# Patient Record
Sex: Female | Born: 1950 | Race: Black or African American | Hispanic: No | Smoking: Former smoker
Health system: Southern US, Community
[De-identification: ages and names within clinical notes are randomized; demographics above are authoritative.]

## PROBLEM LIST (undated history)

## (undated) DIAGNOSIS — E785 Hyperlipidemia, unspecified: Secondary | ICD-10-CM

## (undated) DIAGNOSIS — I2510 Atherosclerotic heart disease of native coronary artery without angina pectoris: Secondary | ICD-10-CM

## (undated) DIAGNOSIS — E669 Obesity, unspecified: Secondary | ICD-10-CM

## (undated) DIAGNOSIS — R52 Pain, unspecified: Secondary | ICD-10-CM

## (undated) DIAGNOSIS — H3530 Unspecified macular degeneration: Secondary | ICD-10-CM

## (undated) DIAGNOSIS — N814 Uterovaginal prolapse, unspecified: Secondary | ICD-10-CM

## (undated) DIAGNOSIS — M47812 Spondylosis without myelopathy or radiculopathy, cervical region: Secondary | ICD-10-CM

## (undated) DIAGNOSIS — N816 Rectocele: Secondary | ICD-10-CM

## (undated) DIAGNOSIS — M5030 Other cervical disc degeneration, unspecified cervical region: Secondary | ICD-10-CM

## (undated) DIAGNOSIS — K5909 Other constipation: Secondary | ICD-10-CM

## (undated) DIAGNOSIS — K2270 Barrett's esophagus without dysplasia: Secondary | ICD-10-CM

## (undated) DIAGNOSIS — M1990 Unspecified osteoarthritis, unspecified site: Secondary | ICD-10-CM

## (undated) DIAGNOSIS — F329 Major depressive disorder, single episode, unspecified: Secondary | ICD-10-CM

## (undated) DIAGNOSIS — I10 Essential (primary) hypertension: Secondary | ICD-10-CM

## (undated) DIAGNOSIS — M5412 Radiculopathy, cervical region: Secondary | ICD-10-CM

## (undated) DIAGNOSIS — H35039 Hypertensive retinopathy, unspecified eye: Secondary | ICD-10-CM

## (undated) DIAGNOSIS — G4730 Sleep apnea, unspecified: Secondary | ICD-10-CM

## (undated) DIAGNOSIS — K219 Gastro-esophageal reflux disease without esophagitis: Secondary | ICD-10-CM

## (undated) HISTORY — DX: Rectocele: N81.6

## (undated) HISTORY — DX: Other cervical disc degeneration, unspecified cervical region: M50.30

## (undated) HISTORY — DX: Hyperlipidemia, unspecified: E78.5

## (undated) HISTORY — DX: Unspecified osteoarthritis, unspecified site: M19.90

## (undated) HISTORY — PX: EYE SURGERY: SHX253

## (undated) HISTORY — DX: Spondylosis without myelopathy or radiculopathy, cervical region: M47.812

## (undated) HISTORY — PX: BREAST EXCISIONAL BIOPSY: SUR124

## (undated) HISTORY — DX: Barrett's esophagus without dysplasia: K22.70

## (undated) HISTORY — DX: Unspecified macular degeneration: H35.30

## (undated) HISTORY — PX: TUBAL LIGATION: SHX77

## (undated) HISTORY — DX: Gastro-esophageal reflux disease without esophagitis: K21.9

## (undated) HISTORY — DX: Essential (primary) hypertension: I10

## (undated) HISTORY — DX: Obesity, unspecified: E66.9

## (undated) HISTORY — PX: CATARACT EXTRACTION: SUR2

## (undated) HISTORY — PX: TONSILLECTOMY: SUR1361

## (undated) HISTORY — DX: Uterovaginal prolapse, unspecified: N81.4

## (undated) HISTORY — DX: Major depressive disorder, single episode, unspecified: F32.9

## (undated) HISTORY — DX: Radiculopathy, cervical region: M54.12

## (undated) HISTORY — DX: Hypertensive retinopathy, unspecified eye: H35.039

## (undated) HISTORY — DX: Pain, unspecified: R52

## (undated) HISTORY — DX: Atherosclerotic heart disease of native coronary artery without angina pectoris: I25.10

## (undated) HISTORY — DX: Other constipation: K59.09

## (undated) HISTORY — DX: Sleep apnea, unspecified: G47.30

---

## 1978-03-15 HISTORY — PX: BREAST BIOPSY: SHX20

## 1979-03-16 HISTORY — PX: TOE SURGERY: SHX1073

## 1999-03-16 HISTORY — PX: SCAR REVISION: SHX5285

## 2000-03-15 HISTORY — PX: TOE SURGERY: SHX1073

## 2004-03-11 ENCOUNTER — Ambulatory Visit: Payer: Self-pay | Admitting: Internal Medicine

## 2004-03-17 ENCOUNTER — Ambulatory Visit: Payer: Self-pay | Admitting: Internal Medicine

## 2004-04-07 ENCOUNTER — Ambulatory Visit: Payer: Self-pay | Admitting: *Deleted

## 2004-04-23 ENCOUNTER — Ambulatory Visit: Payer: Self-pay | Admitting: Internal Medicine

## 2004-04-23 ENCOUNTER — Other Ambulatory Visit: Admission: RE | Admit: 2004-04-23 | Discharge: 2004-04-23 | Payer: Self-pay | Admitting: Internal Medicine

## 2004-04-29 ENCOUNTER — Ambulatory Visit (HOSPITAL_COMMUNITY): Admission: RE | Admit: 2004-04-29 | Discharge: 2004-04-29 | Payer: Self-pay | Admitting: Family Medicine

## 2005-01-15 ENCOUNTER — Ambulatory Visit (HOSPITAL_COMMUNITY): Admission: RE | Admit: 2005-01-15 | Discharge: 2005-01-15 | Payer: Self-pay | Admitting: Gastroenterology

## 2005-01-27 ENCOUNTER — Ambulatory Visit: Payer: Self-pay | Admitting: Internal Medicine

## 2005-02-09 ENCOUNTER — Ambulatory Visit: Payer: Self-pay | Admitting: Internal Medicine

## 2005-03-10 ENCOUNTER — Ambulatory Visit: Payer: Self-pay | Admitting: Internal Medicine

## 2005-03-10 ENCOUNTER — Ambulatory Visit (HOSPITAL_BASED_OUTPATIENT_CLINIC_OR_DEPARTMENT_OTHER): Admission: RE | Admit: 2005-03-10 | Discharge: 2005-03-10 | Payer: Self-pay | Admitting: Internal Medicine

## 2005-03-14 ENCOUNTER — Ambulatory Visit: Payer: Self-pay | Admitting: Internal Medicine

## 2005-04-08 ENCOUNTER — Ambulatory Visit: Payer: Self-pay | Admitting: Internal Medicine

## 2005-05-12 ENCOUNTER — Ambulatory Visit (HOSPITAL_COMMUNITY): Admission: RE | Admit: 2005-05-12 | Discharge: 2005-05-13 | Payer: Self-pay | Admitting: Otolaryngology

## 2005-05-12 ENCOUNTER — Encounter (INDEPENDENT_AMBULATORY_CARE_PROVIDER_SITE_OTHER): Payer: Self-pay | Admitting: Specialist

## 2005-10-26 ENCOUNTER — Emergency Department (HOSPITAL_COMMUNITY): Admission: EM | Admit: 2005-10-26 | Discharge: 2005-10-27 | Payer: Self-pay | Admitting: Emergency Medicine

## 2005-12-23 ENCOUNTER — Ambulatory Visit (HOSPITAL_COMMUNITY): Admission: RE | Admit: 2005-12-23 | Discharge: 2005-12-23 | Payer: Self-pay | Admitting: Internal Medicine

## 2006-02-13 ENCOUNTER — Encounter: Admission: RE | Admit: 2006-02-13 | Discharge: 2006-02-13 | Payer: Self-pay | Admitting: Family Medicine

## 2006-09-04 ENCOUNTER — Encounter: Admission: RE | Admit: 2006-09-04 | Discharge: 2006-09-04 | Payer: Self-pay | Admitting: Orthopaedic Surgery

## 2006-12-20 ENCOUNTER — Observation Stay (HOSPITAL_COMMUNITY): Admission: EM | Admit: 2006-12-20 | Discharge: 2006-12-21 | Payer: Self-pay | Admitting: Emergency Medicine

## 2007-02-08 ENCOUNTER — Ambulatory Visit (HOSPITAL_COMMUNITY): Admission: RE | Admit: 2007-02-08 | Discharge: 2007-02-08 | Payer: Self-pay | Admitting: Internal Medicine

## 2007-02-16 ENCOUNTER — Encounter: Admission: RE | Admit: 2007-02-16 | Discharge: 2007-02-16 | Payer: Self-pay | Admitting: Internal Medicine

## 2007-05-01 ENCOUNTER — Emergency Department (HOSPITAL_COMMUNITY): Admission: EM | Admit: 2007-05-01 | Discharge: 2007-05-01 | Payer: Self-pay | Admitting: Emergency Medicine

## 2007-05-24 ENCOUNTER — Ambulatory Visit: Payer: Self-pay | Admitting: Gastroenterology

## 2007-06-06 ENCOUNTER — Ambulatory Visit: Payer: Self-pay | Admitting: Gastroenterology

## 2007-08-16 ENCOUNTER — Encounter: Admission: RE | Admit: 2007-08-16 | Discharge: 2007-08-16 | Payer: Self-pay | Admitting: Internal Medicine

## 2007-10-31 ENCOUNTER — Telehealth: Payer: Self-pay | Admitting: Gastroenterology

## 2007-11-17 DIAGNOSIS — K5730 Diverticulosis of large intestine without perforation or abscess without bleeding: Secondary | ICD-10-CM | POA: Insufficient documentation

## 2008-02-09 ENCOUNTER — Encounter: Admission: RE | Admit: 2008-02-09 | Discharge: 2008-02-09 | Payer: Self-pay | Admitting: Internal Medicine

## 2008-06-20 ENCOUNTER — Ambulatory Visit: Payer: Self-pay | Admitting: Gastroenterology

## 2008-07-09 ENCOUNTER — Encounter: Payer: Self-pay | Admitting: Gastroenterology

## 2008-07-09 ENCOUNTER — Ambulatory Visit: Payer: Self-pay | Admitting: Gastroenterology

## 2008-07-09 ENCOUNTER — Ambulatory Visit (HOSPITAL_COMMUNITY): Admission: RE | Admit: 2008-07-09 | Discharge: 2008-07-09 | Payer: Self-pay | Admitting: Gastroenterology

## 2008-07-12 ENCOUNTER — Ambulatory Visit (HOSPITAL_COMMUNITY): Admission: RE | Admit: 2008-07-12 | Discharge: 2008-07-12 | Payer: Self-pay | Admitting: Gastroenterology

## 2008-07-16 ENCOUNTER — Encounter: Payer: Self-pay | Admitting: Gastroenterology

## 2008-07-22 ENCOUNTER — Emergency Department (HOSPITAL_COMMUNITY): Admission: EM | Admit: 2008-07-22 | Discharge: 2008-07-22 | Payer: Self-pay | Admitting: Emergency Medicine

## 2008-08-07 ENCOUNTER — Ambulatory Visit: Payer: Self-pay | Admitting: Gastroenterology

## 2008-08-07 DIAGNOSIS — K219 Gastro-esophageal reflux disease without esophagitis: Secondary | ICD-10-CM | POA: Insufficient documentation

## 2008-08-07 DIAGNOSIS — R059 Cough, unspecified: Secondary | ICD-10-CM | POA: Insufficient documentation

## 2008-08-07 DIAGNOSIS — R05 Cough: Secondary | ICD-10-CM

## 2009-02-12 ENCOUNTER — Encounter: Admission: RE | Admit: 2009-02-12 | Discharge: 2009-02-12 | Payer: Self-pay | Admitting: Internal Medicine

## 2009-04-25 ENCOUNTER — Telehealth: Payer: Self-pay | Admitting: Gastroenterology

## 2009-05-30 ENCOUNTER — Encounter (INDEPENDENT_AMBULATORY_CARE_PROVIDER_SITE_OTHER): Payer: Self-pay | Admitting: *Deleted

## 2009-06-24 ENCOUNTER — Encounter (INDEPENDENT_AMBULATORY_CARE_PROVIDER_SITE_OTHER): Payer: Self-pay | Admitting: *Deleted

## 2009-07-10 ENCOUNTER — Ambulatory Visit: Payer: Self-pay | Admitting: Gastroenterology

## 2009-08-05 ENCOUNTER — Emergency Department (HOSPITAL_COMMUNITY): Admission: EM | Admit: 2009-08-05 | Discharge: 2009-08-06 | Payer: Self-pay | Admitting: Emergency Medicine

## 2009-08-07 ENCOUNTER — Ambulatory Visit: Payer: Self-pay | Admitting: Gastroenterology

## 2009-08-07 DIAGNOSIS — K2270 Barrett's esophagus without dysplasia: Secondary | ICD-10-CM | POA: Insufficient documentation

## 2009-08-20 ENCOUNTER — Ambulatory Visit: Payer: Self-pay | Admitting: Gastroenterology

## 2009-08-26 ENCOUNTER — Encounter: Payer: Self-pay | Admitting: Gastroenterology

## 2009-08-27 ENCOUNTER — Ambulatory Visit: Payer: Self-pay | Admitting: Pulmonary Disease

## 2009-08-27 DIAGNOSIS — F329 Major depressive disorder, single episode, unspecified: Secondary | ICD-10-CM | POA: Insufficient documentation

## 2009-08-27 DIAGNOSIS — R0602 Shortness of breath: Secondary | ICD-10-CM | POA: Insufficient documentation

## 2009-08-27 DIAGNOSIS — E785 Hyperlipidemia, unspecified: Secondary | ICD-10-CM | POA: Insufficient documentation

## 2009-08-27 DIAGNOSIS — I10 Essential (primary) hypertension: Secondary | ICD-10-CM | POA: Insufficient documentation

## 2009-08-27 DIAGNOSIS — G4730 Sleep apnea, unspecified: Secondary | ICD-10-CM | POA: Insufficient documentation

## 2010-02-27 ENCOUNTER — Encounter
Admission: RE | Admit: 2010-02-27 | Discharge: 2010-02-27 | Payer: Self-pay | Source: Home / Self Care | Attending: Family Medicine | Admitting: Family Medicine

## 2010-04-05 ENCOUNTER — Encounter: Payer: Self-pay | Admitting: Orthopaedic Surgery

## 2010-04-14 NOTE — Miscellaneous (Signed)
Summary: Lec previsit  Clinical Lists Changes  Appended Document: Lec previsit During previsit, pt states she would like to have "both procedures done at the same time."  Last colonoscopy was 05-2007.  Writer told pt that she would need to schedule an office appt to speak with Dr. Arlyce Dice re: having both procedures done.  Pt states that she would rather see him before having endoscopy done.  REV made for 08-07-09 at 9:45 a.m.  Pt agreeable to this.

## 2010-04-14 NOTE — Assessment & Plan Note (Signed)
Summary: STUDY F/U.Marland KitchenMarland KitchenAS.    History of Present Illness Visit Type: Follow-up Visit Primary GI MD: Melvia Heaps MD [facility] Primary Provider: Guerry Bruin, MD Chief Complaint: Study F/U Kylie Wilson has returned on upper endoscopy and esophageal dilatation.  Dysphagia is significantly improved.  She still complains of occasional pyrosis despite taking Nexium 40 mg twice a day.  Her main complaint is persistent nocturnal cough and hoarseness.  A gastric ulcer was seen as well.  The patient had been taking Goody powder on a regular basis.  She is concerned about a vocal cord polyp.  Gastric emptying scan was within normal limits.   GI Review of Systems      Denies abdominal pain, acid reflux, belching, bloating, chest pain, dysphagia with liquids, dysphagia with solids, heartburn, loss of appetite, nausea, vomiting, vomiting blood, weight loss, and  weight gain.        Denies anal fissure, black tarry stools, change in bowel habit, constipation, diarrhea, diverticulosis, fecal incontinence, heme positive stool, hemorrhoids, irritable bowel syndrome, jaundice, light color stool, liver problems, rectal bleeding, and  rectal pain.    Current Medications (verified): 1)  Metformin Hcl 500 Mg Tabs (Metformin Hcl) .... One Tablet By Mouth Once Daily 2)  Simvastatin 40 Mg Tabs (Simvastatin) .... One Tablet By Mouth Once Daily 3)  Nexium 40 Mg Cpdr (Esomeprazole Magnesium) .... Take 1 Capsule Two Times A Day 4)  Lisinopril 10 Mg Tabs (Lisinopril) .... One Tablet By Mouth Once Daily 5)  Bc Fast Pain Relief Arthritis 742-222-38 Mg Pack (Aspirin-Salicylamide-Caffeine) .... As Needed For Arthritis 6)  Amlodipine Besylate 5 Mg Tabs (Amlodipine Besylate) .... One Tablet By Mouth Once Daily 7)  Meloxicam 15 Mg Tabs (Meloxicam) .... Once Daily  Allergies (verified): No Known Drug Allergies  Past History:  Past Medical History: Reviewed history from 05/24/2007 and no changes  required. constipation Hypertension Diabetes DJD Depression Sleep Apnea  Past Surgical History: Tubal Ligation Breast Surgery Foot Surgery Tonsillectomy  Family History: Reviewed history from 06/20/2008 and no changes required. Family History of Breast Cancer:Aunt Family History of Stomach Cancer:Grandmother Family History of Pancreatic Cancer:Father No FH of Colon Cancer:  Social History: Married Patient currently smokes. 4 cigs weekly Alcohol Use - no Daily Caffeine Use Illicit Drug Use - no Occupation: Retired  Review of Systems       The patient complains of arthritis/joint pain, back pain, change in vision, cough, depression-new, fatigue, muscle pains/cramps, shortness of breath, skin rash, urination changes/pain, vision changes, and voice change.    Vital Signs:  Patient profile:   60 year old female Height:      64 inches Weight:      258.38 pounds BMI:     44.51 Pulse rate:   72 / minute Pulse rhythm:   regular BP sitting:   112 / 80  (right arm) Cuff size:   large  Vitals Entered By: June McMurray CMA (Aug 07, 2008 10:25 AM)   Impression & Recommendations:  Problem # 1:  COUGH (ICD-786.2) I suspect that her chronic cough could be related to acid reflux.  If this is so then  she is having continued symptoms despite high-dose PPI therapy.  Recommendations #1 continue Nexium twice a day #2 antireflux measures #3 add baclofen 10 mg t.i.d.  Problem # 2:  ESOPHAGEAL REFLUX (ICD-530.81) Symptoms  are  only partially controlled with PPI therapy.  Plan as per assessment #1  Patient Instructions: 1)  cc Guerry Bruin, M.D. 2)  GI Reflux brochure  given.  Prescriptions: BACLOFEN 10 MG TABS (BACLOFEN) take one tab t.i.d.  #45 x 2   Entered and Authorized by:   Louis Meckel MD   Signed by:   Louis Meckel MD on 08/07/2008   Method used:   Historical   RxID:   [id number]

## 2010-04-14 NOTE — Progress Notes (Signed)
Summary: Triage   Phone Note From Other Clinic Call back at [phone]   Caller: Debbie,front office Call For: Dr. Arlyce Dice Reason for Call: Schedule Patient Appt Summary of Call: Dr. Dione Booze needs a referral reason for pt... PT IS AT DR. Collier Salina OFFICE RIGHT NOW and they would like the referral asap Initial call taken by: Vallarie Mare,  April 25, 2009 9:45 AM  Follow-up for Phone Call        Dr.Groat is an Opthamologist. The pt. is there to be seen, states Dr.Mckynleigh Mussell referred her and they need a referral. The last time pt. saw Dr.Brevin Mcfadden was 07/2008, no indication in that note we referred her. I advised them to contact pt. PCP.  Follow-up by: Laureen Ochs LPN,  April 25, 2009 9:51 AM

## 2010-04-14 NOTE — Procedures (Signed)
Summary: Upper Endoscopy  Patient: Kylie Wilson Note: All result statuses are Final unless otherwise noted.  Tests: (1) Upper Endoscopy (EGD)   EGD Upper Endoscopy       DONE     [facility]     [address]     [place], [place]  [address]           ENDOSCOPY PROCEDURE REPORT           PATIENT:  Kylie Wilson, Kylie Wilson  MR#:  [id number]     BIRTHDATE:  [birth year], 59 yrs. old  GENDER:  female           ENDOSCOPIST:  Barbette Hair. Arlyce Dice, MD     Referred by:           PROCEDURE DATE:  08/20/2009     PROCEDURE:  EGD with biopsy     ASA CLASS:  Class II     INDICATIONS:  h/o Barrett's Esophagus           MEDICATIONS:   Fentanyl 75 mcg IV, Versed 7 mg IV, glycopyrrolate     (Robinal) 0.2 mg IV, 0.6cc simethancone 0.6 cc PO     TOPICAL ANESTHETIC:  Exactacain Spray           DESCRIPTION OF PROCEDURE:   After the risks benefits and     alternatives of the procedure were thoroughly explained, informed     consent was obtained.  The LB GIF-H180 [id number] endoscope was     introduced through the mouth and advanced to the third portion of     the duodenum, without limitations.  The instrument was slowly     withdrawn as the mucosa was fully examined.     <<PROCEDUREIMAGES>>           Barrett's esophagus was found at the gastroesophageal junction.     Established Barrett's extending 3cm proximally. Multiple biopsies     were taken (see image1, image4, and image5).  A stricture was     found at the gastroesophageal junction. Early esophageal stricture     (see image3).  A hiatal hernia was found. 3cm sliding hiatal     hernia  other findings.    Retroflexed views revealed no     abnormalities.    The scope was then withdrawn from the patient     and the procedure completed.           COMPLICATIONS:  None           ENDOSCOPIC IMPRESSION:     1) Barrett's esophagus at the gastroesophageal junction     2) Stricture at the gastroesophageal junction     3) Hiatal hernia     4) Other  findings     RECOMMENDATIONS:     1) Await biopsy results     2) esophageal dilitation if pt develops dysphagia           REPEAT EXAM:   You will receive a letter from Dr. Arlyce Dice in 1-2     weeks, after reviewing the final pathology, with followup     recommendations.           ______________________________     Barbette Hair Arlyce Dice, MD           CC:  Guerry Bruin, MD           n.     Rosalie Doctor:   Barbette Hair. Kaplan at 08/20/2009 04:03 PM  Venia, [place], [phone]  Note: An exclamation mark (!) indicates a result that was not dispersed into the flowsheet. Document Creation Date: 08/20/2009 4:03 PM _______________________________________________________________________  (1) Order result status: Final Collection or observation date-time: 08/20/2009 15:51 Requested date-time:  Receipt date-time:  Reported date-time:  Referring Physician:   Ordering Physician: Melvia Heaps [phone]) Specimen Source:  Source: Launa Grill Order Number: [id number] Lab site:   Appended Document: Upper Endoscopy     Procedures Next Due Date:    EGD: 08/2011

## 2010-04-14 NOTE — Letter (Signed)
Summary: Patient Notice-[facility]  [address] [place], [place] [address]   Phone: [phone]  Fax: [phone]        Jul 16, 2008 MRN: [id number]    Kylie Wilson [address] [place], [place]  [address]    Dear Ms. CANCELLIERE,  I am pleased to inform you that the biopsies taken during your recent endoscopic examination did not show any evidence of cancer upon pathologic examination.  However, your biopsies indicate you have a condition known as Barrett's esophagus. While not cancer, it is pre-cancerous (can progress to cancer) and needs to be monitored with repeat endoscopic examination and biopsies.  Fortunately, it is quite rare that this develops into cancer, but careful monitoring of the condition along with taking your medication as prescribed is important in reducing the risk of developing cancer.  It is my recommendation that you have a repeat upper gastrointestinal endoscopic examination in 1_ years.  Additional information/recommendations:  __Please call [phone] to schedule a return visit to further      evaluate your condition.  _x_Continue with treatment plan as outlined the day of your exam.  Please call us if you have or develop heartburn, reflux symptoms, any swallowing problems, or if you have questions about your condition that have not been fully answered at this time.  Sincerely,  Louis Meckel MD  This letter has been electronically signed by your physician.  Appended Document: Patient Notice-Barrett's Esopghagus Letter mailed to patient. Endo. recall is in IDX for 06/2009.

## 2010-04-14 NOTE — Miscellaneous (Signed)
Summary: Waiver of Liability/[place] [employer], industrial/product of Liability/[facility]   Imported By: Lester [place] 06/21/2008 07:54:51  _____________________________________________________________________  External Attachment:    Type:   Image     Comment:   External Document

## 2010-04-14 NOTE — Letter (Signed)
Summary: Endoscopy Letter  [facility]  [address] [place], [place] [address]   Phone: [phone]  Fax: [phone]      May 30, 2009 MRN: [id number]   Kylie Wilson [address] [place], [place]  [address]   Dear [name],   According to your medical record, it is time for you to schedule an Endoscopy. Endoscopic screening is recommended for patients with certain upper digestive tract conditions because of associated increased risk for cancers of the upper digestive system.  This letter has been generated based on the recommendations made at the time of your prior procedure. If you feel that in your particular situation this may no longer apply, please contact our office.  Please call our office at [phone]) to schedule this appointment or to update your records at your earliest convenience.  Thank you for cooperating with [place] to provide you with the very best care possible.   Sincerely,  Barbette Hair. Arlyce Dice, M.D.  [facility] [phone]

## 2010-04-14 NOTE — Assessment & Plan Note (Signed)
Summary: wheezing/self ref/apc   Visit Type:  Initial Consult Copy to:  self Primary Provider/Referring Provider:  Guerry Bruin, MD  CC:  Pt c/o chest pain x 1 month, wheezing, cough, and SOB all the time.  History of Present Illness: 59/F, 1 pPD smoker for evaluation of dyspnea, cough & hoarseness since dec 2010. She quit smoking in '99 but resumed due to psychological stress ( death of her mother etc) & is now upto 1PPD. She developed chest pain on lying down & underwent  ER evaluation . CXR 5/25 /11 nml She repors a dry nocturnal cough . She is limited in her walking by arthritis but has joined the [employer] to start an exercise program. She has been on lisinopril x 5 yrs. Med list includes metformin but hs edoes not take this. She ahs seen dr bates who has observed 'a polyp' on her vocal cords but does not feel it needs to be removed. Spirometry surprisingly showed nml lung function  Preventive Screening-Counseling & Management  Alcohol-Tobacco     Alcohol drinks/day: 1     Alcohol type: wine     Smoking Status: current     Packs/Day: 1.0     Year Started: 1964  Allergies (verified): No Known Drug Allergies  Past History:  Past Medical History: constipation DJD SLEEP APNEA (ICD-780.57) HYPERTENSION (ICD-401.9) DEPRESSION (ICD-311) HYPERLIPIDEMIA (ICD-272.4) BARRETTS ESOPHAGUS (ICD-530.85) ESOPHAGEAL REFLUX (ICD-530.81) COUGH (ICD-786.2) DIVERTICULOSIS OF COLON (ICD-562.10)    Past Surgical History: Tubal Ligation Breast Surgery Foot Surgery Tonsillectomy Bilateral scar revision on legs  Social History: Married, lives alone husband is in nursing home Alcohol Use - no Daily Caffeine Use Illicit Drug Use - no Occupation: Retired Writer Patient is a current smoker.  (1ppd 1964 on and off)Packs/Day:  1.0 Alcohol drinks/day:  1  Review of Systems       The patient complains of shortness of breath with activity, shortness of breath at rest, productive  cough, non-productive cough, chest pain, irregular heartbeats, acid heartburn, indigestion, loss of appetite, weight change, abdominal pain, tooth/dental problems, anxiety, depression, hand/feet swelling, joint stiffness or pain, and rash.  The patient denies coughing up blood, difficulty swallowing, sore throat, headaches, nasal congestion/difficulty breathing through nose, sneezing, itching, ear ache, change in color of mucus, and fever.    Vital Signs:  Patient profile:   60 year old female Height:      64 inches Weight:      265 pounds BMI:     45.65 O2 Sat:      98 % on Room air Temp:     98.6 degrees F oral Pulse rate:   67 / minute BP sitting:   140 / 82  (right arm) Cuff size:   large  Vitals Entered By: Zackery Barefoot CMA (August 27, 2009 9:45 AM)  O2 Flow:  Room air CC: Pt c/o chest pain x 1 month, wheezing, cough, SOB all the time Comments Medications reviewed with patient Verified contact number and pharmacy with patient Zackery Barefoot CMA  August 27, 2009 9:45 AM    Physical Exam  Additional Exam:  Gen. Pleasant, well-nourished, in no distress, normal affect ENT - no lesions, no post nasal drip, class 2 airway Neck: No JVD, no thyromegaly, no carotid bruits Lungs: no use of accessory muscles, no dullness to percussion, clear without rales or rhonchi  Cardiovascular: Rhythm regular, heart sounds  normal, no murmurs or gallops, no peripheral edema Abdomen: soft and non-tender, no hepatosplenomegaly, BS normal. Musculoskeletal: No deformities,  no cyanosis or clubbing Neuro:  alert, non focal     Pulmonary Function Test Date: 08/27/2009 10:19 AM Gender: Female  Pre-Spirometry FVC    Value: 2.41 L/min   % Pred: 90.50 % FEV1    Value: 1.91 L     Pred: 2.10 L     % Pred: 91.40 % FEV1/FVC  Value: 79.52 %     % Pred: 99.90 %  Impression & Recommendations:  Problem # 1:  DYSPNEA (ICD-786.05) Spirometry does  not show significant airway obstruction. Clearly smoking  cessation paramount here - this was discussed  - she does not desire aids here. CXR today Orders: Consultation Level IV (66063) Spirometry w/Graph (94010)  Problem # 2:  COUGH (ICD-786.2) stop lisinopril trial of diovan 80 - rechk BP in 1 wk & on FU. Hoarseness - has been evaluated by dr bates. Orders: T-2 View CXR (71020TC) Consultation Level IV (01601) Spirometry w/Graph (94010)  Patient Instructions: 1)  Copy sent to: Dr Wylene Simmer 2)  Please schedule a follow-up appointment in 1 month with TP 3)  Breathing test 4)  Albuterol MDI sample 2 puffs q 6h as needed  5)  STOP lisinopril  6)  Use valsartan 80 mg once daily instead - samples x 2 weeks, recheck your BP in 1 week  7)  STOP smoking   Card[facility] Spirometry  ID: [id number] Patient: Kylie Wilson, Kylie Wilson DOB: [birth year] Age: 60 Years Old Sex: Female Race: Black Height: 64 Weight: 265 PPD: 1.0 Status: Confirmed Past Medical History:  constipation Hypertension Diabetes DJD Depression Sleep Apnea Recorded: 08/27/2009 10:19 AM  Parameter  Measured Predicted %Predicted FVC     2.41        2.66        90.50 FEV1     1.91        2.10        91.40 FEV1%   79.52        79.62        99.90 PEF    5.08        5.63        90.20   Interpretation: Within normal limits

## 2010-04-14 NOTE — Letter (Signed)
Summary: EGD Instructions  [facility]  [address] [place], [place] [address]   Phone: [phone]  Fax: [phone]       Kylie Wilson    01-14-1951    MRN: [id number]       Procedure Day /Date:WEDNESDAY 08/20/2009     Arrival Time: 3PM     Procedure Time:4PM     Location of Procedure:                    X  [facility] (4th Floor)    PREPARATION FOR ENDOSCOPY   On6/10/2009  THE DAY OF THE PROCEDURE:  1.   No solid foods, milk or milk products are allowed after midnight the night before your procedure.  2.   Do not drink anything colored red or purple.  Avoid juices with pulp.  No orange juice.  3.  You may drink clear liquids until2PM, which is 2 hours before your procedure.                                                                                                CLEAR LIQUIDS INCLUDE: Water Jello Ice Popsicles Tea (sugar ok, no milk/cream) Powdered fruit flavored drinks Coffee (sugar ok, no milk/cream) Gatorade Juice: apple, white grape, white cranberry  Lemonade Clear bullion, consomm, broth Carbonated beverages (any kind) Strained chicken noodle soup Hard Candy   MEDICATION INSTRUCTIONS  Unless otherwise instructed, you should take regular prescription medications with a small sip of water as early as possible the morning of your procedure.  Diabetic patients - see separate instructions.            OTHER INSTRUCTIONS  You will need a responsible adult at least 60 years of age to accompany you and drive you home.   This person must remain in the waiting room during your procedure.  Wear loose fitting clothing that is easily removed.  Leave jewelry and other valuables at home.  However, you may wish to bring a book to read or an iPod/MP3 player to listen to music as you wait for your procedure to start.  Remove all body piercing jewelry and leave at home.  Total time from sign-in until discharge is approximately  2-3 hours.  You should go home directly after your procedure and rest.  You can resume normal activities the day after your procedure.  The day of your procedure you should not:   Drive   Make legal decisions   Operate machinery   Drink alcohol   Return to work  You will receive specific instructions about eating, activities and medications before you leave.    The above instructions have been reviewed and explained to me by   _______________________    I fully understand and can verbalize these instructions _____________________________ Date _________

## 2010-04-14 NOTE — Assessment & Plan Note (Signed)
Summary: YEARLY F-UP/YF    History of Present Illness Visit Type: Follow-up Visit Primary GI MD: Melvia Heaps MD [facility] Primary Provider: Guerry Bruin, MD Kylie Wilson has returned for evaluation of dysphagia.  She has developed dysphagia to both solids and liquids..  She reports abdominal bloating, nausea and some pyrosis.  She was also informed that she has a polyp in her throat as determined by her ENT physician, Dr. Jenne Pane.   GI Review of Systems    Reports dysphagia with liquids and  dysphagia with solids.      Denies abdominal pain, acid reflux, belching, bloating, chest pain, heartburn, loss of appetite, nausea, vomiting, vomiting blood, weight loss, and  weight gain.        Denies anal fissure, black tarry stools, change in bowel habit, constipation, diarrhea, diverticulosis, fecal incontinence, heme positive stool, hemorrhoids, irritable bowel syndrome, jaundice, light color stool, liver problems, rectal bleeding, and  rectal pain. Preventive Screening-Counseling & Management     Smoking Status: current      Drug Use:  no.      Current Medications (verified): 1)  Metformin Hcl 500 Mg Tabs (Metformin Hcl) .... One Tablet By Mouth Once Daily 2)  Simvastatin 40 Mg Tabs (Simvastatin) .... One Tablet By Mouth Once Daily 3)  Pantoprazole Sodium 40 Mg Tbec (Pantoprazole Sodium) .... One Tablet By Mouth Two Times A Day 4)  Lisinopril 10 Mg Tabs (Lisinopril) .... One Tablet By Mouth Once Daily 5)  Bc Fast Pain Relief Arthritis [id number] Mg Pack (Aspirin-Salicylamide-Caffeine) .... As Needed For Arthritis 6)  Amlodipine Besylate 5 Mg Tabs (Amlodipine Besylate) .... One Tablet By Mouth Once Daily  Allergies (verified): No Known Drug Allergies  Past History:  Past Medical History:    Reviewed history from 05/24/2007 and no changes required:    constipation    Hypertension    Diabetes    DJD    Depression    Sleep Apnea  Past Surgical History:    Reviewed history from  05/24/2007 and no changes required:    Tubal Ligation    Breast Surgery  Family History:    Family History of Breast Cancer:Aunt    Family History of Stomach Cancer:Grandmother    Family History of Pancreatic Cancer:Father    No FH of Colon Cancer:  Social History:    Married    Patient currently smokes.     Alcohol Use - no    Daily Caffeine Use    Illicit Drug Use - no    Smoking Status:  current    Drug Use:  no  Review of Systems  The patient denies allergy/sinus, anemia, anxiety-new, arthritis/joint pain, back pain, blood in urine, breast changes/lumps, change in vision, confusion, cough, coughing up blood, depression-new, fainting, fatigue, fever, headaches-new, hearing problems, heart murmur, heart rhythm changes, itching, menstrual pain, muscle pains/cramps, night sweats, nosebleeds, pregnancy symptoms, shortness of breath, skin rash, sleeping problems, sore throat, swelling of feet/legs, swollen lymph glands, thirst - excessive , urination - excessive , urination changes/pain, urine leakage, vision changes, and voice change.    Vital Signs:  Patient profile:   60 year old female Height:      64 inches Weight:      259.38 pounds BMI:     44.68 Pulse rate:   76 / minute Pulse rhythm:   regular BP sitting:   142 / 80  (right arm) Cuff size:   large  Vitals Entered By: Christie Nottingham CMA (June 20, 2008 11:51  AM)  Physical Exam  Additional Exam:  She is a well-developed well-nourished female  skin: anicteric HEENT: normocephalic; PEERLA; no nasal or pharyngeal abnormalities neck: supple nodes: no cervical lymphadenopathy chest: clear to ausculatation and percussion heart: no murmurs, gallops, or rubs abd: soft, nontender; BS normoactive; no abdominal masses, tenderness, organomegaly; there is no succussion splash rectal: deferred ext: no cynanosis, clubbing, edema skeletal: no deformities neuro: oriented x 3; no focal abnormalities    Impression &  Recommendations:  Problem # 1:  DYSPHAGIA UNSPECIFIED (ICD-787.20)  I suspect that she has an  esophageal stricture.  Recommendations #1 upper endoscopy with dilatation as indicated  Orders: ZEGD (ZEGD) Gastric Emptying Scan (GES)  Problem # 2:  NAUSEA ALONE (ICD-787.02) Rule out gastroparesis  Recommendations #1 gastric emptying scan  Orders: Gastric Emptying Scan (GES)  Patient Instructions: 1)  cc Guerry Bruin, M.D. 2)  cc Dr. Jenne Pane 3)  Your EGD is scheduled for 07/09/2008 at [facility] 4)  Your GES is scheduled for 07/12/2008 at 8:00am 5)  The medication list was reviewed and reconciled.  All changed / newly prescribed medications were explained.  A complete medication list was provided to the patient / caregiver.

## 2010-04-14 NOTE — Procedures (Signed)
Summary: ENDO Prep/[facility]  ENDO Prep/[facility]   Imported By: Lester [place] 06/21/2008 07:53:23  _____________________________________________________________________  External Attachment:    Type:   Image     Comment:   External Document

## 2010-04-14 NOTE — Procedures (Signed)
Summary: colonoscopy   Colonoscopy  Procedure date:  06/06/2007  Findings:      Results: Diverticulosis.       Location:  [facility].   Patient Name: Kylie Wilson, Kylie Wilson MRN:  Procedure Procedures: Colonoscopy CPT: 83382.  Personnel: Endoscopist: Barbette Hair. Arlyce Dice, MD. Cecum to Rectum. Comments: otherwise normal examination.  Exam Location: Outpatient  Patient Consent: Procedure, Alternatives, Risks and Benefits discussed, consent obtained, from patient.  Indications Symptoms: Abdominal pain / bloating. Change in bowel habits.  History  Current Medications: Patient is not currently taking Coumadin.  Pre-Exam Physical: Performed Jun 06, 2007. Cardio-pulmonary exam, HEENT exam , Abdominal exam, Mental status exam WNL.  Comments: Patient history reviewed/updated, physical performed prior to initiation of sedation?yes Exam Exam: Extent of exam reached: Ileum, extent intended: Cecum.  The cecum was identified by appendiceal orifice and IC valve. Time to Cecum: 00:03: 21. Time for Withdrawl: 00:05:15. Colon retroflexion performed. ASA Classification: II. Tolerance: good.  Monitoring: Pulse and BP monitoring, Oximetry used. Supplemental O2 given. at 2 Liters.  Colon Prep Used Miralax for colon prep. Prep results: good.  Sedation Meds: Patient assessed and found to be appropriate for moderate (conscious) sedation. Sedation was managed by the Endoscopist. Fentanyl 75 mcg. given IV. Versed 9 mg. given IV.  Findings - DIVERTICULOSIS: Descending Colon. ICD9: Diverticulosis: 562.10. Comments: Scattered diverticula.  - NORMAL EXAM: Cecum to Splenic Flexure.  NORMAL EXAM: Cecum.  - NORMAL EXAM: Sigmoid Colon to Rectum.   Assessment Abnormal examination, see findings above.  Diagnoses: 562.10: Diverticulosis.   Events  Unplanned Interventions: No intervention was required.  Unplanned Events: There were no complications. Plans  Post Exam  Instructions: Post sedation instructions given.  Medication Plan: Fiber supplements: Bran 1 Tbsp QD, starting Jun 06, 2007   Patient Education: Patient given standard instructions for: Diverticulosis.  Disposition: After procedure patient sent to recovery. After recovery patient sent home.  Scheduling/Referral: Office Visit, to [employer]. Arlyce Dice, MD, around Jun 27, 2007.    This report was created from the original endoscopy report, which was reviewed and signed by the above listed endoscopist.

## 2010-04-14 NOTE — Letter (Signed)
Summary: Diabetic Instructions  [facility]  [address] [place], [place] [address]   Phone: [phone]  Fax: [phone]    FERRIN LIEBIG 1950-04-17 MRN: [id number]   X    ORAL DIABETIC MEDICATION INSTRUCTIONS                                           METFORMIN The day before your procedure:   Take your diabetic pill as you do normally  The day of your procedure:   Do not take your diabetic pill    We will check your blood sugar levels during the admission process and again in Recovery before discharging you home  ________________________________________________________________________  _  _   INSULIN (LONG ACTING) MEDICATION INSTRUCTIONS (Lantus, NPH, 70/30, Humulin, Novolin-N)   The day before your procedure:   Take  your regular evening dose    The day of your procedure:   Do not take your morning dose    _  _   INSULIN (SHORT ACTING) MEDICATION INSTRUCTIONS (Regular, Humulog, Novolog)   The day before your procedure:   Do not take your evening dose   The day of your procedure:   Do not take your morning dose   _  _   INSULIN PUMP MEDICATION INSTRUCTIONS  We will contact the physician managing your diabetic care for written dosage instructions for the day before your procedure and the day of your procedure.  Once we have received the instructions, we will contact you.

## 2010-04-14 NOTE — Letter (Signed)
Summary: Patient Notice-[facility]  [address] [place], [place] [address]   Phone: [phone]  Fax: [phone]        August 26, 2009 MRN: [id number]    Kylie Wilson [address] [place], [place]  [address]    Dear Ms. BLACKSTON,  I am pleased to inform you that the biopsies taken during your recent endoscopic examination did not show any evidence of cancer upon pathologic examination.  However, your biopsies indicate you have a condition known as Barrett's esophagus. While not cancer, it is pre-cancerous (can progress to cancer) and needs to be monitored with repeat endoscopic examination and biopsies.  Fortunately, it is quite rare that this develops into cancer, but careful monitoring of the condition along with taking your medication as prescribed is important in reducing the risk of developing cancer.  It is my recommendation that you have a repeat upper gastrointestinal endoscopic examination in 2_ years.  Additional information/recommendations:  __Please call [phone] to schedule a return visit to further      evaluate your condition.  __xContinue with treatment plan as outlined the day of your exam.  Please call us if you have or develop heartburn, reflux symptoms, any swallowing problems, or if you have questions about your condition that have not been fully answered at this time.  Sincerely,  Louis Meckel MD  This letter has been electronically signed by your physician.  Appended Document: Patient Notice-Barrett's Esopghagus letter mailed.

## 2010-04-14 NOTE — Letter (Signed)
Summary: Previsit letter  [facility]  [address] [place], [place] [address]   Phone: [phone]  Fax: [phone]       06/24/2009 MRN: [id number]  Kylie Wilson [address] [place], [place]  [address]  Dear Ms. KIPPER,  Welcome to the Gastroenterology Division at [facility].    You are scheduled to see a nurse for your pre-procedure visit on 07-10-09 at 10:30a.m. on the 3rd floor at [facility], [address]  We ask that you try to arrive at our office 15 minutes prior to your appointment time to allow for check-in.  Your nurse visit will consist of discussing your medical and surgical history, your immediate family medical history, and your medications.    Please bring a complete list of all your medications or, if you prefer, bring the medication bottles and we will list them.  We will need to be aware of both prescribed and over the counter drugs.  We will need to know exact dosage information as well.  If you are on blood thinners (Coumadin, Plavix, Aggrenox, Ticlid, etc.) please call our office today/prior to your appointment, as we need to consult with your physician about holding your medication.   Please be prepared to read and sign documents such as consent forms, a financial agreement, and acknowledgement forms.  If necessary, and with your consent, a friend or relative is welcome to sit-in on the nurse visit with you.  Please bring your insurance card so that we may make a copy of it.  If your insurance requires a referral to see a specialist, please bring your referral form from your primary care physician.  No co-pay is required for this nurse visit.     If you cannot keep your appointment, please call [phone] to cancel or reschedule prior to your appointment date.  This allows [place] the opportunity to schedule an appointment for another patient in need of care.    Thank you for choosing [facility] for your medical  needs.  We appreciate the opportunity to care for you.  Please visit [place] at our website  to learn more about our practice.                     Sincerely.                                                                                                                   The Gastroenterology Division

## 2010-04-14 NOTE — Assessment & Plan Note (Signed)
Summary: rev/kw    History of Present Illness Visit Type: Follow-up Visit Primary GI MD: Melvia Heaps MD [facility] Primary Provider: Guerry Bruin, MD Requesting Provider: na Chief Complaint: Discuss endo/colon. Pt c/o constipation  History of Present Illness:   Ms. Silberstein has returned for followup of abdominal discomfort.  She has diverticulosis as demonstrated by colonoscopy in 2009.  She complains of constipation and lower abdominal discomfort.  It is partially relieved when she moves her bowels.  Barrett's esophagus was diagnosed a year ago.  She remains on Nexium.  She takes [facility] powder several times a day for arthritic pain.   GI Review of Systems      Denies abdominal pain, acid reflux, belching, bloating, chest pain, dysphagia with liquids, dysphagia with solids, heartburn, loss of appetite, nausea, vomiting, vomiting blood, weight loss, and  weight gain.      Reports constipation.     Denies anal fissure, black tarry stools, change in bowel habit, diarrhea, diverticulosis, fecal incontinence, heme positive stool, hemorrhoids, irritable bowel syndrome, jaundice, light color stool, liver problems, rectal bleeding, and  rectal pain.    Current Medications (verified): 1)  Metformin Hcl 500 Mg Tabs (Metformin Hcl) .... One Tablet By Mouth Once Daily 2)  Simvastatin 40 Mg Tabs (Simvastatin) .... One Tablet By Mouth Once Daily 3)  Nexium 40 Mg Cpdr (Esomeprazole Magnesium) .... Take 1 Capsule Two Times A Day 4)  Lisinopril 10 Mg Tabs (Lisinopril) .... One Tablet By Mouth Once Daily 5)  Bc Fast Pain Relief Arthritis [phone] Mg Pack (Aspirin-Salicylamide-Caffeine) .... As Needed For Arthritis 6)  Amlodipine Besylate 5 Mg Tabs (Amlodipine Besylate) .... One Tablet By Mouth Once Daily 7)  Hydrochlorothiazide 25 Mg Tabs (Hydrochlorothiazide) .... 1/2 Tablet By Mouth Once Daily  Allergies (verified): No Known Drug Allergies  Past History:  Past Medical History: Reviewed history from  05/24/2007 and no changes required. constipation Hypertension Diabetes DJD Depression Sleep Apnea  Past Surgical History: Reviewed history from 08/07/2008 and no changes required. Tubal Ligation Breast Surgery Foot Surgery Tonsillectomy  Family History: Reviewed history from 06/20/2008 and no changes required. Family History of Breast Cancer:Aunt Family History of Stomach Cancer:Grandmother Family History of Pancreatic Cancer:Father No FH of Colon Cancer:  Social History: Reviewed history from 08/07/2008 and no changes required. Married Patient currently smokes. 4 cigs weekly Alcohol Use - no Daily Caffeine Use Illicit Drug Use - no Occupation: Retired  Review of Systems       The patient complains of swelling of feet/legs.  The patient denies allergy/sinus, anemia, anxiety-new, arthritis/joint pain, back pain, blood in urine, breast changes/lumps, change in vision, confusion, cough, coughing up blood, depression-new, fainting, fatigue, fever, headaches-new, hearing problems, heart murmur, heart rhythm changes, itching, menstrual pain, muscle pains/cramps, night sweats, nosebleeds, pregnancy symptoms, shortness of breath, skin rash, sleeping problems, sore throat, swollen lymph glands, thirst - excessive , urination - excessive , urination changes/pain, urine leakage, vision changes, and voice change.    Vital Signs:  Patient profile:   60 year old female Height:      64 inches Weight:      262 pounds BMI:     45.13 BSA:     2.20 Pulse rate:   76 / minute Pulse rhythm:   regular BP sitting:   128 / 82  (left arm) Cuff size:   large  Vitals Entered By: Ok Anis CMA (Aug 07, 2009 9:51 AM)  Physical Exam  Additional Exam:  On physical exam she is an obese  female  skin: anicteric HEENT: normocephalic; PEERLA; no nasal or pharyngeal abnormalities neck: supple nodes: no cervical lymphadenopathy chest: clear to ausculatation and percussion heart: no murmurs,  gallops, or rubs abd: soft, nontender; BS normoactive; no abdominal masses, tenderness, organomegaly rectal: deferred ext: no cynanosis, clubbing, edema skeletal: no deformities neuro: oriented x 3; no focal abnormalities    Impression & Recommendations:  Problem # 1:  DIVERTICULOSIS OF COLON (ICD-562.10) Patient's abdominal pain is probably related to combination of constipation and diverticular disease.  The latter  may be contributing to her constipation.  Recommendations #1 MiraLax every 2-3 days as needed #2 hyomax p.r.n.  Problem # 2:  BARRETTS ESOPHAGUS (ICD-530.85)  Plan followup endoscopy to rule out dysplasia  Orders: EGD (EGD)  Problem # 3:  ESOPHAGEAL REFLUX (ICD-530.81)  Orders: EGD (EGD)  Patient Instructions: 1)  Copy sent to : Richard Tisovec,MD 2)  Your EGD is scheduled for 08/20/2009 at 4pm on the 4th floor 3)  You can pick up your new prescription from your pharmacy today 4)  The medication list was reviewed and reconciled.  All changed / newly prescribed medications were explained.  A complete medication list was provided to the patient / caregiver. Prescriptions: HYOMAX-SR 0.375 MG XR12H-TAB (HYOSCYAMINE SULFATE) take one tab b.i.d. as needed for abdominal pain  #25 x 1   Entered and Authorized by:   Louis Meckel MD   Signed by:   Louis Meckel MD on 08/07/2009   Method used:   Electronically to        [facility] (retail)       [address]       [place], [place]  [address]       Ph: [phone]       Fax: [phone]   RxID:   [id number]

## 2010-05-29 ENCOUNTER — Emergency Department (HOSPITAL_COMMUNITY)
Admission: EM | Admit: 2010-05-29 | Discharge: 2010-05-30 | Disposition: A | Discharge status: Home / Self Care | Payer: Medicare Other | Attending: Emergency Medicine | Admitting: Emergency Medicine

## 2010-05-29 DIAGNOSIS — E7800 Pure hypercholesterolemia, unspecified: Secondary | ICD-10-CM | POA: Insufficient documentation

## 2010-05-29 DIAGNOSIS — K047 Periapical abscess without sinus: Secondary | ICD-10-CM | POA: Insufficient documentation

## 2010-05-29 DIAGNOSIS — Z79899 Other long term (current) drug therapy: Secondary | ICD-10-CM | POA: Insufficient documentation

## 2010-05-29 DIAGNOSIS — G8929 Other chronic pain: Secondary | ICD-10-CM | POA: Insufficient documentation

## 2010-05-29 DIAGNOSIS — F17200 Nicotine dependence, unspecified, uncomplicated: Secondary | ICD-10-CM | POA: Insufficient documentation

## 2010-05-29 DIAGNOSIS — E119 Type 2 diabetes mellitus without complications: Secondary | ICD-10-CM | POA: Insufficient documentation

## 2010-05-29 DIAGNOSIS — I10 Essential (primary) hypertension: Secondary | ICD-10-CM | POA: Insufficient documentation

## 2010-05-29 DIAGNOSIS — K029 Dental caries, unspecified: Secondary | ICD-10-CM | POA: Insufficient documentation

## 2010-06-01 LAB — URINALYSIS, ROUTINE W REFLEX MICROSCOPIC
Bilirubin Urine: NEGATIVE
Glucose, UA: NEGATIVE mg/dL
Hgb urine dipstick: NEGATIVE
Ketones, ur: NEGATIVE mg/dL
Nitrite: NEGATIVE
Protein, ur: NEGATIVE mg/dL
Specific Gravity, Urine: 1.03 (ref 1.005–1.030)
Urobilinogen, UA: 0.2 mg/dL (ref 0.0–1.0)
pH: 5 (ref 5.0–8.0)

## 2010-06-01 LAB — COMPREHENSIVE METABOLIC PANEL
ALT: 15 U/L (ref 0–35)
AST: 23 U/L (ref 0–37)
Albumin: 3.3 g/dL — ABNORMAL LOW (ref 3.5–5.2)
Alkaline Phosphatase: 116 U/L (ref 39–117)
BUN: 8 mg/dL (ref 6–23)
CO2: 25 mEq/L (ref 19–32)
Calcium: 8.5 mg/dL (ref 8.4–10.5)
Chloride: 107 mEq/L (ref 96–112)
Creatinine, Ser: 0.66 mg/dL (ref 0.4–1.2)
GFR calc Af Amer: >60 mL/min (ref >60)
GFR calc non Af Amer: >60 mL/min (ref >60)
Glucose, Bld: 119 mg/dL — ABNORMAL HIGH (ref 70–99)
Potassium: 4.3 mEq/L (ref 3.5–5.1)
Sodium: 139 mEq/L (ref 135–145)
Total Bilirubin: 0.1 mg/dL — ABNORMAL LOW (ref 0.3–1.2)
Total Protein: 7 g/dL (ref 6.0–8.3)

## 2010-06-01 LAB — CBC
HCT: 35.9 % — ABNORMAL LOW (ref 36.0–46.0)
Hemoglobin: 11.5 g/dL — ABNORMAL LOW (ref 12.0–15.0)
MCHC: 32 g/dL (ref 30.0–36.0)
MCV: 84.7 fL (ref 78.0–100.0)
Platelets: 185 10*3/uL (ref 150–400)
RBC: 4.24 MIL/uL (ref 3.87–5.11)
RDW: 16.5 % — ABNORMAL HIGH (ref 11.5–15.5)
WBC: 7.4 10*3/uL (ref 4.0–10.5)

## 2010-06-01 LAB — DIFFERENTIAL
Basophils Absolute: 0 10*3/uL (ref 0.0–0.1)
Basophils Relative: 0 % (ref 0–1)
Eosinophils Absolute: 0.2 10*3/uL (ref 0.0–0.7)
Eosinophils Relative: 2 % (ref 0–5)
Lymphocytes Relative: 31 % (ref 12–46)
Lymphs Abs: 2.3 10*3/uL (ref 0.7–4.0)
Monocytes Absolute: 0.4 10*3/uL (ref 0.1–1.0)
Monocytes Relative: 5 % (ref 3–12)
Neutro Abs: 4.6 10*3/uL (ref 1.7–7.7)
Neutrophils Relative %: 62 % (ref 43–77)

## 2010-06-01 LAB — TROPONIN I: Troponin I: 0.01 ng/mL (ref 0.00–0.06)

## 2010-06-01 LAB — CK TOTAL AND CKMB (NOT AT ARMC)
CK, MB: 2.3 ng/mL (ref 0.3–4.0)
Relative Index: 1.8 (ref 0.0–2.5)
Total CK: 128 U/L (ref 7–177)

## 2010-06-01 LAB — GLUCOSE, CAPILLARY
Glucose-Capillary: 105 mg/dL — ABNORMAL HIGH (ref 70–99)
Glucose-Capillary: 89 mg/dL (ref 70–99)

## 2010-06-01 LAB — D-DIMER, QUANTITATIVE (NOT AT ARMC): D-Dimer, Quant: <0.22 ug/mL-FEU (ref 0.00–0.48)

## 2010-06-23 LAB — POCT I-STAT, CHEM 8
BUN: 10 mg/dL (ref 6–23)
Calcium, Ion: 1.17 mmol/L (ref 1.12–1.32)
Chloride: 106 mEq/L (ref 96–112)
Creatinine, Ser: 0.9 mg/dL (ref 0.4–1.2)
Glucose, Bld: 119 mg/dL — ABNORMAL HIGH (ref 70–99)
HCT: 39 % (ref 36.0–46.0)
Hemoglobin: 13.3 g/dL (ref 12.0–15.0)
Potassium: 3.6 mEq/L (ref 3.5–5.1)
Sodium: 141 mEq/L (ref 135–145)
TCO2: 26 mmol/L (ref 0–100)

## 2010-06-23 LAB — POCT CARDIAC MARKERS
CKMB, poc: 1.6 ng/mL (ref 1.0–8.0)
Myoglobin, poc: 61.9 ng/mL (ref 12–200)
Troponin i, poc: <0.05 ng/mL (ref 0.00–0.09)

## 2010-06-24 LAB — GLUCOSE, CAPILLARY: Glucose-Capillary: 114 mg/dL — ABNORMAL HIGH (ref 70–99)

## 2010-07-28 NOTE — Assessment & Plan Note (Signed)
[facility]                         GASTROENTEROLOGY OFFICE NOTE   LERLENE, TREADWELL                      MRN:          [id number]  DATE:05/24/2007                            DOB:          [birth year]    PROBLEM:  Abdominal distension and change in bowel habits.   Ms. Bias is a pleasant, 60 year old African American female,  complaining of early postprandial fullness with distension.  She has  been suffering from chronic constipation over the last year.  She now  requires prune juice to have a bowel movement.  She rarely has seen  blood on the toilet tissue with straining.  There is no history of  melena.  She denies abdominal pain, per se, pyrosis, or nausea.   PAST MEDICAL HISTORY:  Pertinent for diabetes and hypertension.  She has  arthritis, sleep apnea, and suffers from depression.  She is status post  tubal ligation and breast surgery.   FAMILY HISTORY:  Noncontributory.   MEDICATIONS:  Include Metformin, hydrochlorothiazide, Simvastatin, iron,  and lisinopril.   ALLERGIES:  SHE HAS NO ALLERGIES.   SOCIAL HISTORY:  She smokes a pack a day.  She does not drink.  She is  married and retired.   REVIEW OF SYSTEMS:  Positive for frequent cough, joint pains, night  sweats, back pain, fatigue, and excessive urination.   PHYSICAL EXAMINATION:  On exam pulse 88.  Blood pressure 134/82, weight  250.  HEENT: EOMI.  PERRLA.  Sclerae are anicteric.  Conjunctivae are pink.  NECK:  Supple without thyromegaly, adenopathy or carotid bruits.  CHEST: Clear to auscultation and percussion without adventitious sounds.  CARDIAC:  Regular rhythm; normal S1 S2.  There are no murmurs, gallops  or rubs.  ABDOMEN:  She has a small subcutaneous nodule just superior to her  umbilicus, consistent with a small lipoma.  Bowel sounds are  normoactive.  Abdomen is soft, nontender and nondistended.  There are no  abdominal masses, tenderness, splenic enlargement or  hepatomegaly.  EXTREMITIES:  Full range of motion.  No cyanosis, clubbing or edema.  RECTAL:  Deferred.   IMPRESSION:  1. Change in bowel habits.  I suspect this is underlying her abdominal      distension.  A colonic lesion ought to be ruled out.  2. Abdominal distension and early satiety.  This could be related to      number 1.  Gastroparesis is another consideration.  3. Diabetes.  4. Hypertension.   RECOMMENDATION:  1. Colonoscopy.  2. Fiber supplementation.  3. If symptoms persist, once her bowels are moving regularly, I would      consider upper endoscopy and possible gastric emptying scan.     Barbette Hair. Arlyce Dice, MD,FACG  Electronically Signed    RDK/MedQ  DD: 05/24/2007  DT: 05/24/2007  Job #: [id number]   cc:   Gaspar Garbe, M.D.

## 2010-07-28 NOTE — H&P (Signed)
NAME:  CHARELL, FAULK NO.:  [id number]   MEDICAL RECORD NO.:  [id number]          PATIENT TYPE:  OBV   LOCATION:  1441                         FACILITY:  [facility]   PHYSICIAN:  Gaspar Garbe, M.D.DATE OF BIRTH:  01-14-1951   DATE OF ADMISSION:  12/20/2006  DATE OF DISCHARGE:                              HISTORY & PHYSICAL   CHIEF COMPLAINT:  Chest pain.   HISTORY OF PRESENT ILLNESS:  The patient is a 60 year old black female  who is new to my practice from this year.  She has a history of  hypertension, hyperlipidemia, and diabetes mellitus type 2.  All of  which have been relatively controlled except for her blood pressure  running up lately.  She indicates that she has been under a lot of  stress.  She has a husband who is paraplegic, and is also caring for  grandchildren and her great-grandchildren in some capacity.  She  indicates that she had been having chest pain since Friday, which had  been stuttering in nature, mostly worse with cough and moving; but this  morning, she was having chest pain that was not going away, so she went  to the emergency room at [facility].  She was evaluated by the  ER physician, and I was asked to come to admit the patient for possible  cardiac etiology.  The patient has not taken any of her Ultram which she  normally uses for her degenerative disease in her neck or spine, and has  not had any relief with any other means of pain relief.  She has been  taking her Protonix although she indicates that she is on a different  dose than what she normally is, or a different generic of some sort.  She did not bring her pills with her.  She indicates that, now, she is  currently having chest pain.  She has been given nitroglycerin which has  not completely relieved her, and we are waiting for a GI, cocktail to  come up from the pharmacy.  She does not have any family at her bedside.   ALLERGIES:  No known drug  allergies.   MEDICATIONS:  1. Lisinopril 10 mg b.i.d.  2. Metformin 500 mg daily.  3. Hydrochlorothiazide 25 mg daily.  4. Zocor 40 mg daily.  5. Protonix 40 mg daily.  6. Valium 1 mg t.i.d. p.r.n. anxiety.  7. Elavil 25 mg p.o. nightly  p.r.n. for sleep or anxiety.  8. Ultram 50 mg q.4 h. p.r.n. for pain.   PAST MEDICAL HISTORY:  1. Obstructive sleep apnea status post surgery.  2. DJD of C-spine and L-spine.  3. Diabetes mellitus type 2 with most recent hemoglobin A1c 6.0 on      monotherapy.  4. Anxiety.  The patient has a history of a nervous breakdown in 1999      which required hospitalization.  5. Hypertension.  6. Hyperlipidemia.  7. GERD.  8. History of DVT status post 6 months of Coumadin.  I do not have      records to corroborate this as  she was under the care of a      different physician.   PAST SURGICAL HISTORY:  The patient had sleep apnea surgery by Dr.  Haroldine Laws in May of last year.  She has also had breast biopsies and some  sort of procedure to repair scars in her legs.   SOCIAL HISTORY:  The patient lives in [place] with her husband and  extended family.  She is currently out on disability.  She is a  nonsmoker, nondrinker, and is married with three children.   FAMILY HISTORY:  Mother died at age 75 of a stroke, also had  hypertension.  She had a father who died of pancreatic cancer at age 14.  She has had a brother who died of a gunshot wound and another who died  of pneumocystis carinii pneumonia related to HIV.   REVIEW OF SYSTEMS:  The patient denies any fevers, chills, sweats,  headaches, or any sore throat, nasal discharges, skin rashes, or  lesions.  She indicates that she is currently having chest pain which  she rates as 5/10.  She denies any shortness of breath or dyspnea on  exertion.  At this point, no cough or wheezing.  No urinary symptoms.  Indicates that her reflux seems to be a little bit worse, but has had no  nausea and vomiting  along with it, and has chronic myalgias, and  arthralgias in her neck, hips, and lower back.  All other review of  systems are negative on a 10-point review.   CODE STATUS:  The patient is full code.   PHYSICAL EXAM:  VITAL SIGNS:  Temperature 97.2, pulse 76, respiratory  rate 16, blood pressure 176/90.  Saturating 100% on room air.  GENERAL:  No acute distress.  HEENT:  Normocephalic, atraumatic.  PERRLA.  EOMI.  ENT is within normal  limits.  NECK:  Supple.  No lymphadenopathy, JVD or bruit.  HEART:  Regular rate and rhythm.  No murmur, rub or gallop are  appreciated.  LUNGS:  Clear to auscultation blast bilaterally.  ABDOMEN:  Soft, nontender, normoactive bowel sounds.  No  hepatosplenomegaly appreciated.  EXTREMITIES:  No clubbing, cyanosis or edema.  NEURO:  The patient is oriented to person, place, and time; and has  normal movement in all of her extremities.  MUSCULOSKELETAL:  No joint deformities or indications of trauma.   PRIOR TEST:  Patient's lipids done on Jul 20, 2006 showed a cholesterol  of 169, triglyceride 52, HDL 56 and an LDL slightly above her goal of  100 at 103.  Her last hemoglobin A1c was 6.0.  EKG shows nonacute normal  sinus rhythm with no evidence of strain pattern.   LABS:  White count 6.5.  Hemoglobin 11.3, hematocrit 33.9, platelets  240, BUN and creatinine are 14 and 0.8 respectively.  Glucose was 86  this morning.  Electrolytes otherwise within normal limits.  First set  of cardiac enzymes shows a myoglobin of 80 with a CK/MB of 2.6 and  troponin less than 0.05.   ASSESSMENT AND PLAN:  1. Chest pain.  Given her cardiovascular risk factors including early      family vascular disease as well as hypertension, hyperlipidemia,      and diabetes all which are quite well controlled; we will rule her      out for myocardial infarction.  In fact, if she rules out, she may      need to Dr. Patty Sermons, again, for further cardiac testing.  She      indicates  that she had a negative stress test approximately 3 years      ago.  Other etiology could be reflux related.  Will see if a GI      cocktail provides her some relief.  This can also be      musculoskeletal, and might be related to helping her husband who is      essentially paralyzed on one side, and can not do everything for      himself.  She has pain medicines for these, but choose not to use      them at home so I cannot tell whether they would have helped or      not.  2. Gastroesophageal reflux disease.  Continue on Protonix per #1.  May      consider an alternative if the Protonix does not be working.  3. Hypertension.  Continue her on her lisinopril, but will add      metoprolol 25 mg b.i.d.  As we are working on cardiovascular      disease, we will further increase the lisinopril if her blood      pressure remains elevated, otherwise, suspect some of this is      secondary to pain.  She has been seen within the past month for      what she thought were higher blood pressures and found to be in the      140s, and was supposed to be keeping a diary which she does not      have with her currently.  We will see whether we need to change any      of her medications.  4. Hyperlipidemia.  She is essentially at goal on Zocor.  I will not      change this medicine, she is tolerating it well.  5. Degenerative joint disease.  C-and-L-spine the patient has pain      medications and needs to use them.  I have urged her not to use BC      powders or medicines contained large amounts of nonsteroidals      because of the history of cardiovascular disease as well as      hypertension .  6. Diabetes mellitus type 2:  Continue metformin 500mg  daily, SSI if      CBG>150.      Gaspar Garbe, M.D.  Electronically Signed     RWT/MEDQ  D:  12/20/2006  T:  12/20/2006  Job:  [id number]   cc:   Cassell Clement, M.D.  Fax: [phone]

## 2010-07-31 NOTE — Op Note (Signed)
NAME:  Kylie Wilson, Kylie Wilson               ACCOUNT NO.:  [id number]   MEDICAL RECORD NO.:  [id number]          PATIENT TYPE:  AMB   LOCATION:  ENDO                         FACILITY:  [facility]   PHYSICIAN:  James L. Malon Kindle., M.D.DATE OF BIRTH:  [birth year]   DATE OF PROCEDURE:  01/15/2005  DATE OF DISCHARGE:                                 OPERATIVE REPORT   PROCEDURE:  Esophagogastroduodenoscopy.   MEDICATIONS:  Fentanyl 50 mcg, Versed 6 mg IV.   INDICATIONS FOR PROCEDURE:  Vague upper GI symptoms with apparently a  history of some sort of abnormality on endoscopy. This was done in [place].   DESCRIPTION OF PROCEDURE:  The procedure explained to the patient and  consent obtained. With the patient in the left lateral decubitus position,  the Olympus scope inserted and advanced, stomach entered, pylorus identified  and passed. The duodenum including the bulb and second portion were seen  well and was unremarkable. The gastric antrum was somewhat reddened and  inflamed. A biopsy was taken for rapid urease test for Helicobacter and  there were no ulcerations. The body and fundus of the stomach were normal.  There was a hiatal hernia extending from 40 to 35 cm. At approximately 5 cm,  there was a widely patent GE junction. The distal and proximal esophagus was  endoscopically normal. The scope was withdrawn, patient tolerated the  procedure well.   ASSESSMENT:  Gastritis, 535.00.   PLAN:  Will check results of CLOtest, continue the patient on protonix.  Proceed to colonoscopy at this time as planned.           ______________________________  Llana Aliment. Malon Kindle., M.D.     Waldron Session  D:  01/15/2005  T:  01/15/2005  Job:  [id number]

## 2010-07-31 NOTE — Op Note (Signed)
NAME:  Kylie Wilson, Kylie Wilson               ACCOUNT NO.:  [id number]   MEDICAL RECORD NO.:  [id number]          PATIENT TYPE:  AMB   LOCATION:  ENDO                         FACILITY:  [facility]   PHYSICIAN:  James L. Malon Kindle., M.D.DATE OF BIRTH:  05/16/1950   DATE OF PROCEDURE:  01/15/2005  DATE OF DISCHARGE:                                 OPERATIVE REPORT   PROCEDURE:  Colonoscopy.   MEDICATIONS:  1.  Fentanyl 100 mcg.  2.  Versed 9 mg IV.   SCOPE:  Pediatric adjustable colonoscope.   INDICATION:  Colon cancer screening.   DESCRIPTION OF PROCEDURE:  The procedure explained to the patient and  consent obtained.  The patient in left lateral decubitus position, the  Olympus scope was passed and advanced easily to the cecum.  The ileocecal  valve and appendiceal orifice seen.  The scope withdrawn.  Cecum, ascending,  transverse, descending, and sigmoid colon seen well.  No polyps seen.  No  diverticulosis.  Rectum free of polyps.  Scope withdrawn.  The patient  tolerated the procedure well.   ASSESSMENT:  Normal screening colonoscopy.  V76.51.   PLAN:  Routine postpolypectomy instructions.  Recommend yearly Hemoccults.  See back in the office in 3-4 months.           ______________________________  Llana Aliment. Malon Kindle., M.D.     Waldron Session  D:  01/15/2005  T:  01/15/2005  Job:  [id number]

## 2010-07-31 NOTE — H&P (Signed)
NAME:  Kylie Wilson, Kylie Wilson NO.:  [id number]   MEDICAL RECORD NO.:  [id number]          PATIENT TYPE:  AMB   LOCATION:  SDS                          FACILITY:  [facility]   PHYSICIAN:  Hermelinda Medicus, M.D.   DATE OF BIRTH:  1950/03/23   DATE OF ADMISSION:  05/12/2005  DATE OF DISCHARGE:                                HISTORY & PHYSICAL   This patient is a 60 year old female who has had persistent sleep apnea  difficulties.  She weighs 256 and has a very small mouth.  She also has a  history of nasal trauma with some nasal obstruction.  She has been tried out  with CPAP which has essentially failed.  They apparently had their settings  at 11 but even 16 did not seem to push enough air through her nose because  of the septal deviation.  She also said it made her very claustrophobic and  could not tolerate it and has abandoned CPAP some time ago.  Her initial  work-up showed an O2 oxygen desaturation nadir of 85%.  She had a 65.2 apnea  index RDI and she is also chronically fatigued with considerable waking  herself up with this problem.  With this she has had a complete  cardiovascular work-up by Dr. Ronny Flurry and has been cleared of  cardiovascular problems.  Her history was one of having a questionable  myocardial infarction in 1999 which she denies.  Dr. Patty Sermons did an  adenosine Cardiolite stress test and found this to be a very satisfactory  result.  The results of that showed a normal no motion artifact, normal  heart/lung ratio, no evidence of ischemia.  Her ejection fraction was 70%,  end-systolic volume was 22 mL, end-diastolic volume was 72 mL so her final  status was no chest pain, no ST or T-wave abnormalities suggesting ischemia,  normal adenosine Cardiolite, no evidence of ischemia, normal left  ventricular function.  With this clearance we felt she was at higher risk  because of her obesity and she also has diabetes mellitus type 2 and this  use of tobacco  plus the hypertension.  She is on Metformin 500 mg two a day,  lisinopril 10 mg two a day, and Protonix 40 mg for reflux one a day.  She is  also on hydrochlorothiazide 25 mg and has been off aspirin 81 and also takes  a BC powder occasionally and has had some more recently, though she had been  asked to stop.  The remainder of her review of systems is unremarkable.  She  has had previous surgery, bone removed from her toe.  She had a tubal  ligation, a left breast biopsy benign, a scar revision, dog bite leg, and  this partial upper dentures.  Her physical examination reveals a blood  pressure of 134/84.  She is 5 feet 4 inches, weighs 254.  Her heart rate is  68.  She has no allergies to medications.  Does smoke about a half a pack a  day and does not drink alcohol.  Ears are clear.  Tympanic membranes are  clear.  The oral cavity shows a very high arched tongue with a full neck  with a small mouth with large tonsils and a very large, long uvula.  Her  neck is full.  No cervical adenopathy or mass.  Her nose also shows a severe  septal deviation with nasal obstruction and turbinate hypertrophy.  She has  a concha bullosa on the right.  Her chest is clear.  No rales, rhonchi, or  wheezes.  Cardiovascular:  No opening snaps, murmurs or gallops.  Abdomen is  moderately obese.  Extremities unremarkable.   INITIAL DIAGNOSES:  1.  Sleep apnea.  2.  Septal deviation.  3.  Nasal obstruction.  4.  Diabetes mellitus type 2.  5.  Hypertension.  6.  History of questionable myocardial infarction, cleared by recent cardiac      work-up.  7.  Tobacco use.           ______________________________  Hermelinda Medicus, M.D.     JC/MEDQ  D:  05/12/2005  T:  05/12/2005  Job:  [id number]   cc:   Cassell Clement, M.D.  Fax: [phone]   Clinton D. Maple Hudson, M.D.  [facility] Dept  [address]  [place]  [place] [address]

## 2010-07-31 NOTE — Op Note (Signed)
NAME:  BONNELL, PLACZEK NO.:  [id number]   MEDICAL RECORD NO.:  [id number]          PATIENT TYPE:  OIB   LOCATION:  2550                         FACILITY:  [facility]   PHYSICIAN:  Hermelinda Medicus, M.D.   DATE OF BIRTH:  Nov 01, 1950   DATE OF PROCEDURE:  05/12/2005  DATE OF DISCHARGE:                                 OPERATIVE REPORT   PREOP DIAGNOSIS:  Sleep apnea, failed CPAP, septal deviation, turbinate  hypertrophy, obesity, diabetes, tobacco use.   POSTOP DIAGNOSIS:  Sleep apnea, failed CPAP, septal deviation, turbinate  hypertrophy, obesity, diabetes, tobacco use.   OPERATION:  Septal reconstruction, turbinate reduction, the tonsillectomy  and a palatopharyngoplasty.   SURGEON:  Dr. Hermelinda Medicus.   ANESTHESIA:  General endotracheal with Dr. Noreene Larsson  DIFFICULT INTUBATION WITH THE FULL TONGUE AND FULL NECK.  Glide View  laryngoscope needed.   PROCEDURE:  Patient placed in supine position and under general orotracheal  anesthesia, first we approached the nose where of the septum was obviously  deviated in the ethmoid and the vomerine region. We anesthetized the nose  with 1% Xylocaine with epinephrine, 5 mL and topical cocaine 200 milligrams.  We also at this point outfractured the inferior turbinates in an effort to  gain some space within the nose. We then made a hemitransfixion incision on  the right side, carried it around the columella to the left and back along  the ethmoid septal deviation where strip of quadrilateral cartilage and  ethmoid deviation was removed.  We then worked inferiorly accomplishing  removal of a portion of vomerine septal deviation which was also blocking  the nasal airway. Once this was completed, the septum was established in the  midline and the closure was begun using 5-0 plain catgut and through-and-  through septal suture using 4-0 plain. The turbinates were further  outfractured and we used the butter knife on the inferior  turbinates and the  polyp forceps to reduce a concha bullosa on the right.  No mucous membrane  was removed. We however used the Elmed set at 15 to cauterize the  inferolateral portions of the mucous membranes on the inferior turbinates.  Once this was completed, Telfa was then placed in the nose to minimize the  swelling. We gained a huge amount of space by reducing the turbinates and  the septum and correcting septal deviation. We then carried on using the  tonsillar gag, the Davis mouth gag and the tonsils were removed using blunt  and Bovie electrocoagulation dissection. Hemostasis was established with the  Bovie electrocoagulation. The uvula which was approximately three times  larger than normal size was trimmed and then closure of the anterior-  posterior mucous membrane was accomplished with 5-0 plain catgut.  All  hemostasis was established of the tonsillar beds.  The nasopharynx and  stomach was suctioned and the gag was slowly released showing no evidence of  any bleeding. Total blood loss was estimated at 75 mL.  The patient will be  kept overnight in 3300 for observation.  Anesthesia trumpets were placed  within the nose to minimize swelling  and to maximize nasal airway breathing  in the early postoperative course.  The patient tolerated procedure very  well. Follow up will be tonight and then tomorrow, then in 1 week, 2 weeks,  3 weeks, 6 weeks, 3 months, then 6 months then a year.           ______________________________  Hermelinda Medicus, M.D.     JC/MEDQ  D:  05/12/2005  T:  05/12/2005  Job:  [id number]   cc:   Cassell Clement, M.D.  Fax: [phone]   Clinton D. Maple Hudson, M.D.  [facility] Dept  [address]  [place]  [place] [address]

## 2010-07-31 NOTE — Procedures (Signed)
NAME:  Kylie Wilson, Kylie Wilson NO.:  [id number]   MEDICAL RECORD NO.:  [id number]          PATIENT TYPE:  OUT   LOCATION:  SLEEP CENTER                 FACILITY:  [facility]   PHYSICIAN:  Clinton D. Maple Hudson, M.D. DATE OF BIRTH:  [birth year]   DATE OF STUDY:                              NOCTURNAL POLYSOMNOGRAM   REFERRING PHYSICIAN:  Dr. Jetty Duhamel.   DATE OF STUDY:  March 10, 2005.   INDICATION FOR STUDY:  Hypersomnia with sleep apnea.   EPWORTH SLEEPINESS SCORE:  9/24.   BMI:  39.   WEIGHT:  235 pounds.   SLEEP ARCHITECTURE:  Total sleep time 298 minutes with sleep efficiency 76%.  Stage I was 4%, stage II 76%, stages III and IV 10%, REM 9% of total sleep  time. Sleep latency 14 minutes, REM latency 204 minutes, awake after sleep  onset 78 minutes, arousal index 17. No sleep medication was reported but she  brought a large dinner with her to eat at 8:30 p.m. saying this would make  her sleepy.   RESPIRATORY DATA:  Split study protocol:  Apnea/hypopnea index (AHI, RDI)  65.2 obstructive events per hour indicating severe obstructive sleep  apnea/hypopnea syndrome. This included 42 obstructive apneas and 89  hypopneas before C-PAP. Events were nonpositional. REM AHI of 2.2 per hour.  C-PAP was titrated to 16 CWP. She appeared to have adequate event control  and 11 CWP, AHI 0 per hour with some residual snoring. She awoke asking to  remove the mask complaining of smothering around 6:30 a.m. At that point,  the technician had been raising the pressure up to 16 CWP trying to stop  snoring. A medium Respironics full face mask was used with heated  humidifier.   OXYGEN DATA:  Loud snoring with oxygen desaturation to a nadir of 85% on  room air. With C-PAP control, saturation held at 95-98% on room air.   CARDIAC DATA:  Sinus rhythm with occasional PVC.   MOVEMENT/PARASOMNIA:  Occasional leg jerk, insignificant.   IMPRESSION/RECOMMENDATIONS:  1.  Severe obstructive  sleep apnea/hypopnea syndrome, AHI 65.2 per hour with      loud snoring, nonpositional events and oxygen desaturation to 85%.  2.  C-PAP titration to 11 CWP, AHI 0 per hour. There was residual snoring up      to a pressure of 16 CWP attempted by the technician but not tolerated by      the patient. A medium Respironics comfort full face mask was used with      heated humidifier.  3.  The patient was uncomfortable having anything on her face and will need      some help if she is to adjust successfully to this.      Clinton D. Maple Hudson, M.D.  Diplomate, Biomedical engineer of Sleep Medicine  Electronically Signed     CDY/MEDQ  D:  03/14/2005 10:19:32  T:  03/14/2005 16:38:16  Job:  [id number]

## 2010-12-24 LAB — CARDIAC PANEL(CRET KIN+CKTOT+MB+TROPI)
CK, MB: 2.7
CK, MB: 3
Relative Index: 2
Relative Index: 2.1
Total CK: 134
Total CK: 144
Troponin I: 0.01
Troponin I: 0.02

## 2010-12-24 LAB — DIFFERENTIAL
Basophils Absolute: 0
Basophils Absolute: 0.1
Basophils Relative: 0
Basophils Relative: 1
Eosinophils Absolute: 0.1
Eosinophils Absolute: 0.2
Eosinophils Relative: 2
Eosinophils Relative: 3
Lymphocytes Relative: 18
Lymphocytes Relative: 29
Lymphs Abs: 1.1
Lymphs Abs: 1.6
Monocytes Absolute: 0.3
Monocytes Absolute: 0.4
Monocytes Relative: 5
Monocytes Relative: 7
Neutro Abs: 3.2
Neutro Abs: 4.9
Neutrophils Relative %: 60
Neutrophils Relative %: 75

## 2010-12-24 LAB — BASIC METABOLIC PANEL
BUN: 11
BUN: 14
CO2: 27
CO2: 29
Calcium: 8.7
Calcium: 8.9
Chloride: 103
Chloride: 107
Creatinine, Ser: 0.69
Creatinine, Ser: 0.89
GFR calc Af Amer: >60
GFR calc Af Amer: >60
GFR calc non Af Amer: >60
GFR calc non Af Amer: >60
Glucose, Bld: 86
Glucose, Bld: 97
Potassium: 3.6
Potassium: 3.7
Sodium: 137
Sodium: 140

## 2010-12-24 LAB — CBC
HCT: 33.7 — ABNORMAL LOW
HCT: 33.9 — ABNORMAL LOW
Hemoglobin: 11.2 — ABNORMAL LOW
Hemoglobin: 11.3 — ABNORMAL LOW
MCHC: 33.3
MCHC: 33.3
MCV: 80.4
MCV: 80.4
Platelets: 184
Platelets: 200
RBC: 4.19
RBC: 4.22
RDW: 15.8 — ABNORMAL HIGH
RDW: 16.1 — ABNORMAL HIGH
WBC: 5.4
WBC: 6.5

## 2010-12-24 LAB — CK TOTAL AND CKMB (NOT AT ARMC)
CK, MB: 2.8
Relative Index: 1.6
Total CK: 176

## 2010-12-24 LAB — POCT CARDIAC MARKERS
CKMB, poc: 2.6
Myoglobin, poc: 80.5
Operator id: 4708
Troponin i, poc: <0.05

## 2010-12-24 LAB — TROPONIN I: Troponin I: 0.03

## 2011-01-01 ENCOUNTER — Encounter (INDEPENDENT_AMBULATORY_CARE_PROVIDER_SITE_OTHER): Payer: Medicare Other | Admitting: Ophthalmology

## 2011-01-01 DIAGNOSIS — H43819 Vitreous degeneration, unspecified eye: Secondary | ICD-10-CM

## 2011-01-01 DIAGNOSIS — H353290 Exudative age-related macular degeneration, unspecified eye, stage unspecified: Secondary | ICD-10-CM

## 2011-01-01 DIAGNOSIS — E11319 Type 2 diabetes mellitus with unspecified diabetic retinopathy without macular edema: Secondary | ICD-10-CM

## 2011-01-01 DIAGNOSIS — H3530 Unspecified macular degeneration: Secondary | ICD-10-CM

## 2011-02-08 ENCOUNTER — Encounter (INDEPENDENT_AMBULATORY_CARE_PROVIDER_SITE_OTHER): Payer: Medicare Other | Admitting: Ophthalmology

## 2011-02-08 DIAGNOSIS — H3530 Unspecified macular degeneration: Secondary | ICD-10-CM

## 2011-02-08 DIAGNOSIS — H353290 Exudative age-related macular degeneration, unspecified eye, stage unspecified: Secondary | ICD-10-CM

## 2011-02-08 DIAGNOSIS — E1139 Type 2 diabetes mellitus with other diabetic ophthalmic complication: Secondary | ICD-10-CM

## 2011-02-08 DIAGNOSIS — E1165 Type 2 diabetes mellitus with hyperglycemia: Secondary | ICD-10-CM

## 2011-02-08 DIAGNOSIS — E11319 Type 2 diabetes mellitus with unspecified diabetic retinopathy without macular edema: Secondary | ICD-10-CM

## 2011-02-17 ENCOUNTER — Other Ambulatory Visit: Payer: Self-pay | Admitting: Family Medicine

## 2011-02-17 DIAGNOSIS — Z1231 Encounter for screening mammogram for malignant neoplasm of breast: Secondary | ICD-10-CM

## 2011-03-15 ENCOUNTER — Encounter (INDEPENDENT_AMBULATORY_CARE_PROVIDER_SITE_OTHER): Payer: Medicare Other | Admitting: Ophthalmology

## 2011-03-15 DIAGNOSIS — H2510 Age-related nuclear cataract, unspecified eye: Secondary | ICD-10-CM

## 2011-03-15 DIAGNOSIS — H3530 Unspecified macular degeneration: Secondary | ICD-10-CM

## 2011-03-15 DIAGNOSIS — H353290 Exudative age-related macular degeneration, unspecified eye, stage unspecified: Secondary | ICD-10-CM

## 2011-03-15 DIAGNOSIS — E1139 Type 2 diabetes mellitus with other diabetic ophthalmic complication: Secondary | ICD-10-CM

## 2011-03-15 DIAGNOSIS — E11319 Type 2 diabetes mellitus with unspecified diabetic retinopathy without macular edema: Secondary | ICD-10-CM

## 2011-03-15 DIAGNOSIS — H43819 Vitreous degeneration, unspecified eye: Secondary | ICD-10-CM

## 2011-03-15 DIAGNOSIS — E1165 Type 2 diabetes mellitus with hyperglycemia: Secondary | ICD-10-CM

## 2011-03-17 ENCOUNTER — Ambulatory Visit: Payer: Medicare Other

## 2011-03-31 ENCOUNTER — Ambulatory Visit
Admission: RE | Admit: 2011-03-31 | Discharge: 2011-03-31 | Disposition: A | Discharge status: Home / Self Care | Payer: Medicare Other | Source: Ambulatory Visit | Attending: Family Medicine | Admitting: Family Medicine

## 2011-03-31 DIAGNOSIS — Z1231 Encounter for screening mammogram for malignant neoplasm of breast: Secondary | ICD-10-CM

## 2011-04-19 ENCOUNTER — Encounter (INDEPENDENT_AMBULATORY_CARE_PROVIDER_SITE_OTHER): Payer: Medicare Other | Admitting: Ophthalmology

## 2011-04-19 DIAGNOSIS — H353290 Exudative age-related macular degeneration, unspecified eye, stage unspecified: Secondary | ICD-10-CM

## 2011-04-19 DIAGNOSIS — E11319 Type 2 diabetes mellitus with unspecified diabetic retinopathy without macular edema: Secondary | ICD-10-CM

## 2011-04-19 DIAGNOSIS — H35039 Hypertensive retinopathy, unspecified eye: Secondary | ICD-10-CM

## 2011-04-19 DIAGNOSIS — E1139 Type 2 diabetes mellitus with other diabetic ophthalmic complication: Secondary | ICD-10-CM

## 2011-04-19 DIAGNOSIS — H43819 Vitreous degeneration, unspecified eye: Secondary | ICD-10-CM

## 2011-04-19 DIAGNOSIS — H2510 Age-related nuclear cataract, unspecified eye: Secondary | ICD-10-CM

## 2011-04-19 DIAGNOSIS — I10 Essential (primary) hypertension: Secondary | ICD-10-CM

## 2011-04-19 DIAGNOSIS — H3530 Unspecified macular degeneration: Secondary | ICD-10-CM

## 2011-04-19 DIAGNOSIS — E1165 Type 2 diabetes mellitus with hyperglycemia: Secondary | ICD-10-CM

## 2011-05-24 ENCOUNTER — Encounter (INDEPENDENT_AMBULATORY_CARE_PROVIDER_SITE_OTHER): Payer: Medicare Other | Admitting: Ophthalmology

## 2011-06-03 ENCOUNTER — Encounter (INDEPENDENT_AMBULATORY_CARE_PROVIDER_SITE_OTHER): Payer: Medicare Other | Admitting: Ophthalmology

## 2011-06-03 DIAGNOSIS — E11319 Type 2 diabetes mellitus with unspecified diabetic retinopathy without macular edema: Secondary | ICD-10-CM

## 2011-06-03 DIAGNOSIS — H43819 Vitreous degeneration, unspecified eye: Secondary | ICD-10-CM

## 2011-06-03 DIAGNOSIS — H3530 Unspecified macular degeneration: Secondary | ICD-10-CM

## 2011-06-03 DIAGNOSIS — I10 Essential (primary) hypertension: Secondary | ICD-10-CM

## 2011-06-03 DIAGNOSIS — H353290 Exudative age-related macular degeneration, unspecified eye, stage unspecified: Secondary | ICD-10-CM

## 2011-06-03 DIAGNOSIS — H35039 Hypertensive retinopathy, unspecified eye: Secondary | ICD-10-CM

## 2011-06-03 DIAGNOSIS — E1139 Type 2 diabetes mellitus with other diabetic ophthalmic complication: Secondary | ICD-10-CM

## 2011-06-03 DIAGNOSIS — H2510 Age-related nuclear cataract, unspecified eye: Secondary | ICD-10-CM

## 2011-06-28 ENCOUNTER — Encounter: Payer: Self-pay | Admitting: Gastroenterology

## 2011-07-07 ENCOUNTER — Encounter (INDEPENDENT_AMBULATORY_CARE_PROVIDER_SITE_OTHER): Payer: Medicare Other | Admitting: Ophthalmology

## 2011-07-07 DIAGNOSIS — H3530 Unspecified macular degeneration: Secondary | ICD-10-CM

## 2011-07-07 DIAGNOSIS — H43819 Vitreous degeneration, unspecified eye: Secondary | ICD-10-CM

## 2011-07-07 DIAGNOSIS — E11319 Type 2 diabetes mellitus with unspecified diabetic retinopathy without macular edema: Secondary | ICD-10-CM

## 2011-07-07 DIAGNOSIS — H35039 Hypertensive retinopathy, unspecified eye: Secondary | ICD-10-CM

## 2011-07-07 DIAGNOSIS — H2510 Age-related nuclear cataract, unspecified eye: Secondary | ICD-10-CM

## 2011-07-07 DIAGNOSIS — H353290 Exudative age-related macular degeneration, unspecified eye, stage unspecified: Secondary | ICD-10-CM

## 2011-08-11 ENCOUNTER — Encounter (INDEPENDENT_AMBULATORY_CARE_PROVIDER_SITE_OTHER): Payer: Medicare Other | Admitting: Ophthalmology

## 2011-08-12 ENCOUNTER — Ambulatory Visit (AMBULATORY_SURGERY_CENTER): Payer: Medicare Other | Admitting: *Deleted

## 2011-08-12 ENCOUNTER — Encounter: Payer: Self-pay | Admitting: Gastroenterology

## 2011-08-12 VITALS — Ht 64.0 in | Wt 261.4 lb

## 2011-08-12 DIAGNOSIS — K2270 Barrett's esophagus without dysplasia: Secondary | ICD-10-CM

## 2011-08-26 ENCOUNTER — Encounter: Payer: Self-pay | Admitting: Gastroenterology

## 2011-08-26 ENCOUNTER — Other Ambulatory Visit: Payer: Self-pay

## 2011-08-26 ENCOUNTER — Other Ambulatory Visit: Payer: Self-pay | Admitting: Gastroenterology

## 2011-08-26 ENCOUNTER — Ambulatory Visit (AMBULATORY_SURGERY_CENTER): Payer: Medicare Other | Admitting: Gastroenterology

## 2011-08-26 VITALS — BP 130/49 | HR 70 | Temp 98.7°F | Resp 18 | Ht 64.0 in | Wt 261.0 lb

## 2011-08-26 DIAGNOSIS — D649 Anemia, unspecified: Secondary | ICD-10-CM

## 2011-08-26 DIAGNOSIS — K2270 Barrett's esophagus without dysplasia: Secondary | ICD-10-CM

## 2011-08-26 DIAGNOSIS — K2971 Gastritis, unspecified, with bleeding: Secondary | ICD-10-CM

## 2011-08-26 DIAGNOSIS — K2960 Other gastritis without bleeding: Secondary | ICD-10-CM

## 2011-08-26 DIAGNOSIS — K319 Disease of stomach and duodenum, unspecified: Secondary | ICD-10-CM

## 2011-08-26 DIAGNOSIS — K2100 Gastro-esophageal reflux disease with esophagitis, without bleeding: Secondary | ICD-10-CM

## 2011-08-26 LAB — GLUCOSE, CAPILLARY
Glucose-Capillary: 113 mg/dL — ABNORMAL HIGH (ref 70–99)
Glucose-Capillary: 105 mg/dL — ABNORMAL HIGH (ref 70–99)

## 2011-08-26 MED ORDER — SODIUM CHLORIDE 0.9 % IV SOLN: 500.0000 mL | INTRAVENOUS | Status: DC

## 2011-08-26 MED ORDER — PANTOPRAZOLE SODIUM 40 MG PO TBEC: 40.0000 mg | DELAYED_RELEASE_TABLET | Freq: Every day | ORAL | Status: DC

## 2011-08-26 NOTE — Progress Notes (Signed)
Patient did not experience any of the following events: a burn prior to discharge; a fall within the facility; wrong site/side/patient/procedure/implant event; or a hospital transfer or hospital admission upon discharge from the facility. (G8907) Patient did not have preoperative order for IV antibiotic SSI prophylaxis. (G8918)  

## 2011-08-26 NOTE — Op Note (Signed)
[facility] [address] [place], [place]  [address]  ENDOSCOPY PROCEDURE REPORT  PATIENT:  Kylie Wilson, Kylie Wilson  MR#:  [id number] BIRTHDATE:  04-09-1950, 61 yrs. old  GENDER:  female  ENDOSCOPIST:  Barbette Hair. Arlyce Dice, MD Referred by:  Renaye Rakers, M.D.  PROCEDURE DATE:  08/26/2011 PROCEDURE:  EGD with biopsy, 43239 ASA CLASS:  Class II INDICATIONS:  h/o Barrett's Esophagus  MEDICATIONS:   MAC sedation, administered by CRNA propofol 150mg IV, glycopyrrolate (Robinal) 0.2 mg IV, 0.6cc simethancone 0.6 cc PO TOPICAL ANESTHETIC:  DESCRIPTION OF PROCEDURE:   After the risks and benefits of the procedure were explained, informed consent was obtained.  The LB GIF-H180 [id number] endoscope was introduced through the mouth and advanced to the third portion of the duodenum.  The instrument was slowly withdrawn as the mucosa was fully examined. <<PROCEDUREIMAGES>>  hemorrhagic mucosa in the body and the antrum of the stomach. 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 Biopsies taken (see image5).  Multiple erosions were found at the gastroesophageal junction (see image2). Grade B erosive esophagitis  Barrett's esophagus was found. GE junction at 35cm. Multiple biopsies were taken of Barrett's appearing mucosa (see image9).  Otherwise the examination was normal (see image4 and image7).    Retroflexed views revealed no abnormalities. The scope was then withdrawn from the patient and the procedure completed.  COMPLICATIONS:  None  ENDOSCOPIC IMPRESSION: 1) Hemorrhagic mucosa in the body and the antrum of the stomach  2) Erosions, multiple at the gastroesophageal junction 3) Barrett's esophagus 4) Otherwise normal examination RECOMMENDATIONS: 1) Await biopsy results 2) Protonix 40 mg qd 3) Call office next 2-3 days to schedule an office appointment for 3-4 weeks 4) CBC  ______________________________ Barbette Hair. Arlyce Dice,  MD  CC:  n. eSIGNED:   Barbette Hair. Kem Parcher at 08/26/2011 04:50 PM  Charisse Klinefelter, [phone]

## 2011-08-26 NOTE — Patient Instructions (Addendum)

## 2011-08-27 ENCOUNTER — Telehealth: Payer: Self-pay | Admitting: *Deleted

## 2011-08-27 ENCOUNTER — Other Ambulatory Visit (INDEPENDENT_AMBULATORY_CARE_PROVIDER_SITE_OTHER): Payer: Medicare Other

## 2011-08-27 DIAGNOSIS — K2960 Other gastritis without bleeding: Secondary | ICD-10-CM

## 2011-08-27 LAB — CBC WITH DIFFERENTIAL/PLATELET
Basophils Absolute: 0 10*3/uL (ref 0.0–0.1)
Basophils Relative: 0.1 % (ref 0.0–3.0)
Eosinophils Absolute: 0.1 10*3/uL (ref 0.0–0.7)
Eosinophils Relative: 1.1 % (ref 0.0–5.0)
HCT: 40.3 % (ref 36.0–46.0)
Hemoglobin: 13.3 g/dL (ref 12.0–15.0)
Lymphocytes Relative: 31.3 % (ref 12.0–46.0)
Lymphs Abs: 2.1 10*3/uL (ref 0.7–4.0)
MCHC: 33 g/dL (ref 30.0–36.0)
MCV: 86.7 fl (ref 78.0–100.0)
Monocytes Absolute: 0.3 10*3/uL (ref 0.1–1.0)
Monocytes Relative: 5.2 % (ref 3.0–12.0)
Neutro Abs: 4.2 10*3/uL (ref 1.4–7.7)
Neutrophils Relative %: 62.3 % (ref 43.0–77.0)
Platelets: 196 10*3/uL (ref 150.0–400.0)
RBC: 4.65 Mil/uL (ref 3.87–5.11)
RDW: 14.6 % (ref 11.5–14.6)
WBC: 6.7 10*3/uL (ref 4.5–10.5)

## 2011-08-27 NOTE — Telephone Encounter (Signed)
  Follow up Call-  Call back number 08/26/2011  Post procedure Call Back phone  # [phone]  Permission to leave phone message Yes     Patient questions:  Do you have a fever, pain , or abdominal swelling? yes Pain Score  5   Have you tolerated food without any problems? yes  Have you been able to return to your normal activities? yes  Do you have any questions about your discharge instructions: Diet   no Medications  no Follow up visit  no  Do you have questions or concerns about your Care? yes  Actions: * If pain score is 4 or above: Pt felt her pain is from constipation. Stated she was going to take a laxative and would call us if the pain did not go away or worsened.

## 2011-08-27 NOTE — Progress Notes (Signed)
Quick Note:  Please inform the patient that lab work was normal and to continue current plan of action ______ 

## 2011-08-27 NOTE — Telephone Encounter (Signed)
If no pain instruct pt to call back if still having problems including higher fever

## 2011-09-01 ENCOUNTER — Encounter: Payer: Self-pay | Admitting: Gastroenterology

## 2011-09-09 DIAGNOSIS — E113299 Type 2 diabetes mellitus with mild nonproliferative diabetic retinopathy without macular edema, unspecified eye: Secondary | ICD-10-CM | POA: Insufficient documentation

## 2011-09-09 DIAGNOSIS — H353290 Exudative age-related macular degeneration, unspecified eye, stage unspecified: Secondary | ICD-10-CM | POA: Insufficient documentation

## 2011-09-22 ENCOUNTER — Encounter: Payer: Self-pay | Admitting: Gastroenterology

## 2011-09-22 ENCOUNTER — Ambulatory Visit (INDEPENDENT_AMBULATORY_CARE_PROVIDER_SITE_OTHER): Payer: Medicare Other | Admitting: Gastroenterology

## 2011-09-22 VITALS — BP 134/80 | HR 64 | Ht 64.0 in | Wt 256.6 lb

## 2011-09-22 DIAGNOSIS — K5900 Constipation, unspecified: Secondary | ICD-10-CM | POA: Insufficient documentation

## 2011-09-22 DIAGNOSIS — K2270 Barrett's esophagus without dysplasia: Secondary | ICD-10-CM

## 2011-09-22 MED ORDER — HYOSCYAMINE SULFATE 0.125 MG SL SUBL: 0.2500 mg | SUBLINGUAL_TABLET | SUBLINGUAL | Status: DC | PRN

## 2011-09-22 NOTE — Patient Instructions (Addendum)
We are giving you Linzess samples today

## 2011-09-22 NOTE — Assessment & Plan Note (Signed)
She has chronic idiopathic constipation which is causing mild abdominal discomfort.  Recommendations #1 trial of Linzess #2 hyomax as needed for pain

## 2011-09-22 NOTE — Progress Notes (Signed)
History of Present Illness: Pleasant 61 year old Afro-American female with Barrett's esophagus, diabetes mellitus and diverticulosis here for evaluation of abdominal pain. She suffers from moderate chronic constipation. Without a bowel movement after several days she has discomfort in the right lower abdomen. ABdominal pain is relieved with defecation. Colonoscopy in 2011 demonstrated diverticulosis. Recent endoscopy for Barrett's surveillance did not demonstrate any atypia. Gastritis was seen.    Past Medical History  Diagnosis Date  . Hypertension   . Hyperlipidemia   . Barrett's esophagus   . Arthritis   . Sleep apnea     does not tolerate cpap  . GERD (gastroesophageal reflux disease)   . Diabetes mellitus    Past Surgical History  Procedure Date  . Breast biopsy 1980    left  . Scar revision 2001    both legs  . Toe surgery 1981    4th and 5th toe right foot  . Toe surgery 2002    great toe left   family history includes Heart disease in her father.  There is no history of Colon cancer, and Stomach cancer, and Colon polyps, and Rectal cancer, . Current Outpatient Prescriptions  Medication Sig Dispense Refill  . AmLODIPine Besylate (NORVASC PO) Take by mouth daily.      . magnesium 30 MG tablet Take 30 mg by mouth daily.      . metFORMIN (GLUCOPHAGE) 500 MG tablet Take 500 mg by mouth daily with breakfast.      . naproxen (NAPROSYN) 500 MG tablet Take 500 mg by mouth 2 (two) times daily with a meal.      . pantoprazole (PROTONIX) 40 MG tablet Take 1 tablet (40 mg total) by mouth daily.  30 tablet  1  . SIMVASTATIN PO Take by mouth daily.      Marland Kitchen SPIRONOLACTONE PO Take by mouth 2 (two) times daily.       Allergies as of 09/22/2011  . (No Known Allergies)    reports that she quit smoking 7 days ago. Her smoking use included Cigarettes. She smoked .8 packs per day. She has never used smokeless tobacco. She reports that she does not drink alcohol or use illicit  drugs.     Review of Systems: Pertinent positive and negative review of systems were noted in the above HPI section. All other review of systems were otherwise negative.  Vital signs were reviewed in today's medical record Physical Exam: General: Well developed , well nourished, no acute distress Head: Normocephalic and atraumatic Eyes:  sclerae anicteric, EOMI Ears: Normal auditory acuity Mouth: No deformity or lesions Neck: Supple, no masses or thyromegaly Lungs: Clear throughout to auscultation Heart: Regular rate and rhythm; no murmurs, rubs or bruits Abdomen: Soft,  and non distended. No masses, hepatosplenomegaly or hernias noted. Normal Bowel sounds. There is minimal tenderness to palpation in the right lower quadrant Rectal:deferred Musculoskeletal: Symmetrical with no gross deformities  Skin: No lesions on visible extremities Pulses:  Normal pulses noted Extremities: No clubbing, cyanosis, edema or deformities noted Neurological: Alert oriented x 4, grossly nonfocal Cervical Nodes:  No significant cervical adenopathy Inguinal Nodes: No significant inguinal adenopathy Psychological:  Alert and cooperative. Normal mood and affect

## 2011-09-23 NOTE — Assessment & Plan Note (Signed)
F/U EGD 3 years

## 2011-09-28 ENCOUNTER — Other Ambulatory Visit: Payer: Self-pay | Admitting: Gastroenterology

## 2011-09-28 MED ORDER — LINACLOTIDE 145 MCG PO CAPS: 145.0000 ug | ORAL_CAPSULE | Freq: Every day | ORAL | Status: DC

## 2011-09-28 NOTE — Telephone Encounter (Signed)
Medication sent to pharmacy  

## 2013-10-25 ENCOUNTER — Ambulatory Visit
Admission: RE | Admit: 2013-10-25 | Discharge: 2013-10-25 | Disposition: A | Discharge status: Home / Self Care | Payer: Medicare Other | Source: Ambulatory Visit | Attending: Family Medicine | Admitting: Family Medicine

## 2013-10-25 ENCOUNTER — Other Ambulatory Visit: Payer: Self-pay | Admitting: Family Medicine

## 2013-10-25 ENCOUNTER — Encounter: Payer: Self-pay | Admitting: Gastroenterology

## 2013-10-25 DIAGNOSIS — M79641 Pain in right hand: Secondary | ICD-10-CM

## 2014-06-21 ENCOUNTER — Encounter: Payer: Self-pay | Admitting: Gastroenterology

## 2014-11-20 ENCOUNTER — Encounter (INDEPENDENT_AMBULATORY_CARE_PROVIDER_SITE_OTHER): Payer: Medicare Other | Admitting: Ophthalmology

## 2014-11-20 DIAGNOSIS — I10 Essential (primary) hypertension: Secondary | ICD-10-CM | POA: Diagnosis not present

## 2014-11-20 DIAGNOSIS — H3532 Exudative age-related macular degeneration: Secondary | ICD-10-CM | POA: Diagnosis not present

## 2014-11-20 DIAGNOSIS — E11311 Type 2 diabetes mellitus with unspecified diabetic retinopathy with macular edema: Secondary | ICD-10-CM

## 2014-11-20 DIAGNOSIS — E11339 Type 2 diabetes mellitus with moderate nonproliferative diabetic retinopathy without macular edema: Secondary | ICD-10-CM

## 2014-11-20 DIAGNOSIS — H3531 Nonexudative age-related macular degeneration: Secondary | ICD-10-CM | POA: Diagnosis not present

## 2014-11-20 DIAGNOSIS — E11331 Type 2 diabetes mellitus with moderate nonproliferative diabetic retinopathy with macular edema: Secondary | ICD-10-CM | POA: Diagnosis not present

## 2014-11-20 DIAGNOSIS — H35033 Hypertensive retinopathy, bilateral: Secondary | ICD-10-CM | POA: Diagnosis not present

## 2015-01-22 ENCOUNTER — Encounter: Payer: Self-pay | Admitting: Internal Medicine

## 2015-03-20 DIAGNOSIS — K5904 Chronic idiopathic constipation: Secondary | ICD-10-CM | POA: Insufficient documentation

## 2015-07-24 LAB — HEMOGLOBIN A1C: Hemoglobin A1C: 7.3

## 2015-07-24 LAB — HEPATIC FUNCTION PANEL
ALT: 15 U/L (ref 7–35)
AST: 17 U/L (ref 13–35)
Alkaline Phosphatase: 101 U/L (ref 25–125)

## 2015-07-24 LAB — BASIC METABOLIC PANEL
BUN: 12 mg/dL (ref 4–21)
Creatinine: 0.7 mg/dL (ref 0.5–1.1)
Glucose: 127 mg/dL
Potassium: 3.8 mmol/L (ref 3.4–5.3)
Sodium: 142 mmol/L (ref 137–147)

## 2015-07-24 LAB — TSH: TSH: 1.35 u[IU]/mL (ref 0.41–5.90)

## 2015-07-24 LAB — CBC AND DIFFERENTIAL
HCT: 36 % (ref 36–46)
Hemoglobin: 11.7 g/dL — AB (ref 12.0–16.0)
Platelets: 200 10*3/uL (ref 150–399)
WBC: 6.2 10^3/mL

## 2015-07-24 LAB — LIPID PANEL
Cholesterol: 182 mg/dL (ref 0–200)
HDL: 65 mg/dL (ref 35–70)
LDL Cholesterol: 103 mg/dL
Triglycerides: 69 mg/dL (ref 40–160)

## 2015-08-14 DIAGNOSIS — E119 Type 2 diabetes mellitus without complications: Secondary | ICD-10-CM | POA: Diagnosis not present

## 2015-08-14 DIAGNOSIS — E785 Hyperlipidemia, unspecified: Secondary | ICD-10-CM | POA: Diagnosis not present

## 2015-08-14 DIAGNOSIS — I10 Essential (primary) hypertension: Secondary | ICD-10-CM | POA: Diagnosis not present

## 2015-08-14 DIAGNOSIS — Z0000 Encounter for general adult medical examination without abnormal findings: Secondary | ICD-10-CM | POA: Diagnosis not present

## 2015-08-14 DIAGNOSIS — Z136 Encounter for screening for cardiovascular disorders: Secondary | ICD-10-CM | POA: Diagnosis not present

## 2015-08-14 DIAGNOSIS — Z87891 Personal history of nicotine dependence: Secondary | ICD-10-CM | POA: Diagnosis not present

## 2015-09-22 DIAGNOSIS — N812 Incomplete uterovaginal prolapse: Secondary | ICD-10-CM | POA: Diagnosis not present

## 2015-09-22 DIAGNOSIS — R3129 Other microscopic hematuria: Secondary | ICD-10-CM | POA: Diagnosis not present

## 2015-10-29 DIAGNOSIS — N812 Incomplete uterovaginal prolapse: Secondary | ICD-10-CM | POA: Diagnosis not present

## 2015-11-14 DIAGNOSIS — H5203 Hypermetropia, bilateral: Secondary | ICD-10-CM | POA: Diagnosis not present

## 2015-11-20 DIAGNOSIS — E119 Type 2 diabetes mellitus without complications: Secondary | ICD-10-CM | POA: Diagnosis not present

## 2015-11-20 DIAGNOSIS — Z87891 Personal history of nicotine dependence: Secondary | ICD-10-CM | POA: Diagnosis not present

## 2015-11-20 DIAGNOSIS — I10 Essential (primary) hypertension: Secondary | ICD-10-CM | POA: Diagnosis not present

## 2015-11-20 DIAGNOSIS — E785 Hyperlipidemia, unspecified: Secondary | ICD-10-CM | POA: Diagnosis not present

## 2016-01-14 ENCOUNTER — Encounter (HOSPITAL_COMMUNITY): Payer: Self-pay | Admitting: *Deleted

## 2016-01-14 ENCOUNTER — Ambulatory Visit (HOSPITAL_COMMUNITY)
Admission: EM | Admit: 2016-01-14 | Discharge: 2016-01-14 | Disposition: A | Discharge status: Home / Self Care | Payer: Medicare Other | Attending: Family Medicine | Admitting: Family Medicine

## 2016-01-14 ENCOUNTER — Ambulatory Visit (INDEPENDENT_AMBULATORY_CARE_PROVIDER_SITE_OTHER): Payer: Medicare Other

## 2016-01-14 DIAGNOSIS — J181 Lobar pneumonia, unspecified organism: Secondary | ICD-10-CM

## 2016-01-14 DIAGNOSIS — J189 Pneumonia, unspecified organism: Secondary | ICD-10-CM

## 2016-01-14 DIAGNOSIS — R05 Cough: Secondary | ICD-10-CM | POA: Diagnosis not present

## 2016-01-14 MED ORDER — ALBUTEROL SULFATE (2.5 MG/3ML) 0.083% IN NEBU
INHALATION_SOLUTION | RESPIRATORY_TRACT | Status: AC
  Filled 2016-01-14: qty 6

## 2016-01-14 MED ORDER — IPRATROPIUM BROMIDE 0.02 % IN SOLN
RESPIRATORY_TRACT | Status: AC
  Filled 2016-01-14: qty 2.5

## 2016-01-14 MED ORDER — METHYLPREDNISOLONE ACETATE 80 MG/ML IJ SUSP
80.0000 mg | Freq: Once | INTRAMUSCULAR | Status: AC
  Administered 2016-01-14: 80 mg via INTRAMUSCULAR

## 2016-01-14 MED ORDER — AZITHROMYCIN 250 MG PO TABS: ORAL_TABLET | ORAL | 0 refills | Status: DC

## 2016-01-14 MED ORDER — METHYLPREDNISOLONE ACETATE 80 MG/ML IJ SUSP
INTRAMUSCULAR | Status: AC
  Filled 2016-01-14: qty 1

## 2016-01-14 MED ORDER — ALBUTEROL SULFATE HFA 108 (90 BASE) MCG/ACT IN AERS: 1.0000 | INHALATION_SPRAY | Freq: Four times a day (QID) | RESPIRATORY_TRACT | 0 refills | Status: DC | PRN

## 2016-01-14 MED ORDER — ALBUTEROL SULFATE (2.5 MG/3ML) 0.083% IN NEBU
5.0000 mg | INHALATION_SOLUTION | Freq: Once | RESPIRATORY_TRACT | Status: AC
  Administered 2016-01-14: 5 mg via RESPIRATORY_TRACT

## 2016-01-14 MED ORDER — IPRATROPIUM BROMIDE 0.02 % IN SOLN
0.5000 mg | Freq: Once | RESPIRATORY_TRACT | Status: AC
  Administered 2016-01-14: 0.5 mg via RESPIRATORY_TRACT

## 2016-01-14 NOTE — ED Triage Notes (Signed)
Coughing   Congested     With  Symptoms  X  2  Days    Pt  denys  Any  Chest  Pain        Pt  Reports  Cough  Is  Productive

## 2016-01-14 NOTE — ED Provider Notes (Signed)
[facility]    CSN: [id number] Arrival date & time: 01/14/16  1951     History   Chief Complaint Chief Complaint  Patient presents with  . Cough    HPI Kylie Wilson is a 65 y.o. female.   The history is provided by the patient.  Cough  Cough characteristics:  Non-productive Severity:  Mild Onset quality:  Gradual Duration:  2 days Progression:  Unchanged Chronicity:  Chronic Smoker: no   Context: weather changes   Worsened by:  Nothing Ineffective treatments:  None tried Associated symptoms: rhinorrhea, shortness of breath and wheezing   Associated symptoms: no chest pain and no fever     Past Medical History:  Diagnosis Date  . Arthritis   . Barrett's esophagus   . Diabetes mellitus   . GERD (gastroesophageal reflux disease)   . Hyperlipidemia   . Hypertension   . Sleep apnea    does not tolerate cpap    Patient Active Problem List   Diagnosis Date Noted  . Unspecified constipation 09/22/2011  . HYPERLIPIDEMIA 08/27/2009  . DEPRESSION 08/27/2009  . HYPERTENSION 08/27/2009  . SLEEP APNEA 08/27/2009  . DYSPNEA 08/27/2009  . BARRETTS ESOPHAGUS 08/07/2009  . ESOPHAGEAL REFLUX 08/07/2008  . COUGH 08/07/2008  . DIVERTICULOSIS OF COLON 11/17/2007    Past Surgical History:  Procedure Laterality Date  . BREAST BIOPSY  1980   left  . SCAR REVISION  2001   both legs  . TOE SURGERY  1981   4th and 5th toe right foot  . TOE SURGERY  2002   great toe left    OB History    No data available       Home Medications    Prior to Admission medications   Medication Sig Start Date End Date Taking? Authorizing Provider  AmLODIPine Besylate (NORVASC PO) Take by mouth daily.    Historical Provider, MD  hyoscyamine (LEVSIN SL) 0.125 MG SL tablet Place 2 tablets (0.25 mg total) under the tongue every 4 (four) hours as needed for cramping. 09/22/11 10/02/11  Louis Meckelobert D Kaplan, MD  Linaclotide [facility](LINZESS) 145 MCG CAPS Take 1 capsule (145 mcg total) by  mouth daily. 09/28/11   Louis Meckelobert D Kaplan, MD  magnesium 30 MG tablet Take 30 mg by mouth daily.    Historical Provider, MD  metFORMIN (GLUCOPHAGE) 500 MG tablet Take 500 mg by mouth daily with breakfast.    Historical Provider, MD  naproxen (NAPROSYN) 500 MG tablet Take 500 mg by mouth 2 (two) times daily with a meal.    Historical Provider, MD  pantoprazole (PROTONIX) 40 MG tablet Take 1 tablet (40 mg total) by mouth daily. 08/26/11 08/25/12  Louis Meckelobert D Kaplan, MD  SIMVASTATIN PO Take by mouth daily.    Historical Provider, MD  SPIRONOLACTONE PO Take by mouth 2 (two) times daily.    Historical Provider, MD    Family History Family History  Problem Relation Age of Onset  . Heart disease Father   . Colon cancer Neg Hx   . Stomach cancer Neg Hx   . Colon polyps Neg Hx   . Rectal cancer Neg Hx     Social History Social History  Substance Use Topics  . Smoking status: Former Smoker    Packs/day: 0.80    Types: Cigarettes    Quit date: 09/15/2011  . Smokeless tobacco: Never Used  . Alcohol use No     Allergies   Review of patient's allergies indicates no known  allergies.   Review of Systems Review of Systems  Constitutional: Negative for fever.  HENT: Positive for rhinorrhea.   Respiratory: Positive for cough, shortness of breath and wheezing.   Cardiovascular: Negative for chest pain.  All other systems reviewed and are negative.    Physical Exam Triage Vital Signs ED Triage Vitals  Enc Vitals Group     BP 01/14/16 2012 157/81     Pulse Rate 01/14/16 2012 81     Resp 01/14/16 2012 16     Temp 01/14/16 2012 98.2 F (36.8 C)     Temp Source 01/14/16 2012 Oral     SpO2 01/14/16 2012 98 %     Weight --      Height --      Head Circumference --      Peak Flow --      Pain Score 01/14/16 2015 2     Pain Loc --      Pain Edu? --      Excl. in GC? --    No data found.   Updated Vital Signs BP 157/81 (BP Location: Left Arm)   Pulse 81   Temp 98.2 F (36.8 C) (Oral)    Resp 16   SpO2 98%   Visual Acuity Right Eye Distance:   Left Eye Distance:   Bilateral Distance:    Right Eye Near:   Left Eye Near:    Bilateral Near:     Physical Exam  Constitutional: She is oriented to person, place, and time. She appears well-developed and well-nourished. No distress.  HENT:  Mouth/Throat: Oropharynx is clear and moist.  Neck: Normal range of motion. Neck supple.  Cardiovascular: Normal rate, regular rhythm, normal heart sounds and intact distal pulses.   Pulmonary/Chest: Effort normal and breath sounds normal. She has no wheezes.  Lymphadenopathy:    She has no cervical adenopathy.  Neurological: She is alert and oriented to person, place, and time.  Skin: Skin is warm and dry.  Nursing note and vitals reviewed.    UC Treatments / Results  Labs (all labs ordered are listed, but only abnormal results are displayed) Labs Reviewed - No data to display  EKG  EKG Interpretation None       Radiology No results found. X-rays reviewed and report per radiologist.  Procedures Procedures (including critical care time)  Medications Ordered in UC Medications  methylPREDNISolone acetate (DEPO-MEDROL) injection 80 mg (not administered)  albuterol (PROVENTIL) (2.5 MG/3ML) 0.083% nebulizer solution 5 mg (not administered)  ipratropium (ATROVENT) nebulizer solution 0.5 mg (not administered)     Initial Impression / Assessment and Plan / UC Course  I have reviewed the triage vital signs and the nursing notes.  Pertinent labs & imaging results that were available during my care of the patient were reviewed by me and considered in my medical decision making (see chart for details).  Clinical Course      Final Clinical Impressions(s) / UC Diagnoses   Final diagnoses:  None    New Prescriptions New Prescriptions   No medications on file     Linna HoffJames D Kindl, MD 01/14/16 2100

## 2016-01-14 NOTE — Discharge Instructions (Signed)
Drink plenty of fluids as discussed, use medicine as prescribed, and mucinex or delsym for cough. see your doctor if further problems °

## 2016-01-19 ENCOUNTER — Encounter (HOSPITAL_COMMUNITY): Payer: Self-pay | Admitting: Emergency Medicine

## 2016-01-19 ENCOUNTER — Ambulatory Visit (HOSPITAL_COMMUNITY)
Admission: EM | Admit: 2016-01-19 | Discharge: 2016-01-19 | Disposition: A | Discharge status: Home / Self Care | Payer: Medicare Other | Attending: Family Medicine | Admitting: Family Medicine

## 2016-01-19 ENCOUNTER — Ambulatory Visit (INDEPENDENT_AMBULATORY_CARE_PROVIDER_SITE_OTHER): Payer: Medicare Other

## 2016-01-19 DIAGNOSIS — B9789 Other viral agents as the cause of diseases classified elsewhere: Secondary | ICD-10-CM

## 2016-01-19 DIAGNOSIS — J069 Acute upper respiratory infection, unspecified: Secondary | ICD-10-CM

## 2016-01-19 DIAGNOSIS — R05 Cough: Secondary | ICD-10-CM | POA: Diagnosis not present

## 2016-01-19 NOTE — Discharge Instructions (Signed)
Drink plenty of fluids as discussed,  and mucinex or delsym for cough. Return or see your doctor if further problems °

## 2016-01-19 NOTE — ED Triage Notes (Signed)
Pt. Stated, I was dx with walking pneumonia and I've finished my antibiotics and Im still having wheezing. My husband has cancer in his lungs and I think Im having the same symptoms.

## 2016-01-19 NOTE — ED Provider Notes (Signed)
[facility]    CSN: [id number] Arrival date & time: 01/19/16  1855     History   Chief Complaint Chief Complaint  Patient presents with  . Follow-up  . Pneumonia    HPI Kylie Wilson is a 65 y.o. female.   The history is provided by the patient.  Pneumonia  This is a recurrent problem. The current episode started more than 1 week ago ([facility].). Associated symptoms include shortness of breath.    Past Medical History:  Diagnosis Date  . Arthritis   . Barrett's esophagus   . Diabetes mellitus   . GERD (gastroesophageal reflux disease)   . Hyperlipidemia   . Hypertension   . Sleep apnea    does not tolerate cpap    Patient Active Problem List   Diagnosis Date Noted  . Unspecified constipation 09/22/2011  . HYPERLIPIDEMIA 08/27/2009  . DEPRESSION 08/27/2009  . HYPERTENSION 08/27/2009  . SLEEP APNEA 08/27/2009  . DYSPNEA 08/27/2009  . BARRETTS ESOPHAGUS 08/07/2009  . ESOPHAGEAL REFLUX 08/07/2008  . COUGH 08/07/2008  . DIVERTICULOSIS OF COLON 11/17/2007    Past Surgical History:  Procedure Laterality Date  . BREAST BIOPSY  1980   left  . SCAR REVISION  2001   both legs  . TOE SURGERY  1981   4th and 5th toe right foot  . TOE SURGERY  2002   great toe left    OB History    No data available       Home Medications    Prior to Admission medications   Medication Sig Start Date End Date Taking? Authorizing Provider  albuterol (PROVENTIL HFA;VENTOLIN HFA) 108 (90 Base) MCG/ACT inhaler Inhale 1-2 puffs into the lungs every 6 (six) hours as needed for wheezing or shortness of breath. 01/14/16   Linna HoffJames D Paulene Tayag, MD  AmLODIPine Besylate (NORVASC PO) Take by mouth daily.    Historical Provider, MD  azithromycin (ZITHROMAX Z-PAK) 250 MG tablet Take as directed on pack 01/14/16   Linna HoffJames D Quinne Pires, MD  hyoscyamine (LEVSIN SL) 0.125 MG SL tablet Place 2 tablets (0.25 mg total) under the tongue every 4 (four) hours as needed  for cramping. 09/22/11 10/02/11  Louis Meckelobert D Kaplan, MD  Linaclotide [facility](LINZESS) 145 MCG CAPS Take 1 capsule (145 mcg total) by mouth daily. 09/28/11   Louis Meckelobert D Kaplan, MD  magnesium 30 MG tablet Take 30 mg by mouth daily.    Historical Provider, MD  metFORMIN (GLUCOPHAGE) 500 MG tablet Take 500 mg by mouth daily with breakfast.    Historical Provider, MD  naproxen (NAPROSYN) 500 MG tablet Take 500 mg by mouth 2 (two) times daily with a meal.    Historical Provider, MD  pantoprazole (PROTONIX) 40 MG tablet Take 1 tablet (40 mg total) by mouth daily. 08/26/11 08/25/12  Louis Meckelobert D Kaplan, MD  SIMVASTATIN PO Take by mouth daily.    Historical Provider, MD  SPIRONOLACTONE PO Take by mouth 2 (two) times daily.    Historical Provider, MD    Family History Family History  Problem Relation Age of Onset  . Heart disease Father   . Colon cancer Neg Hx   . Stomach cancer Neg Hx   . Colon polyps Neg Hx   . Rectal cancer Neg Hx     Social History Social History  Substance Use Topics  . Smoking status: Former Smoker    Packs/day: 0.80    Types: Cigarettes    Quit date:  09/15/2011  . Smokeless tobacco: Never Used  . Alcohol use No     Allergies   Patient has no known allergies.   Review of Systems Review of Systems  Constitutional: Positive for fatigue. Negative for fever.  HENT: Negative.   Respiratory: Positive for cough, shortness of breath and wheezing.   Cardiovascular: Negative.   Gastrointestinal: Negative.   All other systems reviewed and are negative.    Physical Exam Triage Vital Signs ED Triage Vitals  Enc Vitals Group     BP 01/19/16 1937 140/60     Pulse Rate 01/19/16 1937 72     Resp 01/19/16 1937 17     Temp 01/19/16 1937 97.5 F (36.4 C)     Temp Source 01/19/16 1937 Oral     SpO2 01/19/16 1937 100 %     Weight 01/19/16 1938 269 lb (122 kg)     Height 01/19/16 1938 5\' 3"  (1.6 m)     Head Circumference --      Peak Flow --      Pain Score 01/19/16 1940 0     Pain Loc  --      Pain Edu? --      Excl. in GC? --    No data found.   Updated Vital Signs BP 140/60 (BP Location: Left Arm)   Pulse 72   Temp 97.5 F (36.4 C) (Oral)   Resp 17   Ht 5\' 3"  (1.6 m)   Wt 269 lb (122 kg)   SpO2 100%   BMI 47.65 kg/m   Visual Acuity Right Eye Distance:   Left Eye Distance:   Bilateral Distance:    Right Eye Near:   Left Eye Near:    Bilateral Near:     Physical Exam  Constitutional: She is oriented to person, place, and time. She appears well-developed and well-nourished.  HENT:  Mouth/Throat: Oropharynx is clear and moist.  Neck: Normal range of motion.  Cardiovascular: Normal rate, regular rhythm, normal heart sounds and intact distal pulses.   Pulmonary/Chest: Effort normal and breath sounds normal. She has no wheezes. She has no rales.  Abdominal: Soft. Bowel sounds are normal.  Lymphadenopathy:    She has no cervical adenopathy.  Neurological: She is alert and oriented to person, place, and time.  Skin: Skin is warm and dry.  Nursing note and vitals reviewed.    UC Treatments / Results  Labs (all labs ordered are listed, but only abnormal results are displayed) Labs Reviewed - No data to display  EKG  EKG Interpretation None       Radiology No results found. X-rays reviewed and report per radiologist.  Procedures Procedures (including critical care time)  Medications Ordered in UC Medications - No data to display   Initial Impression / Assessment and Plan / UC Course  I have reviewed the triage vital signs and the nursing notes.  Pertinent labs & imaging results that were available during my care of the patient were reviewed by me and considered in my medical decision making (see chart for details).  Clinical Course       Final Clinical Impressions(s) / UC Diagnoses   Final diagnoses:  None    New Prescriptions New Prescriptions   No medications on file     Linna HoffJames D Nilsa Macht, MD 01/20/16 2117

## 2016-02-17 ENCOUNTER — Encounter: Payer: Self-pay | Admitting: Family Medicine

## 2016-02-17 ENCOUNTER — Ambulatory Visit (INDEPENDENT_AMBULATORY_CARE_PROVIDER_SITE_OTHER): Payer: Medicare Other | Admitting: Family Medicine

## 2016-02-17 VITALS — BP 138/80 | HR 60 | Resp 12 | Ht 63.0 in | Wt 262.0 lb

## 2016-02-17 DIAGNOSIS — J449 Chronic obstructive pulmonary disease, unspecified: Secondary | ICD-10-CM | POA: Insufficient documentation

## 2016-02-17 DIAGNOSIS — E119 Type 2 diabetes mellitus without complications: Secondary | ICD-10-CM | POA: Diagnosis not present

## 2016-02-17 DIAGNOSIS — Z6841 Body Mass Index (BMI) 40.0 and over, adult: Secondary | ICD-10-CM

## 2016-02-17 DIAGNOSIS — I10 Essential (primary) hypertension: Secondary | ICD-10-CM

## 2016-02-17 DIAGNOSIS — E113293 Type 2 diabetes mellitus with mild nonproliferative diabetic retinopathy without macular edema, bilateral: Secondary | ICD-10-CM | POA: Insufficient documentation

## 2016-02-17 DIAGNOSIS — G4733 Obstructive sleep apnea (adult) (pediatric): Secondary | ICD-10-CM | POA: Diagnosis not present

## 2016-02-17 DIAGNOSIS — K219 Gastro-esophageal reflux disease without esophagitis: Secondary | ICD-10-CM | POA: Diagnosis not present

## 2016-02-17 DIAGNOSIS — R0600 Dyspnea, unspecified: Secondary | ICD-10-CM

## 2016-02-17 DIAGNOSIS — J42 Unspecified chronic bronchitis: Secondary | ICD-10-CM

## 2016-02-17 DIAGNOSIS — E6609 Other obesity due to excess calories: Secondary | ICD-10-CM | POA: Insufficient documentation

## 2016-02-17 LAB — HEMOGLOBIN A1C: Hgb A1c MFr Bld: 7.9 % — ABNORMAL HIGH (ref 4.6–6.5)

## 2016-02-17 LAB — BASIC METABOLIC PANEL
BUN: 10 mg/dL (ref 6–23)
CO2: 29 mEq/L (ref 19–32)
Calcium: 9.4 mg/dL (ref 8.4–10.5)
Chloride: 103 mEq/L (ref 96–112)
Creatinine, Ser: 0.72 mg/dL (ref 0.40–1.20)
GFR: 104.31 mL/min (ref >60.00)
Glucose, Bld: 119 mg/dL — ABNORMAL HIGH (ref 70–99)
Potassium: 4 mEq/L (ref 3.5–5.1)
Sodium: 139 mEq/L (ref 135–145)

## 2016-02-17 LAB — MICROALBUMIN / CREATININE URINE RATIO
Creatinine,U: 81.4 mg/dL
Microalb Creat Ratio: 0.9 mg/g (ref 0.0–30.0)
Microalb, Ur: <0.7 mg/dL (ref 0.0–1.9)

## 2016-02-17 MED ORDER — BUDESONIDE-FORMOTEROL FUMARATE 80-4.5 MCG/ACT IN AERO: 2.0000 | INHALATION_SPRAY | Freq: Two times a day (BID) | RESPIRATORY_TRACT | 3 refills | Status: DC

## 2016-02-17 MED ORDER — ALBUTEROL SULFATE HFA 108 (90 BASE) MCG/ACT IN AERS: 1.0000 | INHALATION_SPRAY | Freq: Four times a day (QID) | RESPIRATORY_TRACT | 1 refills | Status: DC | PRN

## 2016-02-17 MED ORDER — ESOMEPRAZOLE MAGNESIUM 40 MG PO CPDR: 40.0000 mg | DELAYED_RELEASE_CAPSULE | Freq: Every day | ORAL | 3 refills | Status: DC

## 2016-02-17 NOTE — Patient Instructions (Addendum)
A few things to remember from today's visit:   Essential hypertension - Plan: Basic metabolic panel  Obstructive sleep apnea syndrome  Gastroesophageal reflux disease, esophagitis presence not specified  Diabetes mellitus type 2, noninsulin dependent (HCC) - Plan: HgB A1c, Urine Microalbumin w/creat. ratio  Dyspnea, unspecified type - Plan: CT Chest W Contrast    Avoid foods that make your symptoms worse, for example coffee, chocolate,pepermeint,alcohol, and greasy food. Raising the head of your bed about 6 inches may help with nocturnal symptoms.   Weight loss (if you are overweight). Avoid lying down for 3 hours after eating.  Instead 3 large meals daily try small and more frequent meals during the day.   You should be evaluated immediately if bloody vomiting, bloody stools, black stools (like tar), difficulty swallowing, food gets stuck on the way down or choking when eating. Abnormal weight loss or severe abdominal pain.  Nexium 30 min before breakfast. HgA1C goal < 7.0. Avoid sugar added food:regular soft drinks, energy drinks, and sports drinks. candy. cakes. cookies. pies and cobblers. sweet rolls, pastries, and donuts. fruit drinks, such as fruitades and fruit punch. dairy desserts, such as ice cream  Mediterranean diet has showed benefits for sugar control.  How much and what type of carbohydrate foods are important for managing diabetes. The balance between how much insulin is in your body and the carbohydrate you eat makes a difference in your blood glucose levels.  Fasting blood sugar ideally 130 or less, 2 hours after meals less than 180.   Regular exercise also will help with controlling disease, daily brisk walking as tolerated for 15-30 min definitively will help.   Avoid skipping meals, blood sugar might drop and cause serious problems. Remember checking feet periodically, good dental hygiene, and annual eye exam.      Please be sure medication list  is accurate. If a new problem present, please set up appointment sooner than planned today.

## 2016-02-17 NOTE — Progress Notes (Signed)
Pre visit review using our clinic review tool, if applicable. No additional management support is needed unless otherwise documented below in the visit note. 

## 2016-02-17 NOTE — Progress Notes (Signed)
HPI:   [name] is a 65 y.o. female, who is here today to establish care with me.  Former PCP: Dr Kylie Wilson Last preventive routine visit: 07/2015.  Reporting Hx of HTN, cervical pain with radiculopathy LUE, HLD, chronic bronchitis, GERD with Barret esophagus among some.  HLD: She has tried to do better with low fat diet, stopped Zocor because muscle aching.  Cervicalgia, according to patient she is following with orthopedist as needed, recently completed oral prednisone taper, surgery was recommended but she refused to pursue it. Also Hx of generalized arthralgias and low back pain.   Concerns today: she would like a chest and abdominal CT, husband died from lung cancer and she has "same symptoms".   -She is concerned about daily wheezing for 3-4 years ago, attributed to dust coming from [facility] vent. She tells me that she "paid an environmentalist" to check vent and mold was found.  Denies Hx of COPD but reporting Hx of chronic bronchitis. Former smoker, quit about 5 years ago. She smoked for about 19 years total, quit for about 10 years in between. She smoked max 1 PPD.  She is also c/o 3-4 months of dyspnea, worse with exertion but sometimes also at rest. No associated chest pain or diaphoresis.  She is on Albuterol inhaler to use as needed, last time used a month ago.  -She is also concerned about Hx of Barrett  esophagus and acid reflux, She is reporting having colonoscopy and EGD, states that 3 months f/u with EGD was recommended but adds that she does not want to have another one. She took Prilosec and discontinued because it caused "irritation."  Reports frequent need to clear throat and clear sputum, she does not think it is post nasal drainage but rather acid reflux. She has not follow dietary recommendations, does not seem to be exacerbated by food intake, including spicy food.  Hx of diverticulosis, requesting abdominal CT to evaluate for "cancer." She  has followed with GI and reporting colonoscopy as current. Hx of IBS-C, denies changes in bowel habits, abnormal wt loss, blood in stool or melena.   Diabetes Mellitus II:   Dx about 16 years ago. She is doing better with her diet, stopped sodas, decreased bread intake.  Currently on Metformin 500 mg daily. Problem has been stable. Checking BS's : 130-160's Hypoglycemia:Denies  She is tolerating medications well. She denies abdominal pain, nausea, vomiting, polydipsia, polyuria, or polyphagia. No numbness, tingling, or burning.  She is active around her house, she does not exercise regularly but planning on doing so.   Lab Results  Component Value Date   CREATININE 0.7 07/24/2015   BUN 12 07/24/2015   NA 142 07/24/2015   K 3.8 07/24/2015   CL 107 08/06/2009   CO2 25 08/06/2009    Lab Results  Component Value Date   HGBA1C 7.3 07/24/2015   HTN: She is on Amlodipine ? Mg daily. Denies severe/frequent headache, visual changes, chest pain, palpitation, claudication, focal weakness, or edema. Hx of OSA, does not wear CPAP, felt "sophocaided."   Hx of macular degeneration, follows with ophthalmologists regularly.  She lives alone, daughter closed by.    Review of Systems  Constitutional: Negative for activity change, appetite change, fatigue, fever and unexpected weight change.  HENT: Positive for dental problem and postnasal drip. Negative for mouth sores, nosebleeds and trouble swallowing.   Eyes: Negative for pain, redness and visual disturbance.  Respiratory: Positive for shortness of breath and  wheezing. Negative for cough.   Cardiovascular: Negative for chest pain, palpitations and leg swelling.  Gastrointestinal: Positive for nausea. Negative for abdominal pain, blood in stool and vomiting.       Negative for changes in bowel habits.  Endocrine: Negative for polydipsia, polyphagia and polyuria.  Genitourinary: Negative for decreased urine volume, difficulty  urinating and hematuria.  Musculoskeletal: Positive for arthralgias, back pain and neck pain.  Skin: Negative for rash.  Allergic/Immunologic: Positive for environmental allergies.  Neurological: Negative for syncope, weakness, numbness and headaches.  Hematological: Negative for adenopathy. Does not bruise/bleed easily.  Psychiatric/Behavioral: Negative for confusion. The patient is nervous/anxious.       Current Outpatient Prescriptions on File Prior to Visit  Medication Sig Dispense Refill  . AmLODIPine Besylate (NORVASC PO) Take 10 mg by mouth daily.     . magnesium 30 MG tablet Take 30 mg by mouth daily.     No current facility-administered medications on file prior to visit.      Past Medical History:  Diagnosis Date  . Arthritis   . Barrett's esophagus   . Diabetes mellitus   . GERD (gastroesophageal reflux disease)   . Hyperlipidemia   . Hypertension   . Sleep apnea    does not tolerate cpap   No Known Allergies  Family History  Problem Relation Age of Onset  . Heart disease Father   . Colon cancer Neg Hx   . Stomach cancer Neg Hx   . Colon polyps Neg Hx   . Rectal cancer Neg Hx     Social History   Social History  . Marital status: Widowed    Spouse name: N/A  . Number of children: 3  . Years of education: N/A   Occupational History  . Retired Unemployed   Social History Main Topics  . Smoking status: Former Smoker    Packs/day: 0.80    Types: Cigarettes    Quit date: 09/15/2011  . Smokeless tobacco: Never Used  . Alcohol use No  . Drug use: No  . Sexual activity: Not Asked   Other Topics Concern  . None   Social History Narrative  . None    Vitals:   02/17/16 1255 02/17/16 1405  BP: 138/80   Pulse: (!) 56 60  Resp: 12    O2 sat at RA 98%  Body mass index is 46.41 kg/m.     Physical Exam  Nursing note and vitals reviewed. Constitutional: She is oriented to person, place, and time. She appears well-developed. No distress.    HENT:  Head: Atraumatic.  Mouth/Throat: Oropharynx is clear and moist and mucous membranes are normal.  Eyes: Conjunctivae and EOM are normal. Pupils are equal, round, and reactive to light.  Neck: No JVD present.  Cardiovascular: Normal rate and regular rhythm.   No murmur heard. Pulses:      Dorsalis pedis pulses are 2+ on the right side, and 2+ on the left side.  Respiratory: Effort normal and breath sounds normal. No respiratory distress.  GI: Soft. She exhibits no mass. There is no hepatomegaly. There is no tenderness.  Musculoskeletal: She exhibits edema (2+ LE pitting edema bilateral.). She exhibits no tenderness.  Pain upon palpation of cervical paraspinal muscles, left, limitation of cervical ROM.  Neurological: She is alert and oriented to person, place, and time. She has normal strength. Coordination and gait normal.  Skin: Skin is warm. No erythema.  Psychiatric: Her mood appears anxious.  Well groomed, good eye contact.  Diabetic foot exam:  Monofilament normal bilateral. Peripheral pulses present (DP). Flat feet. No calluses No hypertrophic/long toenails.    ASSESSMENT AND PLAN:     Kylie Wilson was seen today for establish care.  Diagnoses and all orders for this visit:  Dyspnea, unspecified type  We discussed possible etiologies:COPD,deconditioning, cardiac,GERD among some. Instructed about warning signs.  -     CT Chest W Contrast; Future  Gastroesophageal reflux disease, esophagitis presence not specified  GERD precautions discussed. Wt loss and GERD precautions recommended. She will try Nexium. F/U in 6 weeks.  -     esomeprazole (NEXIUM) 40 MG capsule; Take 1 capsule (40 mg total) by mouth daily at 12 noon.  Essential hypertension  Reporting BP's at home lower than BP here today. Adequately controlled. No changes in current management. DASH diet recommended. Eye exam recommended annually. F/U in 6 months, before if needed.  -     Basic  metabolic panel  Diabetes mellitus type 2, noninsulin dependent (HCC)  HgA1C pending. No changes in current management but treatment may need to be adjusted depending of HgA1C. Regular exercise and healthy diet with avoidance of added sugar food intake is an important part of treatment and recommended. Annual eye exam, periodic dental and foot care recommended. F/U in 4-6 months  -     HgB A1c -     Urine Microalbumin w/creat. ratio  Obstructive sleep apnea syndrome  Adverse effects of untreated OSA. She is not willing to try CPAP again. Wt loss may help.   Chronic bronchitis, unspecified chronic bronchitis type (HCC)  Not well controlled and could be contributing to her exertional dyspnea. Symbicort 80-4.5 mcg bid. Albuterol inh q 6 hrs for a week then as needed. Some side effects of medications discussed. F/U in 6 weeks, before if needed.  -     budesonide-formoterol (SYMBICORT) 80-4.5 MCG/ACT inhaler; Inhale 2 puffs into the lungs 2 (two) times daily. -     albuterol (PROVENTIL HFA;VENTOLIN HFA) 108 (90 Base) MCG/ACT inhaler; Inhale 1-2 puffs into the lungs every 6 (six) hours as needed for wheezing or shortness of breath.  BMI 45.0-49.9, adult (HCC)  We discussed benefits of wt loss as well as adverse effects of obesity. Consistency with healthy diet and physical activity recommended. Smaller meal portions as well as daily brisk walking for 15-30 min as tolerated.     Refused vaccines: Influenza and Prevnar 13.       Cameo Schmiesing G. [place], MD  [facility]. [place] office.

## 2016-02-18 ENCOUNTER — Other Ambulatory Visit: Payer: Self-pay

## 2016-02-18 MED ORDER — METFORMIN HCL 500 MG PO TABS: ORAL_TABLET | ORAL | 0 refills | Status: DC

## 2016-02-19 ENCOUNTER — Telehealth: Payer: Self-pay | Admitting: Family Medicine

## 2016-02-19 ENCOUNTER — Emergency Department (HOSPITAL_COMMUNITY)
Admission: EM | Admit: 2016-02-19 | Discharge: 2016-02-19 | Disposition: A | Discharge status: Home / Self Care | Payer: Medicare Other | Attending: Emergency Medicine | Admitting: Emergency Medicine

## 2016-02-19 ENCOUNTER — Encounter (HOSPITAL_COMMUNITY): Payer: Self-pay | Admitting: Emergency Medicine

## 2016-02-19 DIAGNOSIS — I10 Essential (primary) hypertension: Secondary | ICD-10-CM | POA: Insufficient documentation

## 2016-02-19 DIAGNOSIS — E119 Type 2 diabetes mellitus without complications: Secondary | ICD-10-CM | POA: Diagnosis not present

## 2016-02-19 DIAGNOSIS — Z79899 Other long term (current) drug therapy: Secondary | ICD-10-CM | POA: Diagnosis not present

## 2016-02-19 DIAGNOSIS — J449 Chronic obstructive pulmonary disease, unspecified: Secondary | ICD-10-CM | POA: Insufficient documentation

## 2016-02-19 DIAGNOSIS — Z87891 Personal history of nicotine dependence: Secondary | ICD-10-CM | POA: Insufficient documentation

## 2016-02-19 DIAGNOSIS — M62830 Muscle spasm of back: Secondary | ICD-10-CM | POA: Insufficient documentation

## 2016-02-19 DIAGNOSIS — Z7984 Long term (current) use of oral hypoglycemic drugs: Secondary | ICD-10-CM | POA: Diagnosis not present

## 2016-02-19 DIAGNOSIS — R109 Unspecified abdominal pain: Secondary | ICD-10-CM | POA: Diagnosis present

## 2016-02-19 LAB — BASIC METABOLIC PANEL
Anion gap: 7 (ref 5–15)
BUN: 9 mg/dL (ref 6–20)
CO2: 26 mmol/L (ref 22–32)
Calcium: 9 mg/dL (ref 8.9–10.3)
Chloride: 103 mmol/L (ref 101–111)
Creatinine, Ser: 0.75 mg/dL (ref 0.44–1.00)
GFR calc Af Amer: >60 mL/min (ref >60)
GFR calc non Af Amer: >60 mL/min (ref >60)
Glucose, Bld: 170 mg/dL — ABNORMAL HIGH (ref 65–99)
Potassium: 3.8 mmol/L (ref 3.5–5.1)
Sodium: 136 mmol/L (ref 135–145)

## 2016-02-19 LAB — CBC
HCT: 38.5 % (ref 36.0–46.0)
Hemoglobin: 12.4 g/dL (ref 12.0–15.0)
MCH: 26 pg (ref 26.0–34.0)
MCHC: 32.2 g/dL (ref 30.0–36.0)
MCV: 80.7 fL (ref 78.0–100.0)
Platelets: 191 10*3/uL (ref 150–400)
RBC: 4.77 MIL/uL (ref 3.87–5.11)
RDW: 14.8 % (ref 11.5–15.5)
WBC: 6.3 10*3/uL (ref 4.0–10.5)

## 2016-02-19 LAB — URINALYSIS, ROUTINE W REFLEX MICROSCOPIC
Bilirubin Urine: NEGATIVE
Glucose, UA: NEGATIVE mg/dL
Hgb urine dipstick: NEGATIVE
Ketones, ur: NEGATIVE mg/dL
Leukocytes, UA: NEGATIVE
Nitrite: NEGATIVE
Protein, ur: NEGATIVE mg/dL
Specific Gravity, Urine: 1.013 (ref 1.005–1.030)
pH: 6 (ref 5.0–8.0)

## 2016-02-19 MED ORDER — ACETAMINOPHEN 500 MG PO TABS
1000.0000 mg | ORAL_TABLET | Freq: Once | ORAL | Status: AC
  Administered 2016-02-19: 1000 mg via ORAL
  Filled 2016-02-19: qty 2

## 2016-02-19 NOTE — ED Provider Notes (Signed)
[place]ains of right flank pain onset yesterday pain is worse with changing positions improved with remaining still. No fever. No abdominal pain No other associated symptoms. On exam she is alert and in no distress lungs clear auscultation heart regular rate and rhythm abdomen morbidly obese, nontender. She has pain at left flank upon sitting up from a supine position. Pain felt to be muscular in etiology   Doug SouSam Claudius Mich, MD 02/19/16 1300

## 2016-02-19 NOTE — ED Notes (Signed)
Pt ambulated to and from restroom without issue

## 2016-02-19 NOTE — Telephone Encounter (Signed)
[facility] [place] [facility] - Client TELEPHONE ADVICE RECORD [facility] Patient Name: Kylie KlinefelterJANICE Desch DOB: 05/18/1950 Initial Comment Caller states she's having Kidney pain. Nurse Assessment Nurse: Kylie AnnaHensel, Kylie Wilson, Kylie Wilson Date/Time (Eastern Time): 02/19/2016 10:04:14 AM Confirm and document reason for call. If symptomatic, describe symptoms. ---Caller states, she is having pain in her lower back. If she moves a certain it bends her over the pain. It is on the back close to the left side. Caller denies fever. Does the patient have any new or worsening symptoms? ---Yes Will a triage be completed? ---Yes Related visit to physician within the last 2 weeks? ---No Does the PT have any chronic conditions? (i.e. diabetes, asthma, etc.) ---Yes List chronic conditions. ---diabetes, htn Is this a behavioral health or substance abuse call? ---No Guidelines Guideline Title Affirmed Question Affirmed Notes Back Pain [1] SEVERE back pain (e.g., excruciating) AND [2] sudden onset AND [3] age > 2260 Final Disposition User Go to ED Now Hensel, Kylie Wilson, Kylie Wilson [facility] Disagree/Comply: Comply

## 2016-02-19 NOTE — Discharge Instructions (Signed)
SEEK IMMEDIATE MEDICAL ATTENTION IF: New numbness, tingling, weakness, or problem with the use of your arms or legs.  Severe back pain not relieved with medications.  Change in bowel or bladder control.  Increasing pain in any areas of the body (such as chest or abdominal pain).  Shortness of breath, dizziness or fainting.  Nausea (feeling sick to your stomach), vomiting, fever, or sweats.  

## 2016-02-19 NOTE — Telephone Encounter (Signed)
See below

## 2016-02-19 NOTE — Telephone Encounter (Signed)
Patient is currently at the ED. 

## 2016-02-19 NOTE — ED Triage Notes (Signed)
L flank pain with no urinary symptoms.   Patient denies other symptoms.

## 2016-02-19 NOTE — Telephone Encounter (Signed)
Pt has checked in to [facility] ED for evaluation and treatment.

## 2016-02-19 NOTE — ED Provider Notes (Signed)
[facility]-EMERGENCY DEPT Provider Note   CSN: [id number] Arrival date & time: 02/19/16  1050     History   Chief Complaint Chief Complaint  Patient presents with  . Flank Pain    HPI Kylie Wilson is a 65 y.o. female with morbid obesity and diabetes who presents emergency Department with chief complaint of left flank pain. She States That It Came on All Of A Sudden Last Night. The Pain Is Moderate to Severe and Rates It at an 8 Out Of 10. She States It Is Aching When She Sits Still, but Severe When She Twists to the Left. She Denies Any Urinary Symptoms, Fevers, Chills. She States She Had the Same Pain Last Week but It Seemed to Go Away by Itself. She Has No Known History of Kidney Stones. The Patient Was in Touch with Her Primary Care Physician Today. They Were Concerned for Possible Renal Dysfunction and Had Her Present to the Emergency Department for Evaluation.    HPI  Past Medical History:  Diagnosis Date  . Arthritis   . Barrett's esophagus   . Diabetes mellitus   . GERD (gastroesophageal reflux disease)   . Hyperlipidemia   . Hypertension   . Sleep apnea    does not tolerate cpap    Patient Active Problem List   Diagnosis Date Noted  . Diabetes mellitus type 2, noninsulin dependent (HCC) 02/17/2016  . COPD (chronic obstructive pulmonary disease) (HCC) 02/17/2016  . Unspecified constipation 09/22/2011  . HYPERLIPIDEMIA 08/27/2009  . DEPRESSION 08/27/2009  . Essential hypertension 08/27/2009  . Sleep apnea 08/27/2009  . DYSPNEA 08/27/2009  . BARRETTS ESOPHAGUS 08/07/2009  . ESOPHAGEAL REFLUX 08/07/2008  . COUGH 08/07/2008  . DIVERTICULOSIS OF COLON 11/17/2007    Past Surgical History:  Procedure Laterality Date  . BREAST BIOPSY  1980   left  . SCAR REVISION  2001   both legs  . TOE SURGERY  1981   4th and 5th toe right foot  . TOE SURGERY  2002   great toe left    OB History    No data available       Home Medications    Prior to Admission  medications   Medication Sig Start Date End Date Taking? Authorizing Provider  albuterol (PROVENTIL HFA;VENTOLIN HFA) 108 (90 Base) MCG/ACT inhaler Inhale 1-2 puffs into the lungs every 6 (six) hours as needed for wheezing or shortness of breath. 02/17/16  Yes Betty G Swazil[place]Jordan, MD  AmLODIPine Besylate (NORVASC PO) Take 10 mg by mouth daily.    Yes Historical Provider, MD  budesonide-formoterol (SYMBICORT) 80-4.5 MCG/ACT inhaler Inhale 2 puffs into the lungs 2 (two) times daily. 02/17/16  Yes Betty G [place]Jordan, MD  esomeprazole (NEXIUM) 40 MG capsule Take 1 capsule (40 mg total) by mouth daily at 12 noon. 02/17/16  Yes Betty G [place]Jordan, MD  metFORMIN (GLUCOPHAGE) 500 MG tablet Take 1 tablet by mouth with breakfast and two tablets with supper. Patient taking differently: Take 500 mg by mouth.  02/18/16  Yes Betty G [place]Jordan, MD  magnesium 30 MG tablet Take 30 mg by mouth daily.    Historical Provider, MD    Family History Family History  Problem Relation Age of Onset  . Heart disease Father   . Colon cancer Neg Hx   . Stomach cancer Neg Hx   . Colon polyps Neg Hx   . Rectal cancer Neg Hx     Social History Social History  Substance Use Topics  . Smoking  status: Former Smoker    Packs/day: 0.80    Types: Cigarettes    Quit date: 09/15/2011  . Smokeless tobacco: Never Used  . Alcohol use No     Allergies   Patient has no known allergies.   Review of Systems Review of Systems Ten systems reviewed and are negative for acute change, except as noted in the HPI.    Physical Exam Updated Vital Signs BP 128/56   Pulse 60   Temp 98.4 F (36.9 C) (Oral)   Resp 16   Ht 5\' 3"  (1.6 m)   Wt 118.8 kg   SpO2 96%   BMI 46.41 kg/m   Physical Exam  Constitutional: She is oriented to person, place, and time. She appears well-developed and well-nourished. No distress.  HENT:  Head: Normocephalic and atraumatic.  Eyes: Conjunctivae are normal. No scleral icterus.  Neck: Normal range of motion.    Cardiovascular: Normal rate, regular rhythm and normal heart sounds.  Exam reveals no gallop and no friction rub.   No murmur heard. Pulmonary/Chest: Effort normal and breath sounds normal. No respiratory distress.  Abdominal: Soft. Bowel sounds are normal. She exhibits no distension and no mass. There is no tenderness. There is no guarding.  No CVA tenderness  Musculoskeletal:  Patient examined in the sitting position.  Point tender in the left Quadratus Lumborum Pain with twisting to the Left.   Neurological: She is alert and oriented to person, place, and time.  Skin: Skin is warm and dry. She is not diaphoretic.  Nursing note and vitals reviewed.    ED Treatments / Results  Labs (all labs ordered are listed, but only abnormal results are displayed) Labs Reviewed  BASIC METABOLIC PANEL - Abnormal; Notable for the following:       Result Value   Glucose, Bld 170 (*)    All other components within normal limits  URINALYSIS, ROUTINE W REFLEX MICROSCOPIC  CBC    EKG  EKG Interpretation None       Radiology No results found.  Procedures Procedures (including critical care time)  Medications Ordered in ED Medications - No data to display   Initial Impression / Assessment and Plan / ED Course  I have reviewed the triage vital signs and the nursing notes.  Pertinent labs & imaging results that were available during my care of the patient were reviewed by me and considered in my medical decision making (see chart for details).  Clinical Course     Patient with back pain.  No neurological deficits and normal neuro exam.  Negative urine. Blood sugars elevated. Patient can walk but states is painful.  No loss of bowel or bladder control.  No concern for cauda equina.  No fever, night sweats, weight loss, h/o cancer, IVDU.  RICE protocol and pain medicine indicated and discussed with patient.   Final Clinical Impressions(s) / ED Diagnoses   Final diagnoses:  None     New Prescriptions New Prescriptions   No medications on file     Arthor Captainbigail Harbor Vanover, PA-C 02/19/16 1312    Doug SouSam Jacubowitz, MD 02/19/16 1313

## 2016-02-19 NOTE — Telephone Encounter (Signed)
Pt states she is having pain in her upper left side pain.  Pt states since she was to have protein in her urine, she is concerned may be kidney issues. Pt states she also drank vinegar and water and not sure if she over did it. Pt states the pain has gotten severe, transferred her to triage.

## 2016-02-20 ENCOUNTER — Telehealth: Payer: Self-pay

## 2016-02-20 NOTE — Telephone Encounter (Signed)
Back pain could be muscular and usually resolves in a few weeks. Please schedule ER follow up. Thanks, BJ

## 2016-02-20 NOTE — Telephone Encounter (Signed)
Physician SwazilandJordan, Betty - MD Contact Type Call Who Is Calling Patient / Member / Family / Caregiver Caller Name Kylie PenJanise Decaire Caller Phone Number [phone] Patient Name Kylie Wilson Call Type Message Only Information Provided Reason for Call Request for General Office Information Initial Comment Caller states, she is till having pain and was told to follow up with dr. Kathie RhodesBetty - flank pain(kidney issues). they did not do a lot of testing at ER. Additional Comment: Needs a call from her dr. Kathie RhodesBetty.   Spoke with pt and she states that she was seen in ED yesterday. She does not feel that they did enough testing to find out what is wrong. Pt reports that her blood sugar has been elevated more recently. This morning it was 161 fasting. She reports that she has improved her diet and is not sure why her blood sugar is still elevated. In regards to pain, pt reports that she does have a pinched nerve in her neck and wonders if this is also contributing. Pt does also mention that she did move 2 pieces of furniture the other day... Pt denies any swelling, rashes or inflamed areas of skin.   Dr. SwazilandJordan - Please advise. Thanks!

## 2016-02-23 NOTE — Telephone Encounter (Signed)
LMTCB

## 2016-02-23 NOTE — Telephone Encounter (Signed)
Will you please schedule patient for an ER follow up? Thank you!

## 2016-03-02 ENCOUNTER — Ambulatory Visit
Admission: RE | Admit: 2016-03-02 | Discharge: 2016-03-02 | Disposition: A | Discharge status: Home / Self Care | Payer: Medicare Other | Source: Ambulatory Visit | Attending: Family Medicine | Admitting: Family Medicine

## 2016-03-02 DIAGNOSIS — R079 Chest pain, unspecified: Secondary | ICD-10-CM | POA: Diagnosis not present

## 2016-03-02 DIAGNOSIS — R0600 Dyspnea, unspecified: Secondary | ICD-10-CM

## 2016-03-02 MED ORDER — IOPAMIDOL (ISOVUE-300) INJECTION 61%
75.0000 mL | Freq: Once | INTRAVENOUS | Status: AC | PRN
  Administered 2016-03-02: 75 mL via INTRAVENOUS

## 2016-03-11 NOTE — Telephone Encounter (Signed)
Pt has been scheduled for 03/18/16

## 2016-03-16 DIAGNOSIS — H11131 Conjunctival pigmentations, right eye: Secondary | ICD-10-CM | POA: Diagnosis not present

## 2016-03-16 DIAGNOSIS — H2513 Age-related nuclear cataract, bilateral: Secondary | ICD-10-CM | POA: Diagnosis not present

## 2016-03-16 DIAGNOSIS — E113213 Type 2 diabetes mellitus with mild nonproliferative diabetic retinopathy with macular edema, bilateral: Secondary | ICD-10-CM | POA: Diagnosis not present

## 2016-03-16 DIAGNOSIS — H40013 Open angle with borderline findings, low risk, bilateral: Secondary | ICD-10-CM | POA: Diagnosis not present

## 2016-03-18 ENCOUNTER — Ambulatory Visit: Payer: Medicare Other | Admitting: Family Medicine

## 2016-03-29 DIAGNOSIS — E113211 Type 2 diabetes mellitus with mild nonproliferative diabetic retinopathy with macular edema, right eye: Secondary | ICD-10-CM | POA: Diagnosis not present

## 2016-03-29 DIAGNOSIS — H35033 Hypertensive retinopathy, bilateral: Secondary | ICD-10-CM | POA: Diagnosis not present

## 2016-03-29 DIAGNOSIS — H25813 Combined forms of age-related cataract, bilateral: Secondary | ICD-10-CM | POA: Diagnosis not present

## 2016-03-29 DIAGNOSIS — E113292 Type 2 diabetes mellitus with mild nonproliferative diabetic retinopathy without macular edema, left eye: Secondary | ICD-10-CM | POA: Diagnosis not present

## 2016-04-06 ENCOUNTER — Encounter: Payer: Self-pay | Admitting: Family Medicine

## 2016-04-06 ENCOUNTER — Ambulatory Visit (INDEPENDENT_AMBULATORY_CARE_PROVIDER_SITE_OTHER): Payer: Medicare Other | Admitting: Family Medicine

## 2016-04-06 VITALS — BP 158/80 | HR 86 | Resp 12 | Ht 63.0 in | Wt 268.1 lb

## 2016-04-06 DIAGNOSIS — Z0000 Encounter for general adult medical examination without abnormal findings: Secondary | ICD-10-CM

## 2016-04-06 DIAGNOSIS — J449 Chronic obstructive pulmonary disease, unspecified: Secondary | ICD-10-CM | POA: Diagnosis not present

## 2016-04-06 DIAGNOSIS — I10 Essential (primary) hypertension: Secondary | ICD-10-CM | POA: Diagnosis not present

## 2016-04-06 DIAGNOSIS — K219 Gastro-esophageal reflux disease without esophagitis: Secondary | ICD-10-CM

## 2016-04-06 DIAGNOSIS — Z6841 Body Mass Index (BMI) 40.0 and over, adult: Secondary | ICD-10-CM

## 2016-04-06 MED ORDER — BENAZEPRIL HCL 10 MG PO TABS: 10.0000 mg | ORAL_TABLET | Freq: Every day | ORAL | 3 refills | Status: DC

## 2016-04-06 NOTE — Progress Notes (Signed)
Pre visit review using our clinic review tool, if applicable. No additional management support is needed unless otherwise documented below in the visit note. 

## 2016-04-06 NOTE — Patient Instructions (Addendum)
A few things to remember from today's visit:   Essential hypertension - Plan: benazepril (LOTENSIN) 10 MG tablet  Chronic bronchitis, unspecified chronic bronchitis type (HCC)  Benazepril was added. Symbicort daily. Blood pressure goal for most people is less than 140/90.Some populations (older than 60) the goal is less than 150/90.  Elevated blood pressure increases the risk of strokes, heart and kidney disease, and eye problems. Regular physical activity and a healthy diet (DASH diet) usually help. Low salt diet. Take medications as instructed. Caution with some over the counter medications as cold medications, dietary products (for weight loss), and Ibuprofen or Aleve (frequent use);all these medications could cause elevation of blood pressure.   What are some tips for weight loss? People become overweight for many reasons. Weight issues can run in families. They can be caused by unhealthy behaviors and a person's environment. Certain health problems and medicines can also lead to weight gain. There are some simple things you can do to reach and maintain a healthy weight:  Eat small more frequent healthy meals instead 3 bid meals. Also Weight Watchers is a good option. Avoid sweet drinks. These include regular soft drinks, fruit juices, fruit drinks, energy drinks, sweetened iced tea, and flavored milk. Avoid fast foods. Fast foods such as french fries, hamburgers, chicken nuggets, and pizza are high in calories and can cause weight gain. Eat a healthy breakfast. People who skip breakfast tend to weigh more. Don't watch more than two hours of television per day. Chew sugar-free gum between meals to cut down on snacking. Avoid grocery shopping when you're hungry. Pack a healthy lunch instead of eating out to control what and how much you eat. Eat a lot of fruits and vegetables. Aim for about 2 cups of fruit and 2 to 3 cups of vegetables per day. Aim for 150 minutes per week of  moderate-intensity exercise (such as brisk walking), or 75 minutes per week of vigorous exercise (such as jogging or running). OR 15-30 min of daily brisk walking. Be more active. Small changes in physical activity can easily be added to your daily routine. For example, take the stairs instead of the elevator. Take a walk with your family. A daily walk is a great way to get exercise and to catch up on the day's events.   Please be sure medication list is accurate. If a new problem present, please set up appointment sooner than planned today.

## 2016-04-06 NOTE — Progress Notes (Signed)
HPI:   [name] is a 67 y.o. female, who is here today to follow on some problems addressed last OV, 02/17/16.  Exertional dyspnea and wheezing:  02/17/16 she was c/o intermittent wheezing and exertional dyspnea, Hx of COPD. Recommended Symbicort and Albuterol inh. She is using both inhalers as needed and reporting that "breathing is a little" better. Inhalers in general seem to help.  She also had chest CT, which she requested because was afrain of having "lung cancer."  Chest CT was done 03/02/16, she read report and has several concerns, she states that she did not received results from [place].  No acute chest pathology evident.   Few scattered benign subpleural pulmonary nodules that do not require further follow-up.  Few low-density nodes in the right hilar region, the largest measuring 18 mm at the inferior hilum. As an isolated finding, these are not likely significant.  Thoracic aortic atherosclerosis. Coronary artery atherosclerosis. Small pericardial effusion.   -"Something growing inside" , concerned about abdominal circumference getting bigger. She has made some dietary changes but still not able to lose abdominal wt. She decreased amount of bread. She is not exercising regularly but planning on starting silver sneakers classes.  GERD: She is on Nexium 40 mg, started last OV. She has not noted heartburn. She does not have to clear throat as frequent as she was doing to. Hx of Barrett esophagus. Denies abdominal pain, nausea, vomiting, changes in bowel habits, blood in stool or melena.  07/12/2008 normal gastric emptying study.  Hypertension: She is on Amlodipine 10 mg daily. BP elevated today. Denies severe/frequent headache, visual changes, chest pain, palpitation, claudication, focal weakness, or edema.  She is not checking BP at home.  Hx of OSA, she does not wear CPAP.  She had home visit by her health insurance, BP was 144/90. Some other  recommendations in regard to health care maintenance given: vaccination and mammogram.   Review of Systems  Constitutional: Negative for activity change, appetite change, fatigue, fever and unexpected weight change.  HENT: Negative for mouth sores, nosebleeds, trouble swallowing and voice change.   Eyes: Negative for pain, redness and visual disturbance.  Respiratory: Positive for shortness of breath and wheezing. Negative for cough.   Cardiovascular: Negative for chest pain, palpitations and leg swelling.  Gastrointestinal: Negative for abdominal pain, nausea and vomiting.       Negative for changes in bowel habits.  Genitourinary: Negative for decreased urine volume, difficulty urinating and hematuria.  Musculoskeletal: Positive for arthralgias, back pain and neck pain.  Skin: Negative for rash.  Allergic/Immunologic: Positive for environmental allergies.  Neurological: Negative for syncope, weakness and headaches.  Psychiatric/Behavioral: Negative for confusion. The patient is nervous/anxious.       Current Outpatient Prescriptions on File Prior to Visit  Medication Sig Dispense Refill  . albuterol (PROVENTIL HFA;VENTOLIN HFA) 108 (90 Base) MCG/ACT inhaler Inhale 1-2 puffs into the lungs every 6 (six) hours as needed for wheezing or shortness of breath. 1 Inhaler 1  . AmLODIPine Besylate (NORVASC PO) Take 10 mg by mouth daily.     . budesonide-formoterol (SYMBICORT) 80-4.5 MCG/ACT inhaler Inhale 2 puffs into the lungs 2 (two) times daily. 1 Inhaler 3  . esomeprazole (NEXIUM) 40 MG capsule Take 1 capsule (40 mg total) by mouth daily at 12 noon. 30 capsule 3  . magnesium 30 MG tablet Take 30 mg by mouth daily.    . metFORMIN (GLUCOPHAGE) 500 MG tablet Take 1 tablet by  mouth with breakfast and two tablets with supper. (Patient taking differently: Take 500 mg by mouth. ) 270 tablet 0   No current facility-administered medications on file prior to visit.      Past Medical History:    Diagnosis Date  . Arthritis   . Barrett's esophagus   . Diabetes mellitus   . GERD (gastroesophageal reflux disease)   . Hyperlipidemia   . Hypertension   . Sleep apnea    does not tolerate cpap   No Known Allergies  Social History   Social History  . Marital status: Widowed    Spouse name: N/A  . Number of children: 3  . Years of education: N/A   Occupational History  . Retired Unemployed   Social History Main Topics  . Smoking status: Former Smoker    Packs/day: 0.80    Types: Cigarettes    Quit date: 09/15/2011  . Smokeless tobacco: Never Used  . Alcohol use No  . Drug use: No  . Sexual activity: Not Asked   Other Topics Concern  . None   Social History Narrative  . None    Vitals:   04/06/16 1527  BP: (!) 158/80  Pulse: 86  Resp: 12   O2 sat at RA 94%  Body mass index is 47.5 kg/m.   Wt Readings from Last 3 Encounters:  04/06/16 268 lb 2 oz (121.6 kg)  02/19/16 262 lb (118.8 kg)  02/17/16 262 lb (118.8 kg)    Physical Exam  Nursing note and vitals reviewed. Constitutional: She is oriented to person, place, and time. She appears well-developed. No distress.  HENT:  Head: Atraumatic.  Mouth/Throat: Oropharynx is clear and moist and mucous membranes are normal.  Eyes: Conjunctivae and EOM are normal. Pupils are equal, round, and reactive to light.  Cardiovascular: Normal rate and regular rhythm.   No murmur heard. Pulses:      Dorsalis pedis pulses are 2+ on the right side, and 2+ on the left side.  Respiratory: Effort normal and breath sounds normal. No respiratory distress.  GI: Soft. She exhibits no mass. There is no hepatomegaly. There is no tenderness.  Musculoskeletal: She exhibits edema (1+ pitting edema bilateral, LE). She exhibits no tenderness.  Lymphadenopathy:    She has no cervical adenopathy.  Neurological: She is alert and oriented to person, place, and time. She has normal strength. Coordination and gait normal.  Skin: Skin  is warm. No erythema.  Psychiatric: Her mood appears anxious.  Well groomed, good eye contact.      ASSESSMENT AND PLAN:     Kylie Wilson was seen today for follow-up.  Diagnoses and all orders for this visit:  BMI 45.0-49.9, adult (HCC)  Gained about 6 pounds sine her last OV. Abdominal obesity, examination does not suggest abdominal masses or ascitics. We discussed benefits of wt loss as well as adverse effects of obesity. Consistency with healthy diet and physical activity recommended.   Essential hypertension  Not well controlled. Possible complications of elevated BP discussed. Benazepril added, she does not recall trying this medication before. No changes in Amlodipine. BP at home if possible. F/U in 2 months.  -     benazepril (LOTENSIN) 10 MG tablet; Take 1 tablet (10 mg total) by mouth daily. -     Basic metabolic panel; Future  COPD (chronic obstructive pulmonary disease) with chronic bronchitis (HCC)  Today lung auscultation normal. Symbicort 80-4.5 mcg to use daily bid. Albuterol inh as needed. Wt loss may help.  Gastroesophageal reflux disease, esophagitis presence not specified  Asymptomatic. Tolerating medications well. GERD precautions discussed. No changes in current management.  Health care maintenance  She is not interested in influenza or Prevnar vaccine. Colonoscopy 2009. Mammogram 11/2014 [place] 1. She follows with gyn, Dr [facility]      -Ms. Kylie Wilson was advised to return sooner than planned today if new concerns arise.       Betty G. SwazilandJordan, MD  [facility]. [place] [facility].

## 2016-04-11 DIAGNOSIS — J449 Chronic obstructive pulmonary disease, unspecified: Secondary | ICD-10-CM | POA: Insufficient documentation

## 2016-04-13 ENCOUNTER — Other Ambulatory Visit (INDEPENDENT_AMBULATORY_CARE_PROVIDER_SITE_OTHER): Payer: Medicare Other

## 2016-04-13 DIAGNOSIS — I10 Essential (primary) hypertension: Secondary | ICD-10-CM | POA: Diagnosis not present

## 2016-04-14 LAB — BASIC METABOLIC PANEL
BUN: 12 mg/dL (ref 6–23)
CO2: 26 mEq/L (ref 19–32)
Calcium: 9.3 mg/dL (ref 8.4–10.5)
Chloride: 104 mEq/L (ref 96–112)
Creatinine, Ser: 0.8 mg/dL (ref 0.40–1.20)
GFR: 92.32 mL/min (ref >60.00)
Glucose, Bld: 105 mg/dL — ABNORMAL HIGH (ref 70–99)
Potassium: 4 mEq/L (ref 3.5–5.1)
Sodium: 138 mEq/L (ref 135–145)

## 2016-05-07 ENCOUNTER — Other Ambulatory Visit: Payer: Self-pay | Admitting: Family Medicine

## 2016-05-20 ENCOUNTER — Other Ambulatory Visit: Payer: Self-pay | Admitting: Family Medicine

## 2016-06-03 NOTE — Progress Notes (Addendum)
HPI:   [name] is a 66 y.o. female, who is here today to follow on some chronic medical problems.  Last seen 04/06/16.   Hypertension:   Currently on Amlodipine 10 mg daily, she discontinued  Lotensin 10 mg because it was causing cough. Home BP readings: 140's-150's/80's  + OSA, not on CPAP, states that she is not interested in trying again. Denies fatigue or morning headaches.   She has not noted unusual headache, visual changes, exertional chest pain, dyspnea,  focal weakness, or edema.   Lab Results  Component Value Date   CREATININE 0.80 04/13/2016   BUN 12 04/13/2016   NA 138 04/13/2016   K 4.0 04/13/2016   CL 104 04/13/2016   CO2 26 04/13/2016   COPD:  Last OV recommended to use Symbicort 80-4.5 mcg bid daily. Exertional dyspnea and cough have resolved. Still mild wheezing in the morning when she first gets up, lasts a few minutes.    Diabetes Mellitus II:   Currently on Metformin 500 mg tid (instead 500 mg am and 1000 mg pm).  Checking BS's : 200's Hypoglycemia:Denies  She is tolerating medications well. She denies polydipsia, polyuria, or polyphagia.     Lab Results  Component Value Date   HGBA1C 7.9 (H) 02/17/2016   Lab Results  Component Value Date   MICROALBUR <0.7 02/17/2016    Lab Results  Component Value Date   CHOL 182 07/24/2015   HDL 65 07/24/2015   LDLCALC 103 07/24/2015   TRIG 69 07/24/2015   She has not been exercising regularly due to joint pain, Hx of OA and back pain.  She is trying to follow a healthy diet, she has not noted weight loss. She is concerned about "fat" accumulating on proximal aspect of upper and lower extremities as well as abdomen; and why "fat does not go down." She has felt some "knots" on abdomen,feels like it may be growing. States that she has felt them for years but "nobody" can find them. She has follow with GI in the past.   Denies abdominal pain, nausea, vomiting, changes in  bowel habits, blood in stool or melena.  Concerns today:   She needs a referral to cardiologist and pulmonologist. She already arranged an appointment with cardiologists, has appt 06/10/16. She denies any chest pain but she was concerned about some findings described on recent chest CT, done 03/02/16: Small amount of the cardiac floor with. Some coronary artery calcification and aortic atherosclerotic calcification changes. All of which we addressed last OV  She also would like to see a pulmonologist to continue following on COPD.   Review of Systems  Constitutional: Negative for activity change, appetite change, fatigue and unexpected weight change.  HENT: Negative for mouth sores, nosebleeds, trouble swallowing and voice change.   Eyes: Negative for pain and visual disturbance.  Respiratory: Positive for wheezing. Negative for cough and shortness of breath.   Cardiovascular: Negative for chest pain, palpitations and leg swelling.  Gastrointestinal: Negative for abdominal pain, nausea and vomiting.       Negative for changes in bowel habits.  Endocrine: Negative for polydipsia, polyphagia and polyuria.  Genitourinary: Negative for decreased urine volume, difficulty urinating and hematuria.  Skin: Negative for rash and wound.  Neurological: Negative for syncope, weakness and headaches.  Psychiatric/Behavioral: Negative for confusion. The patient is nervous/anxious.       Current Outpatient Prescriptions on File Prior to Visit  Medication Sig Dispense Refill  .  albuterol (PROVENTIL HFA;VENTOLIN HFA) 108 (90 Base) MCG/ACT inhaler Inhale 1-2 puffs into the lungs every 6 (six) hours as needed for wheezing or shortness of breath. 1 Inhaler 1  . amLODipine (NORVASC) 10 MG tablet TAKE 1 TABLET EVERY DAY 30 tablet 2  . budesonide-formoterol (SYMBICORT) 80-4.5 MCG/ACT inhaler Inhale 2 puffs into the lungs 2 (two) times daily. 1 Inhaler 3  . esomeprazole (NEXIUM) 40 MG capsule Take 1 capsule (40  mg total) by mouth daily at 12 noon. 30 capsule 3  . magnesium 30 MG tablet Take 30 mg by mouth daily.    . metFORMIN (GLUCOPHAGE) 500 MG tablet TAKE 1 TABLET BY MOUTH WITH BREAKFAST AND TWO TABLETS WITH SUPPER. 270 tablet 1   No current facility-administered medications on file prior to visit.      Past Medical History:  Diagnosis Date  . Arthritis   . Barrett's esophagus   . Diabetes mellitus   . GERD (gastroesophageal reflux disease)   . Hyperlipidemia   . Hypertension   . Sleep apnea    does not tolerate cpap   No Known Allergies  Social History   Social History  . Marital status: Widowed    Spouse name: N/A  . Number of children: 3  . Years of education: N/A   Occupational History  . Retired Unemployed   Social History Main Topics  . Smoking status: Former Smoker    Packs/day: 0.80    Years: 30.00    Types: Cigarettes    Quit date: 09/15/2011  . Smokeless tobacco: Never Used     Comment: smoked 20 years; then quit 10; smoked 30 years plus  . Alcohol use No  . Drug use: No  . Sexual activity: Not Asked   Other Topics Concern  . None   Social History Narrative  . None    Vitals:   06/04/16 0936  BP: 122/72  Pulse: 81  Resp: 12  O2 sat at RA 93%  Body mass index is 47.54 kg/m. Wt Readings from Last 3 Encounters:  06/04/16 268 lb 6 oz (121.7 kg)  04/06/16 268 lb 2 oz (121.6 kg)  02/19/16 262 lb (118.8 kg)     Physical Exam  Nursing note and vitals reviewed. Constitutional: She is oriented to person, place, and time. She appears well-developed. No distress.  HENT:  Head: Atraumatic.  Mouth/Throat: Oropharynx is clear and moist and mucous membranes are normal.  Eyes: Conjunctivae and EOM are normal. Pupils are equal, round, and reactive to light.  Cardiovascular: Normal rate and regular rhythm.   No murmur heard. Pulses:      Dorsalis pedis pulses are 2+ on the right side, and 2+ on the left side.  Respiratory: Effort normal and breath sounds  normal. No respiratory distress.  GI: Soft. She exhibits no mass. There is no tenderness.  Right above umbilicus the area she points to me I feel a 1-2 cm nodular lesion, mobile,no tender,does not change in size with valsalva.   Musculoskeletal: She exhibits edema (trace pitting edema bilateral, LE). She exhibits no tenderness.  Antalgic gait due to knee pain and lower back pain.  Lymphadenopathy:    She has no cervical adenopathy.  Neurological: She is alert and oriented to person, place, and time. She has normal strength. Coordination normal.  Stable gait with no assistance.  Skin: Skin is warm. No erythema.  Psychiatric: Her mood appears anxious.  Well groomed, good eye contact.      ASSESSMENT AND PLAN:  Kylie Wilson was seen today for follow-up and medicare wellness.  Diagnoses and all orders for this visit:  Diabetes mellitus type 2, noninsulin dependent (HCC)  HgA1C pending today,has not been at goal. No changes in current management, will adjust treatment if needed. Regular exercise and healthy diet with avoidance of added sugar food intake is an important part of treatment and recommended. Annual eye exam, periodic dental and foot care recommended. F/U in 4- months  -     Hemoglobin A1c -     Basic metabolic panel -     Fructosamine  Essential hypertension  Here today BP is adequately controlled, reporting elevated BPs at home. She agrees with trying losartan 25 mg daily. No changes in amlodipine. Continue low-salt diet. Follow-up in 4 months.  She will keep appt with cardiologists, referral placed.  -     losartan (COZAAR) 25 MG tablet; Take 1 tablet (25 mg total) by mouth daily. -     Ambulatory referral to Cardiology  Obstructive sleep apnea syndrome  We discussed adverse effects of OSA, she promised she will consider trying CPAP again. Referral to pulmonologist placed, Dr Vassie Loll.  -     Ambulatory referral to Pulmonology  Obesity due to excess  calories  Wt seems to be stable,no wt gain since her last OV. We discussed benefits of wt loss as well as adverse effects of obesity. Consistency with healthy diet and physical activity recommended.   -     Lipid panel  Chronic obstructive pulmonary disease, unspecified COPD type (HCC)  He seems to be better controlled. No changes in current management. Referral to pulmonology placed.  -     Ambulatory referral to Pulmonology   Abdominal obesity  Lesion she is concerned abut seems to be in abdominal wall, I think it is adipose tissue,? lipoma. Recently she had chest CT and upper abdomen was normal. Explained that  Abdominal wall lesions may be more noticeable with wt loss.  Reassured today, I will reviewed records and next OV we can re-evaluate,I do not feel intra abdominal masses and Hx does not suggest a serious illness. We may consider U/S. Monitor for new associated symptoms.     -Kylie Wilson was advised to return sooner than planned today if new concerns arise.       Betty G. [place], MD  [facility]. [place] office.

## 2016-06-04 ENCOUNTER — Ambulatory Visit (INDEPENDENT_AMBULATORY_CARE_PROVIDER_SITE_OTHER): Payer: Medicare Other | Admitting: Family Medicine

## 2016-06-04 ENCOUNTER — Encounter: Payer: Self-pay | Admitting: Family Medicine

## 2016-06-04 VITALS — BP 122/72 | HR 81 | Resp 12 | Ht 63.0 in | Wt 268.4 lb

## 2016-06-04 DIAGNOSIS — Z0000 Encounter for general adult medical examination without abnormal findings: Secondary | ICD-10-CM | POA: Diagnosis not present

## 2016-06-04 DIAGNOSIS — Z6841 Body Mass Index (BMI) 40.0 and over, adult: Secondary | ICD-10-CM | POA: Diagnosis not present

## 2016-06-04 DIAGNOSIS — E6609 Other obesity due to excess calories: Secondary | ICD-10-CM

## 2016-06-04 DIAGNOSIS — E119 Type 2 diabetes mellitus without complications: Secondary | ICD-10-CM | POA: Diagnosis not present

## 2016-06-04 DIAGNOSIS — I10 Essential (primary) hypertension: Secondary | ICD-10-CM | POA: Diagnosis not present

## 2016-06-04 DIAGNOSIS — E65 Localized adiposity: Secondary | ICD-10-CM

## 2016-06-04 DIAGNOSIS — G4733 Obstructive sleep apnea (adult) (pediatric): Secondary | ICD-10-CM | POA: Diagnosis not present

## 2016-06-04 DIAGNOSIS — IMO0001 Reserved for inherently not codable concepts without codable children: Secondary | ICD-10-CM

## 2016-06-04 DIAGNOSIS — E113293 Type 2 diabetes mellitus with mild nonproliferative diabetic retinopathy without macular edema, bilateral: Secondary | ICD-10-CM | POA: Diagnosis not present

## 2016-06-04 DIAGNOSIS — J449 Chronic obstructive pulmonary disease, unspecified: Secondary | ICD-10-CM | POA: Diagnosis not present

## 2016-06-04 LAB — BASIC METABOLIC PANEL
BUN: 12 mg/dL (ref 6–23)
CO2: 28 mEq/L (ref 19–32)
Calcium: 9 mg/dL (ref 8.4–10.5)
Chloride: 102 mEq/L (ref 96–112)
Creatinine, Ser: 0.69 mg/dL (ref 0.40–1.20)
GFR: 109.46 mL/min (ref >60.00)
Glucose, Bld: 236 mg/dL — ABNORMAL HIGH (ref 70–99)
Potassium: 4.2 mEq/L (ref 3.5–5.1)
Sodium: 138 mEq/L (ref 135–145)

## 2016-06-04 LAB — LIPID PANEL
Cholesterol: 197 mg/dL (ref 0–200)
HDL: 56 mg/dL (ref >39.00)
LDL Cholesterol: 126 mg/dL — ABNORMAL HIGH (ref 0–99)
NonHDL: 140.65
Total CHOL/HDL Ratio: 4
Triglycerides: 73 mg/dL (ref 0.0–149.0)
VLDL: 14.6 mg/dL (ref 0.0–40.0)

## 2016-06-04 LAB — HEMOGLOBIN A1C: Hgb A1c MFr Bld: 9.2 % — ABNORMAL HIGH (ref 4.6–6.5)

## 2016-06-04 MED ORDER — LOSARTAN POTASSIUM 25 MG PO TABS: 25.0000 mg | ORAL_TABLET | Freq: Every day | ORAL | 1 refills | Status: DC

## 2016-06-04 NOTE — Progress Notes (Signed)
Subjective:   Kylie Wilson is a 66 y.o. female who presents for an Initial Medicare Annual Wellness Visit.  HRA assessment completed during this visit Kylie Wilson The Patient was informed that the wellness visit is to identify future health risk and educate and initiate measures that can reduce risk for increased disease through the lifespan.    NO ROS; Medicare Wellness Visit Describes health as good, fair or great inbetween good and fiar Dtr in law; Kylie Wilson and stopped taking Vaccines Son is a therapist  She knows she would feel better if she lost weight  Psychosocial  Lost spouse Nov 2017 but he was in a nursing home for a long time prior to his death   Medical Hx; father had HD Meds Labs; trig 69; hdl 65  DM fast 105; A1c was 7.9   Primary Prevention Tobacco former smoker x 20 y; quit 10 years and then started smoking again  Difficult to determine pack years but had 30 years of smoking and not smoke a pack a day until the last 10 years of her smoking  ETOH - no States she stopped drinking any ETOH or smoking July 3; 2013   Diet  Coffee in am; [employer] delight creamer Changed to honey for sugar Uses whole wheat  Skips lunch Eats dinner;   Educated on her metabolism running slower due to not eating until the evening hours. Educated on eating mix of nutrients, shake, smoothie or other for breakfast and lunch Vegetables and fruits Given information on the Diabetes and weight management system if she should decide to go there.    BMI 47; given information on risk of obesity as well as strategies to lose weight   Exercise Will continue to walk around home x 30 - 5 days a week  Dental; goes when she saves the money  Given dental resources but needs teeth pulled   Stressors - financial stresses but in in fear of losing home; there is a shift in her SS since spouse expired   Safety Fall hx; no   Hearing Screening   125Hz  250Hz  500Hz  1000Hz  2000Hz  3000Hz  4000Hz   6000Hz  8000Hz   Right ear:       100    Left ear:       100    Comments: normal  Vision Screening Comments: Vision checks once  A year Can't think of the name of the [facility] [place]  They do the dilated eye exam     Do you have little interest or pleasure in doing things? no Have you been feeling down, depressed, hopeless? no PHQ9 waived or completed   Ad8 score reviewed for issues;  Issues making decisions; no  Less interest in hobbies / activities" no  Repeats questions, stories; family complaining: NO  Trouble using ordinary gadgets; microwave; computer: no  Forgets the month or year: no  Mismanaging finances: no   Missing apt: no but does write them down  Daily problems with thinking of memory NO Ad8 score is 0  MMSE not appropriate unless AD8 score is > 2   Advanced Directive agreed to review the [facility] form and given information  Overdue HM Hep c; educated and agreed to have drawn at next blood draw  Eye exam completes on an annual basis  Not sure which eye center on [address] [facility] [employer].   Tetanus will check with the pharmacy; educated regarding the Tdap  Mammogram 2013 - will order at  the breast center Dexa (no report) - will order at the breast center   Prevnar - declines Flu vaccine- declines  Colonoscopy 05/2007 due 05/2017   To keep her positive attitude toward making changes in her life  Goals    None     Depression Screen No flowsheet data found.  Fall Risk No flowsheet data found.  Cognitive Function:       Screening Tests Health Maintenance  Topic Date Due  . Hepatitis C Screening  [birth year]  . OPHTHALMOLOGY EXAM  05/18/1960  . TETANUS/TDAP  05/18/1969  . MAMMOGRAM  03/30/2013  . DEXA SCAN  05/19/2015  . PNA vac Low Risk Adult (1 of 2 - PCV13) 05/19/2015  . INFLUENZA VACCINE  10/14/2015  . HEMOGLOBIN A1C  08/17/2016  . FOOT EXAM  02/16/2017  . COLONOSCOPY  06/05/2017   Medicare now request all  "baby boomers" test for possible exposure to Hepatitis C. Many may have been exposed due to dental work, tatoo's, vaccinations when young. The Hepatitis C virus is dormant for many years and then sometimes will cause liver cancer. If you gave blood in the past 15 years, you were most likely checked for Hep C. If you rec'd blood; you may want to consider testing or if you are high risk for any other reason.   Mammogram  Bone density had one x 10 years ago;  Will go to the breast center and Telford Archambeau will order   A Tetanus is recommended every 10 years. Medicare covers a tetanus if you have a cut or wound; otherwise, there may be a charge. If you had not had a tetanus with pertusses, known as the Tdap, you can take this anytime.   Declines pneumonia series  Declines flu vaccine       Plan:     During the course of the visit, Kylie Wilson was educated and counseled about the following appropriate screening and preventive services:   Vaccines to include Pneumoccal, Influenza, Hepatitis B, Td, Zostavax, HCV  Electrocardiogram  Cardiovascular disease screening  Colorectal cancer screening  Bone density screening  Diabetes screening  Glaucoma screening  Mammography/PAP  Nutrition counseling  Smoking cessation counseling  Patient Instructions (the written plan) were given to the patient.    Kylie Muraski, Kylie Wilson   06/04/2016     Cardiac Risk Factors include: advanced age (>58men, >47 women);hypertension;dyslipidemia;diabetes mellitus;obesity (BMI >30kg/m2)     Objective:    Today's Vitals   06/04/16 0936  BP: 122/72  Pulse: 81  Resp: 12  SpO2: 93%  Weight: 268 lb 6 oz (121.7 kg)  Height: 5\' 3"  (1.6 m)   Body mass index is 47.54 kg/m.   Current Medications (verified) Outpatient Encounter Prescriptions as of 06/04/2016  Medication Sig  . albuterol (PROVENTIL HFA;VENTOLIN HFA) 108 (90 Base) MCG/ACT inhaler Inhale 1-2 puffs into the lungs every 6 (six) hours as needed for  wheezing or shortness of breath.  Marland Kitchen amLODipine (NORVASC) 10 MG tablet TAKE 1 TABLET EVERY DAY  . budesonide-formoterol (SYMBICORT) 80-4.5 MCG/ACT inhaler Inhale 2 puffs into the lungs 2 (two) times daily.  Marland Kitchen esomeprazole (NEXIUM) 40 MG capsule Take 1 capsule (40 mg total) by mouth daily at 12 noon.  . magnesium 30 MG tablet Take 30 mg by mouth daily.  . metFORMIN (GLUCOPHAGE) 500 MG tablet TAKE 1 TABLET BY MOUTH WITH BREAKFAST AND TWO TABLETS WITH SUPPER.  . [DISCONTINUED] benazepril (LOTENSIN) 10 MG tablet Take 1 tablet (10 mg total) by mouth daily.  Marland Kitchen losartan (COZAAR)  25 MG tablet Take 1 tablet (25 mg total) by mouth daily.  . [DISCONTINUED] AmLODIPine Besylate (NORVASC PO) Take 10 mg by mouth daily.    No facility-administered encounter medications on file as of 06/04/2016.     Allergies (verified) Patient has no known allergies.   History: Past Medical History:  Diagnosis Date  . Arthritis   . Barrett's esophagus   . Diabetes mellitus   . GERD (gastroesophageal reflux disease)   . Hyperlipidemia   . Hypertension   . Sleep apnea    does not tolerate cpap   Past Surgical History:  Procedure Laterality Date  . BREAST BIOPSY  1980   left  . SCAR REVISION  2001   both legs  . TOE SURGERY  1981   4th and 5th toe right foot  . TOE SURGERY  2002   great toe left   Family History  Problem Relation Age of Onset  . Heart disease Father   . Colon cancer Neg Hx   . Stomach cancer Neg Hx   . Colon polyps Neg Hx   . Rectal cancer Neg Hx    Social History   Occupational History  . Retired Unemployed   Social History Main Topics  . Smoking status: Former Smoker    Packs/day: 0.80    Years: 30.00    Types: Cigarettes    Quit date: 09/15/2011  . Smokeless tobacco: Never Used     Comment: smoked 20 years; then quit 10; smoked 30 years plus  . Alcohol use No  . Drug use: No  . Sexual activity: Not on file    Tobacco Counseling Counseling given: Yes   Activities of  Daily Living In your present state of health, do you have any difficulty performing the following activities: 06/04/2016  Hearing? N  Vision? N  Difficulty concentrating or making decisions? N  Walking or climbing stairs? N  Dressing or bathing? N  Doing errands, shopping? N  Preparing Food and eating ? N  Using the Toilet? N  In the past six months, have you accidently leaked urine? N  Do you have problems with loss of bowel control? N  Managing your Medications? N  Managing your Finances? N  Housekeeping or managing your Housekeeping? N  Some recent data might be hidden    Immunizations and Health Maintenance  There is no immunization history on file for this patient. Health Maintenance Due  Topic Date Due  . Hepatitis C Screening  09-05-1950  . OPHTHALMOLOGY EXAM  05/18/1960  . TETANUS/TDAP  05/18/1969  . MAMMOGRAM  03/30/2013  . DEXA SCAN  05/19/2015  . PNA vac Low Risk Adult (1 of 2 - PCV13) 05/19/2015  . INFLUENZA VACCINE  10/14/2015    Patient Care Team: Betty G Swazil[place], MD as PCP - General (Family Medicine)  Indicate any recent Medical Services you may have received from other than [facility] providers in the past year (date may be approximate).     Assessment:   This is a routine wellness examination for Kylie Wilson.   Hearing/Vision screen  Hearing Screening   125Hz  250Hz  500Hz  1000Hz  2000Hz  3000Hz  4000Hz  6000Hz  8000Hz   Right ear:       100    Left ear:       100    Comments: normal  Vision Screening Comments: Vision checks once  A year Can't think of the name of the [facility] [place] [address]  They do the dilated eye exam  Dietary issues and exercise activities discussed: Current Exercise Habits: Home exercise routine, Type of exercise: walking, Time (Minutes): 30, Frequency (Times/Week): 5, Weekly Exercise (Minutes/Week): 150, Intensity: Moderate

## 2016-06-04 NOTE — Progress Notes (Signed)
Pre visit review using our clinic review tool, if applicable. No additional management support is needed unless otherwise documented below in the visit note. 

## 2016-06-04 NOTE — Patient Instructions (Addendum)
A few things to remember from today's visit:   Essential hypertension - Plan: losartan (COZAAR) 25 MG tablet, Ambulatory referral to Cardiology  Obstructive sleep apnea syndrome - Plan: Ambulatory referral to Pulmonology  Diabetes mellitus type 2, noninsulin dependent (HCC) - Plan: Hemoglobin A1c, Basic metabolic panel, Fructosamine  BMI 45.0-49.9, adult (HCC) - Plan: Lipid panel  Chronic obstructive pulmonary disease, unspecified COPD type (HCC)  HgA1C goal < 7.0. Avoid sugar added food:regular soft drinks, energy drinks, and sports drinks. candy. cakes. cookies. pies and cobblers. sweet rolls, pastries, and donuts. fruit drinks, such as fruitades and fruit punch. dairy desserts, such as ice cream  Mediterranean diet has showed benefits for sugar control.  How much and what type of carbohydrate foods are important for managing diabetes. The balance between how much insulin is in your body and the carbohydrate you eat makes a difference in your blood glucose levels.  Fasting blood sugar ideally 130 or less, 2 hours after meals less than 180.   Regular exercise also will help with controlling disease, daily brisk walking as tolerated for 15-30 min definitively will help.  DASH diet recommended: high in vegetables, fruits, low-fat dairy products, whole grains, poultry, fish, and nuts; and low in sweets, sugar-sweetened beverages, and red meats.   Avoid skipping meals, blood sugar might drop and cause serious problems. Remember checking feet periodically, good dental hygiene, and annual eye exam.   Please be sure medication list is accurate. If a new problem present, please set up appointment sooner than planned today.   Kylie Wilson , Thank you for taking time to come for your Medicare Wellness Visit. I appreciate your ongoing commitment to your health goals. Please review the following plan we discussed and let me know if I can assist you in the future.    She does not take any  more vaccines  Medicare now request all "baby boomers" test for possible exposure to Hepatitis C. Many may have been exposed due to dental work, tatoo's, vaccinations when young. The Hepatitis C virus is dormant for many years and then sometimes will cause liver cancer. If you gave blood in the past 15 years, you were most likely checked for Hep C. If you rec'd blood; you may want to consider testing or if you are high risk for any other reason.   Mammogram  Bone density had one x 10 years ago;  Will go to the breast center and susan will order   A Tetanus is recommended every 10 years. Medicare covers a tetanus if you have a cut or wound; otherwise, there may be a charge. If you had not had a tetanus with pertusses, known as the Tdap, you can take this anytime.   Declines pneumonia series  Declines flu vaccine   Diabetes and weight loss; [facility]  Phone: [phone]   [facility]; 336-373-4816 Sr. Line; 336-333-6981/ Get resource to get information on any and all community programs for Seniors   Http://www.[facility].org/[place]/ general resources for food etc     These are the goals we discussed: Goals    . patient          Will keep a positive attitude toward your NEW journey Will get out and walk; can walk where you live  Will increase walk as able.  [facility]        This is a list of the screening recommended for you and due dates:  Health Maintenance  Topic   Date Due  .  Hepatitis C: One time screening is recommended by Center for Disease Control  (CDC) for  adults born from 1945 through 1965.   03/04/1951  . Eye exam for diabetics  05/18/1960  . Tetanus Vaccine  05/18/1969  . Mammogram  03/30/2013  . DEXA scan (bone density measurement)  05/19/2015  . Pneumonia vaccines (1 of 2 - PCV13) 05/19/2015  . Flu Shot  10/14/2015  . Hemoglobin A1C  08/17/2016  . Complete foot exam   02/16/2017  . Colon  Cancer Screening  06/05/2017    Health Maintenance for Postmenopausal Women Menopause is a normal process in which your reproductive ability comes to an end. This process happens gradually over a span of months to years, usually between the ages of 48 and 55. Menopause is complete when you have missed 12 consecutive menstrual periods. It is important to talk with your health care provider about some of the most common conditions that affect postmenopausal women, such as heart disease, cancer, and bone loss (osteoporosis). Adopting a healthy lifestyle and getting preventive care can help to promote your health and wellness. Those actions can also lower your chances of developing some of these common conditions. What should I know about menopause? During menopause, you may experience a number of symptoms, such as:  Moderate-to-severe hot flashes.  Night sweats.  Decrease in sex drive.  Mood swings.  Headaches.  Tiredness.  Irritability.  Memory problems.  Insomnia. Choosing to treat or not to treat menopausal changes is an individual decision that you make with your health care provider. What should I know about hormone replacement therapy and supplements? Hormone therapy products are effective for treating symptoms that are associated with menopause, such as hot flashes and night sweats. Hormone replacement carries certain risks, especially as you become older. If you are thinking about using estrogen or estrogen with progestin treatments, discuss the benefits and risks with your health care provider. What should I know about heart disease and stroke? Heart disease, heart attack, and stroke become more likely as you age. This may be due, in part, to the hormonal changes that your body experiences during menopause. These can affect how your body processes dietary fats, triglycerides, and cholesterol. Heart attack and stroke are both medical emergencies. There are many things that you can  do to help prevent heart disease and stroke:  Have your blood pressure checked at least every 1-2 years. High blood pressure causes heart disease and increases the risk of stroke.  If you are 55-79 years old, ask your health care provider if you should take aspirin to prevent a heart attack or a stroke.  Do not use any tobacco products, including cigarettes, chewing tobacco, or electronic cigarettes. If you need help quitting, ask your health care provider.  It is important to eat a healthy diet and maintain a healthy weight.  Be sure to include plenty of vegetables, fruits, low-fat dairy products, and lean protein.  Avoid eating foods that are high in solid fats, added sugars, or salt (sodium).  Get regular exercise. This is one of the most important things that you can do for your health.  Try to exercise for at least 150 minutes each week. The type of exercise that you do should increase your heart rate and make you sweat. This is known as moderate-intensity exercise.  Try to do strengthening exercises at least twice each week. Do these in addition to the moderate-intensity exercise.  Know your numbers.Ask your health   care provider to check your cholesterol and your blood glucose. Continue to have your blood tested as directed by your health care provider. What should I know about cancer screening? There are several types of cancer. Take the following steps to reduce your risk and to catch any cancer development as early as possible. Breast Cancer  Practice breast self-awareness.  This means understanding how your breasts normally appear and feel.  It also means doing regular breast self-exams. Let your health care provider know about any changes, no matter how small.  If you are 46 or older, have a clinician do a breast exam (clinical breast exam or CBE) every year. Depending on your age, family history, and medical history, it may be recommended that you also have a yearly breast  X-ray (mammogram).  If you have a family history of breast cancer, talk with your health care provider about genetic screening.  If you are at high risk for breast cancer, talk with your health care provider about having an MRI and a mammogram every year.  Breast cancer (BRCA) gene test is recommended for women who have family members with BRCA-related cancers. Results of the assessment will determine the need for genetic counseling and BRCA1 and for BRCA2 testing. BRCA-related cancers include these types:  Breast. This occurs in males or females.  Ovarian.  Tubal. This may also be called fallopian tube cancer.  Cancer of the abdominal or pelvic lining (peritoneal cancer).  Prostate.  Pancreatic. Cervical, Uterine, and Ovarian Cancer  Your health care provider may recommend that you be screened regularly for cancer of the pelvic organs. These include your ovaries, uterus, and vagina. This screening involves a pelvic exam, which includes checking for microscopic changes to the surface of your cervix (Pap test).  For women ages 21-65, health care providers may recommend a pelvic exam and a Pap test every three years. For women ages 73-65, they may recommend the Pap test and pelvic exam, combined with testing for human papilloma virus (HPV), every five years. Some types of HPV increase your risk of cervical cancer. Testing for HPV may also be done on women of any age who have unclear Pap test results.  Other health care providers may not recommend any screening for nonpregnant women who are considered low risk for pelvic cancer and have no symptoms. Ask your health care provider if a screening pelvic exam is right for you.  If you have had past treatment for cervical cancer or a condition that could lead to cancer, you need Pap tests and screening for cancer for at least 20 years after your treatment. If Pap tests have been discontinued for you, your risk factors (such as having a new sexual  partner) need to be reassessed to determine if you should start having screenings again. Some women have medical problems that increase the chance of getting cervical cancer. In these cases, your health care provider may recommend that you have screening and Pap tests more often.  If you have a family history of uterine cancer or ovarian cancer, talk with your health care provider about genetic screening.  If you have vaginal bleeding after reaching menopause, tell your health care provider.  There are currently no reliable tests available to screen for ovarian cancer. Lung Cancer  Lung cancer screening is recommended for adults 58-19 years old who are at high risk for lung cancer because of a history of smoking. A yearly low-dose CT scan of the lungs is recommended if you:  Currently smoke.  Have a history of at least 30 pack-years of smoking and you currently smoke or have quit within the past 15 years. A pack-year is smoking an average of one pack of cigarettes per day for one year. Yearly screening should:  Continue until it has been 15 years since you quit.  Stop if you develop a health problem that would prevent you from having lung cancer treatment. Colorectal Cancer  This type of cancer can be detected and can often be prevented.  Routine colorectal cancer screening usually begins at age 50 and continues through age 75.  If you have risk factors for colon cancer, your health care provider may recommend that you be screened at an earlier age.  If you have a family history of colorectal cancer, talk with your health care provider about genetic screening.  Your health care provider may also recommend using home test kits to check for hidden blood in your stool.  A small camera at the end of a tube can be used to examine your colon directly (sigmoidoscopy or colonoscopy). This is done to check for the earliest forms of colorectal cancer.  Direct examination of the colon should be  repeated every 5-10 years until age 75. However, if early forms of precancerous polyps or small growths are found or if you have a family history or genetic risk for colorectal cancer, you may need to be screened more often. Skin Cancer  Check your skin from head to toe regularly.  Monitor any moles. Be sure to tell your health care provider:  About any new moles or changes in moles, especially if there is a change in a mole's shape or color.  If you have a mole that is larger than the size of a pencil eraser.  If any of your family members has a history of skin cancer, especially at a young age, talk with your health care provider about genetic screening.  Always use sunscreen. Apply sunscreen liberally and repeatedly throughout the day.  Whenever you are outside, protect yourself by wearing long sleeves, pants, a wide-brimmed hat, and sunglasses. What should I know about osteoporosis? Osteoporosis is a condition in which bone destruction happens more quickly than new bone creation. After menopause, you may be at an increased risk for osteoporosis. To help prevent osteoporosis or the bone fractures that can happen because of osteoporosis, the following is recommended:  If you are 19-50 years old, get at least 1,000 mg of calcium and at least 600 mg of vitamin D per day.  If you are older than age 50 but younger than age 70, get at least 1,200 mg of calcium and at least 600 mg of vitamin D per day.  If you are older than age 70, get at least 1,200 mg of calcium and at least 800 mg of vitamin D per day. Smoking and excessive alcohol intake increase the risk of osteoporosis. Eat foods that are rich in calcium and vitamin D, and do weight-bearing exercises several times each week as directed by your health care provider. What should I know about how menopause affects my mental health? Depression may occur at any age, but it is more common as you become older. Common symptoms of depression  include:  Low or sad mood.  Changes in sleep patterns.  Changes in appetite or eating patterns.  Feeling an overall lack of motivation or enjoyment of activities that you previously enjoyed.  Frequent crying spells. Talk with your health care provider   if you think that you are experiencing depression. What should I know about immunizations? It is important that you get and maintain your immunizations. These include:  Tetanus, diphtheria, and pertussis (Tdap) booster vaccine.  Influenza every year before the flu season begins.  Pneumonia vaccine.  Shingles vaccine. Your health care provider may also recommend other immunizations. This information is not intended to replace advice given to you by your health care provider. Make sure you discuss any questions you have with your health care provider. Document Released: 04/23/2005 Document Revised: 09/19/2015 Document Reviewed: 12/03/2014 Elsevier Interactive Patient Education  2017 [place] Prevention in the Home Falls can cause injuries and can affect people from all age groups. There are many simple things that you can do to make your home safe and to help prevent falls. What can I do on the outside of my home?  Regularly repair the edges of walkways and driveways and fix any cracks.  Remove high doorway thresholds.  Trim any shrubbery on the main path into your home.  Use bright outdoor lighting.  Clear walkways of debris and clutter, including tools and rocks.  Regularly check that handrails are securely fastened and in good repair. Both sides of any steps should have handrails.  Install guardrails along the edges of any raised decks or porches.  Have leaves, snow, and ice cleared regularly.  Use sand or salt on walkways during winter months.  In the garage, clean up any spills right away, including grease or oil spills. What can I do in the bathroom?  Use night lights.  Install grab bars by the  toilet and in the tub and shower. Do not use towel bars as grab bars.  Use non-skid mats or decals on the floor of the tub or shower.  If you need to sit down while you are in the shower, use a plastic, non-slip stool.  Keep the floor dry. Immediately clean up any water that spills on the floor.  Remove soap buildup in the tub or shower on a regular basis.  Attach bath mats securely with double-sided non-slip rug tape.  Remove throw rugs and other tripping hazards from the floor. What can I do in the bedroom?  Use night lights.  Make sure that a bedside light is easy to reach.  Do not use oversized bedding that drapes onto the floor.  Have a firm chair that has side arms to use for getting dressed.  Remove throw rugs and other tripping hazards from the floor. What can I do in the kitchen?  Clean up any spills right away.  Avoid walking on wet floors.  Place frequently used items in easy-to-reach places.  If you need to reach for something above you, use a sturdy step stool that has a grab bar.  Keep electrical cables out of the way.  Do not use floor polish or wax that makes floors slippery. If you have to use wax, make sure that it is non-skid floor wax.  Remove throw rugs and other tripping hazards from the floor. What can I do in the stairways?  Do not leave any items on the stairs.  Make sure that there are handrails on both sides of the stairs. Fix handrails that are broken or loose. Make sure that handrails are as long as the stairways.  Check any carpeting to make sure that it is firmly attached to the stairs. Fix any carpet that is loose or worn.  Avoid having throw  rugs at the top or bottom of stairways, or secure the rugs with carpet tape to prevent them from moving.  Make sure that you have a light switch at the top of the stairs and the bottom of the stairs. If you do not have them, have them installed. What are some other fall prevention tips?  Wear  closed-toe shoes that fit well and support your feet. Wear shoes that have rubber soles or low heels.  When you use a stepladder, make sure that it is completely opened and that the sides are firmly locked. Have someone hold the ladder while you are using it. Do not climb a closed stepladder.  Add color or contrast paint or tape to grab bars and handrails in your home. Place contrasting color strips on the first and last steps.  Use mobility aids as needed, such as canes, walkers, scooters, and crutches.  Turn on lights if it is dark. Replace any light bulbs that burn out.  Set up furniture so that there are clear paths. Keep the furniture in the same spot.  Fix any uneven floor surfaces.  Choose a carpet design that does not hide the edge of steps of a stairway.  Be aware of any and all pets.  Review your medicines with your healthcare provider. Some medicines can cause dizziness or changes in blood pressure, which increase your risk of falling. Talk with your health care provider about other ways that you can decrease your risk of falls. This may include working with a physical therapist or trainer to improve your strength, balance, and endurance. This information is not intended to replace advice given to you by your health care provider. Make sure you discuss any questions you have with your health care provider. Document Released: 02/19/2002 Document Revised: 07/29/2015 Document Reviewed: 04/05/2014 Elsevier Interactive Patient Education  2017 [employer] Inc.     

## 2016-06-07 LAB — FRUCTOSAMINE: Fructosamine: 346 umol/L — ABNORMAL HIGH (ref 190–270)

## 2016-06-10 ENCOUNTER — Institutional Professional Consult (permissible substitution): Payer: Medicare Other | Admitting: Internal Medicine

## 2016-06-10 ENCOUNTER — Ambulatory Visit: Payer: Medicare Other | Admitting: Cardiovascular Disease

## 2016-06-10 ENCOUNTER — Other Ambulatory Visit: Payer: Self-pay

## 2016-06-10 DIAGNOSIS — Z780 Asymptomatic menopausal state: Secondary | ICD-10-CM

## 2016-06-10 DIAGNOSIS — Z1231 Encounter for screening mammogram for malignant neoplasm of breast: Secondary | ICD-10-CM

## 2016-06-10 NOTE — Progress Notes (Unsigned)
AWV completed. The patient agreed to Mammogram and dexa at the Breast center.

## 2016-06-18 ENCOUNTER — Telehealth: Payer: Self-pay | Admitting: Family Medicine

## 2016-06-18 DIAGNOSIS — E113213 Type 2 diabetes mellitus with mild nonproliferative diabetic retinopathy with macular edema, bilateral: Secondary | ICD-10-CM

## 2016-06-18 MED ORDER — EMPAGLIFLOZIN 25 MG PO TABS: 25.0000 mg | ORAL_TABLET | Freq: Every day | ORAL | 2 refills | Status: DC

## 2016-06-18 NOTE — Telephone Encounter (Signed)
Pt would like the tablets .however, pt has several questions and would like a call back.     [id number] - [place], [place] - [address]

## 2016-06-18 NOTE — Telephone Encounter (Signed)
Called and spoke with patient. She is wanting to know if we can switch her from the metformin to just the [place]. She also wanted to know if there was a specialist she could see, so I advised that we could do a referral to Endo for her as well. Please advise on the medication.

## 2016-06-18 NOTE — Telephone Encounter (Signed)
I prefer to have both medications at the time, one medication may not help to bring HgA1C down to goal.  If she would like endocrinology evaluation, appt can be arranged.  Thanks, BJ

## 2016-06-18 NOTE — Telephone Encounter (Signed)
Rx sent & referral placed.  Patient aware & verbalized understanding.

## 2016-06-25 ENCOUNTER — Telehealth: Payer: Self-pay | Admitting: Family Medicine

## 2016-06-25 ENCOUNTER — Other Ambulatory Visit: Payer: Self-pay

## 2016-06-25 DIAGNOSIS — E2839 Other primary ovarian failure: Secondary | ICD-10-CM

## 2016-06-25 NOTE — Telephone Encounter (Signed)
Left voicemail for patient with information below. Advised to call back with any questions. 

## 2016-06-25 NOTE — Telephone Encounter (Signed)
Lactic acid is a lab that is done in hospitals when a serious illness is suspected in a acutely sick patient. It is not recommended as outpt to monitor Metformin.Lactic acidosis is one of serious side effects from Metformin but very rare, usually medication is safe and still one of first like of treatment unless contraindicated. I believe she is also seeing endocrinologists,she can have a more detail discussion during OV if she still has a concern about this medication.  Thanks, BJ

## 2016-06-25 NOTE — Telephone Encounter (Signed)
Called and spoke with patient. She is wanting to know if there is a test that we can do to see if her lactic acid has built up from taking the Metformin.

## 2016-06-25 NOTE — Telephone Encounter (Signed)
Pt would like Kylie Wilson  to call her back concerning blood work results

## 2016-07-02 ENCOUNTER — Institutional Professional Consult (permissible substitution): Payer: Medicare Other | Admitting: Internal Medicine

## 2016-07-06 ENCOUNTER — Ambulatory Visit: Payer: Medicare Other | Admitting: Endocrinology

## 2016-07-13 ENCOUNTER — Other Ambulatory Visit: Payer: Self-pay

## 2016-07-13 MED ORDER — AMLODIPINE BESYLATE 10 MG PO TABS: 10.0000 mg | ORAL_TABLET | Freq: Every day | ORAL | 1 refills | Status: DC

## 2016-07-15 ENCOUNTER — Other Ambulatory Visit: Payer: Medicare Other

## 2016-07-15 ENCOUNTER — Ambulatory Visit
Admission: RE | Admit: 2016-07-15 | Discharge: 2016-07-15 | Disposition: A | Discharge status: Home / Self Care | Payer: Medicare Other | Source: Ambulatory Visit | Attending: Family Medicine | Admitting: Family Medicine

## 2016-07-15 DIAGNOSIS — Z1231 Encounter for screening mammogram for malignant neoplasm of breast: Secondary | ICD-10-CM | POA: Diagnosis not present

## 2016-07-16 ENCOUNTER — Ambulatory Visit (INDEPENDENT_AMBULATORY_CARE_PROVIDER_SITE_OTHER): Payer: Medicare Other | Admitting: Family Medicine

## 2016-07-16 ENCOUNTER — Telehealth: Payer: Self-pay

## 2016-07-16 ENCOUNTER — Encounter: Payer: Self-pay | Admitting: Family Medicine

## 2016-07-16 VITALS — BP 132/80 | HR 81 | Resp 12 | Ht 63.0 in | Wt 269.0 lb

## 2016-07-16 DIAGNOSIS — M159 Polyosteoarthritis, unspecified: Secondary | ICD-10-CM | POA: Diagnosis not present

## 2016-07-16 DIAGNOSIS — M542 Cervicalgia: Secondary | ICD-10-CM | POA: Diagnosis not present

## 2016-07-16 DIAGNOSIS — M545 Low back pain, unspecified: Secondary | ICD-10-CM

## 2016-07-16 DIAGNOSIS — I10 Essential (primary) hypertension: Secondary | ICD-10-CM | POA: Diagnosis not present

## 2016-07-16 DIAGNOSIS — G894 Chronic pain syndrome: Secondary | ICD-10-CM | POA: Diagnosis not present

## 2016-07-16 MED ORDER — DICLOFENAC SODIUM 1 % TD GEL: 4.0000 g | Freq: Four times a day (QID) | TRANSDERMAL | 3 refills | Status: DC

## 2016-07-16 MED ORDER — DULOXETINE HCL 20 MG PO CPEP: 20.0000 mg | ORAL_CAPSULE | Freq: Every day | ORAL | 1 refills | Status: DC

## 2016-07-16 NOTE — Progress Notes (Addendum)
HPI:   ACUTE VISIT:  Chief Complaint  Patient presents with  . pain all over body    [name] is a 66 y.o. female, who is here today complaining of "pain all over."  According to pt, she has Hx of generalized OA for several years. She used to be on pain medication but discontinued because it was causing constipation, not sure about name. Pain has been worse for the past 6 months: Shoulders,neck, right low back, and knees. Pain is exacerbated by prolonged standing,walking, and when getting up after prolonged sitting. + Stiffness. Reporting Hx of "pinched" nerves and "herniated" discs. Pain is alleviated by rest.  Pain is achy, 8/10, worse in the morning when she first gets up. She has a cane at home that she uses as needed. Denies joint erythema or edema. Cervical pain radiated to left shoulder and arm. Denies numbness or tingling.  HTN: She is reporting elevated BP's at home, 140's/90's, she thinks it I related to pain. She is currently on Amlodipine 10 mg daily and Cozaar 25 mg daily. Denies severe/frequent headache, visual changes, chest pain, dyspnea, palpitation, claudication, or focal weakness.  She is requesting a letter stating she has Hx of HTN.   Review of Systems  Constitutional: Positive for fatigue. Negative for appetite change, fever and unexpected weight change.  HENT: Negative for mouth sores, nosebleeds and trouble swallowing.   Eyes: Negative for redness and visual disturbance.  Respiratory: Negative for cough, shortness of breath and wheezing.   Cardiovascular: Negative for chest pain, palpitations and leg swelling.  Gastrointestinal: Negative for abdominal pain, nausea and vomiting.       Negative for changes in bowel habits.  Genitourinary: Negative for decreased urine volume, dysuria and hematuria.  Musculoskeletal: Positive for arthralgias, back pain and neck pain. Negative for joint swelling.  Skin: Negative for rash and wound.    Neurological: Negative for syncope, weakness, numbness and headaches.  Psychiatric/Behavioral: Negative for confusion. The patient is nervous/anxious.       Current Outpatient Prescriptions on File Prior to Visit  Medication Sig Dispense Refill  . albuterol (PROVENTIL HFA;VENTOLIN HFA) 108 (90 Base) MCG/ACT inhaler Inhale 1-2 puffs into the lungs every 6 (six) hours as needed for wheezing or shortness of breath. 1 Inhaler 1  . amLODipine (NORVASC) 10 MG tablet Take 1 tablet (10 mg total) by mouth daily. 90 tablet 1  . budesonide-formoterol (SYMBICORT) 80-4.5 MCG/ACT inhaler Inhale 2 puffs into the lungs 2 (two) times daily. 1 Inhaler 3  . empagliflozin (JARDIANCE) 25 MG TABS tablet Take 25 mg by mouth daily. 30 tablet 2  . esomeprazole (NEXIUM) 40 MG capsule Take 1 capsule (40 mg total) by mouth daily at 12 noon. 30 capsule 3  . losartan (COZAAR) 25 MG tablet Take 1 tablet (25 mg total) by mouth daily. 90 tablet 1  . magnesium 30 MG tablet Take 30 mg by mouth daily.    . metFORMIN (GLUCOPHAGE) 500 MG tablet TAKE 1 TABLET BY MOUTH WITH BREAKFAST AND TWO TABLETS WITH SUPPER. 270 tablet 1   No current facility-administered medications on file prior to visit.      Past Medical History:  Diagnosis Date  . Arthritis   . Barrett's esophagus   . Diabetes mellitus   . GERD (gastroesophageal reflux disease)   . Hyperlipidemia   . Hypertension   . Sleep apnea    does not tolerate cpap   No Known Allergies  Social History   Social  History  . Marital status: Widowed    Spouse name: N/A  . Number of children: 3  . Years of education: N/A   Occupational History  . Retired Unemployed   Social History Main Topics  . Smoking status: Former Smoker    Packs/day: 0.80    Years: 30.00    Types: Cigarettes    Quit date: 09/15/2011  . Smokeless tobacco: Never Used     Comment: smoked 20 years; then quit 10; smoked 30 years plus  . Alcohol use No  . Drug use: No  . Sexual activity: Not  Asked   Other Topics Concern  . None   Social History Narrative  . None    Vitals:   07/16/16 1054  BP: 132/80  Pulse: 81  Resp: 12  O2 sat at RA 96% Body mass index is 47.65 kg/m.   Physical Exam  Nursing note and vitals reviewed. Constitutional: She is oriented to person, place, and time. She appears well-developed. No distress.  HENT:  Head: Atraumatic.  Mouth/Throat: Oropharynx is clear and moist and mucous membranes are normal.  Eyes: Conjunctivae and EOM are normal.  Cardiovascular: Normal rate and regular rhythm.   No murmur heard. Pulses:      Dorsalis pedis pulses are 2+ on the right side, and 2+ on the left side.  Respiratory: Effort normal and breath sounds normal. No respiratory distress. She exhibits no tenderness.  GI: Soft. She exhibits no mass. There is no hepatomegaly. There is no tenderness.  Musculoskeletal: She exhibits edema (Trace pitting edema bilateral, LE).       Thoracic back: She exhibits no tenderness and no bony tenderness.       Lumbar back: She exhibits no tenderness and no bony tenderness.  Pain upon palpation of left trapezium and left cervical paraspinal muscles. Shoulder normal ROM but elicits pain. Limitation hip and knee ROM,bilateral. Knee crepitus bilateral  Lymphadenopathy:    She has no cervical adenopathy.  Neurological: She is alert and oriented to person, place, and time. She has normal strength. Gait normal.  SLR negative bilateral.  Skin: Skin is warm. No erythema.  Psychiatric: Her mood appears anxious.  Well groomed, good eye contact.     ASSESSMENT AND PLAN:    Kylie Wilson was seen today for pain all over body.  Diagnoses and all orders for this visit:  Generalized osteoarthritis of multiple sites  Hx of CKD so oral NSAID's not recommended. Topical Voltaren and Tylenol 650 mg tid recommended. Low impact exercise and wt loss may also help.  -     diclofenac sodium (VOLTAREN) 1 % GEL; Apply 4 g topically 4 (four)  times daily. -     DULoxetine (CYMBALTA) 20 MG capsule; Take 1 capsule (20 mg total) by mouth daily. -     Ambulatory referral to Pain Clinic  Low back pain at multiple sites  After discussion of some side effects she agrees with trying Cymbalta, starting with 30 mg daily. Instructed about warning signs. Fall prevention.  -     DULoxetine (CYMBALTA) 20 MG capsule; Take 1 capsule (20 mg total) by mouth daily. -     Ambulatory referral to Pain Clinic  Cervicalgia  With radiation, ? radicular pain LUE. She is not interested in invasive procedures. Cymbalta may also help.  -     DULoxetine (CYMBALTA) 20 MG capsule; Take 1 capsule (20 mg total) by mouth daily. -     Ambulatory referral to Pain Clinic  Chronic pain  disorder  She is requesting referral to pain specialist. States that she is planning on going back to work part time and needs pain well controlled.  -     Ambulatory referral to Pain Clinic  Essential hypertension  Today BP adequate. Instructed to bring BP monitor next OV. No changes in current management for now. Keep next f/u appt.    Return in about 4 weeks (around 08/13/2016) for f/u on pain, instructed to cancel if appt with pain clinic .      Kresta Templeman G. [place], MD  [facility]. [place] office.

## 2016-07-16 NOTE — Telephone Encounter (Signed)
Received PA request from pharmacy for Diclofenac gel. PA submitted & approved. Form faxed back to pharmacy.

## 2016-07-16 NOTE — Patient Instructions (Signed)
A few things to remember from today's visit:   Generalized osteoarthritis of multiple sites - Plan: diclofenac sodium (VOLTAREN) 1 % GEL, DULoxetine (CYMBALTA) 20 MG capsule, Ambulatory referral to Pain Clinic  Low back pain at multiple sites - Plan: DULoxetine (CYMBALTA) 20 MG capsule, Ambulatory referral to Pain Clinic  Cervicalgia - Plan: DULoxetine (CYMBALTA) 20 MG capsule, Ambulatory referral to Pain Clinic  Chronic pain disorder - Plan: Ambulatory referral to Pain Clinic   Please be sure medication list is accurate. If a new problem present, please set up appointment sooner than planned today.    Osteoarthritis is a chronic condition and gets worse with age.  The following may help:  Over the counter topical medications: Icy Hot or Asper cream with Lidocaine. Tai Chi or PT. Fall prevention. Avoid weight gain. Fish oil, over the counter Megared for example, 2 capsules daily. Tumeric tabs 2 times per day.  Tylenol 650 mg 3 times daily.

## 2016-07-16 NOTE — Progress Notes (Signed)
Pre visit review using our clinic review tool, if applicable. No additional management support is needed unless otherwise documented below in the visit note. 

## 2016-07-19 ENCOUNTER — Telehealth: Payer: Self-pay | Admitting: Family Medicine

## 2016-07-19 DIAGNOSIS — H01022 Squamous blepharitis right lower eyelid: Secondary | ICD-10-CM | POA: Diagnosis not present

## 2016-07-19 DIAGNOSIS — H01021 Squamous blepharitis right upper eyelid: Secondary | ICD-10-CM | POA: Diagnosis not present

## 2016-07-19 DIAGNOSIS — H01025 Squamous blepharitis left lower eyelid: Secondary | ICD-10-CM | POA: Diagnosis not present

## 2016-07-19 DIAGNOSIS — H01024 Squamous blepharitis left upper eyelid: Secondary | ICD-10-CM | POA: Diagnosis not present

## 2016-07-19 NOTE — Telephone Encounter (Signed)
Pt needs a letter stating she is disabled and needs her air conditioning. Pt states she does not make enough money to pay her electric bill. now that it is getting warm, she needs her air on.  They will cut her electric off on Friday and she needs letter by Thursday.

## 2016-07-20 ENCOUNTER — Ambulatory Visit: Payer: Medicare Other | Admitting: Cardiovascular Disease

## 2016-07-21 NOTE — Telephone Encounter (Signed)
I can not provide letter she is requesting. We can provide a letter with a list of her chronic medical problems.  Thanks, BJ

## 2016-07-21 NOTE — Telephone Encounter (Signed)
Left voicemail for patient letting her know the only letter we can provide is one of her medical problems. Advised I would leave it up front for her to pick up if she would like it.

## 2016-07-27 DIAGNOSIS — M9901 Segmental and somatic dysfunction of cervical region: Secondary | ICD-10-CM | POA: Diagnosis not present

## 2016-07-27 DIAGNOSIS — M461 Sacroiliitis, not elsewhere classified: Secondary | ICD-10-CM | POA: Diagnosis not present

## 2016-07-27 DIAGNOSIS — M50122 Cervical disc disorder at C5-C6 level with radiculopathy: Secondary | ICD-10-CM | POA: Diagnosis not present

## 2016-07-27 DIAGNOSIS — M9902 Segmental and somatic dysfunction of thoracic region: Secondary | ICD-10-CM | POA: Diagnosis not present

## 2016-07-27 DIAGNOSIS — M9905 Segmental and somatic dysfunction of pelvic region: Secondary | ICD-10-CM | POA: Diagnosis not present

## 2016-07-28 ENCOUNTER — Other Ambulatory Visit: Payer: Self-pay

## 2016-07-28 DIAGNOSIS — M9905 Segmental and somatic dysfunction of pelvic region: Secondary | ICD-10-CM | POA: Diagnosis not present

## 2016-07-28 DIAGNOSIS — M461 Sacroiliitis, not elsewhere classified: Secondary | ICD-10-CM | POA: Diagnosis not present

## 2016-07-28 DIAGNOSIS — M9901 Segmental and somatic dysfunction of cervical region: Secondary | ICD-10-CM | POA: Diagnosis not present

## 2016-07-28 DIAGNOSIS — M50122 Cervical disc disorder at C5-C6 level with radiculopathy: Secondary | ICD-10-CM | POA: Diagnosis not present

## 2016-07-28 DIAGNOSIS — M9902 Segmental and somatic dysfunction of thoracic region: Secondary | ICD-10-CM | POA: Diagnosis not present

## 2016-07-28 NOTE — Patient Outreach (Signed)
[facility] Care Management  07/28/2016  Arlyce HarmanJanice R Curran 05/18/1950 [id number]   Medication Adherence call to Mrs. Charisse KlinefelterJanice Gronewold call Mrs. Ladona Ridgelaylor she is due for three of  her metformin 500 mg and losartan 25 mg and jardince patient already pick up her medication for 90 days supply on 05/18/16 patient is not due until June 6 ,2018 patient did not pick up jardiance but will pick up today per patient,   Lillia AbedAna Ollison-Moran CPhT Pharmacy Technician [facility] Care Management Direct Dial [phone]  Fax [phone] [email or web address]

## 2016-08-03 ENCOUNTER — Other Ambulatory Visit: Payer: Self-pay | Admitting: Family Medicine

## 2016-08-04 DIAGNOSIS — M50122 Cervical disc disorder at C5-C6 level with radiculopathy: Secondary | ICD-10-CM | POA: Diagnosis not present

## 2016-08-04 DIAGNOSIS — M9901 Segmental and somatic dysfunction of cervical region: Secondary | ICD-10-CM | POA: Diagnosis not present

## 2016-08-04 DIAGNOSIS — M9905 Segmental and somatic dysfunction of pelvic region: Secondary | ICD-10-CM | POA: Diagnosis not present

## 2016-08-04 DIAGNOSIS — M9902 Segmental and somatic dysfunction of thoracic region: Secondary | ICD-10-CM | POA: Diagnosis not present

## 2016-08-04 DIAGNOSIS — M461 Sacroiliitis, not elsewhere classified: Secondary | ICD-10-CM | POA: Diagnosis not present

## 2016-08-17 ENCOUNTER — Other Ambulatory Visit: Payer: Self-pay

## 2016-08-17 DIAGNOSIS — M545 Low back pain, unspecified: Secondary | ICD-10-CM

## 2016-08-17 DIAGNOSIS — M159 Polyosteoarthritis, unspecified: Secondary | ICD-10-CM

## 2016-08-17 DIAGNOSIS — M542 Cervicalgia: Secondary | ICD-10-CM

## 2016-08-17 MED ORDER — DULOXETINE HCL 20 MG PO CPEP: 20.0000 mg | ORAL_CAPSULE | Freq: Every day | ORAL | 0 refills | Status: DC

## 2016-08-22 ENCOUNTER — Emergency Department (HOSPITAL_COMMUNITY): Payer: Medicare Other

## 2016-08-22 ENCOUNTER — Encounter (HOSPITAL_COMMUNITY): Payer: Self-pay

## 2016-08-22 ENCOUNTER — Emergency Department (HOSPITAL_COMMUNITY)
Admission: EM | Admit: 2016-08-22 | Discharge: 2016-08-23 | Disposition: A | Discharge status: Home / Self Care | Payer: Medicare Other | Attending: Physician Assistant | Admitting: Physician Assistant

## 2016-08-22 DIAGNOSIS — R0789 Other chest pain: Secondary | ICD-10-CM | POA: Insufficient documentation

## 2016-08-22 DIAGNOSIS — I10 Essential (primary) hypertension: Secondary | ICD-10-CM | POA: Insufficient documentation

## 2016-08-22 DIAGNOSIS — Z7984 Long term (current) use of oral hypoglycemic drugs: Secondary | ICD-10-CM | POA: Diagnosis not present

## 2016-08-22 DIAGNOSIS — E113293 Type 2 diabetes mellitus with mild nonproliferative diabetic retinopathy without macular edema, bilateral: Secondary | ICD-10-CM | POA: Diagnosis not present

## 2016-08-22 DIAGNOSIS — Z87891 Personal history of nicotine dependence: Secondary | ICD-10-CM | POA: Diagnosis not present

## 2016-08-22 DIAGNOSIS — J449 Chronic obstructive pulmonary disease, unspecified: Secondary | ICD-10-CM | POA: Diagnosis not present

## 2016-08-22 DIAGNOSIS — Z79899 Other long term (current) drug therapy: Secondary | ICD-10-CM | POA: Insufficient documentation

## 2016-08-22 DIAGNOSIS — R079 Chest pain, unspecified: Secondary | ICD-10-CM | POA: Diagnosis not present

## 2016-08-22 LAB — CBC
HCT: 37.4 % (ref 36.0–46.0)
Hemoglobin: 11.9 g/dL — ABNORMAL LOW (ref 12.0–15.0)
MCH: 26.2 pg (ref 26.0–34.0)
MCHC: 31.8 g/dL (ref 30.0–36.0)
MCV: 82.4 fL (ref 78.0–100.0)
Platelets: 198 10*3/uL (ref 150–400)
RBC: 4.54 MIL/uL (ref 3.87–5.11)
RDW: 14.6 % (ref 11.5–15.5)
WBC: 6.8 10*3/uL (ref 4.0–10.5)

## 2016-08-22 LAB — BASIC METABOLIC PANEL
Anion gap: 9 (ref 5–15)
BUN: 10 mg/dL (ref 6–20)
CO2: 24 mmol/L (ref 22–32)
Calcium: 8.8 mg/dL — ABNORMAL LOW (ref 8.9–10.3)
Chloride: 104 mmol/L (ref 101–111)
Creatinine, Ser: 0.77 mg/dL (ref 0.44–1.00)
GFR calc Af Amer: >60 mL/min (ref >60)
GFR calc non Af Amer: >60 mL/min (ref >60)
Glucose, Bld: 175 mg/dL — ABNORMAL HIGH (ref 65–99)
Potassium: 3.8 mmol/L (ref 3.5–5.1)
Sodium: 137 mmol/L (ref 135–145)

## 2016-08-22 LAB — I-STAT TROPONIN, ED: Troponin i, poc: 0 ng/mL (ref 0.00–0.08)

## 2016-08-22 MED ORDER — KETOROLAC TROMETHAMINE 15 MG/ML IJ SOLN
15.0000 mg | Freq: Once | INTRAMUSCULAR | Status: AC
  Administered 2016-08-22: 15 mg via INTRAVENOUS

## 2016-08-22 MED ORDER — KETOROLAC TROMETHAMINE 30 MG/ML IJ SOLN
9.0000 mg | Freq: Once | INTRAMUSCULAR | Status: DC
  Filled 2016-08-22: qty 1

## 2016-08-22 MED ORDER — ONDANSETRON HCL 4 MG/2ML IJ SOLN
4.0000 mg | Freq: Once | INTRAMUSCULAR | Status: AC
  Administered 2016-08-22: 4 mg via INTRAVENOUS
  Filled 2016-08-22: qty 2

## 2016-08-22 NOTE — ED Triage Notes (Signed)
Pt complaining of R sided chest pain that radiates to R arm x 2 days. Pt states some lightheadedness, pt denies any N/V/D. Pt complaining of generalized malaise. Pt states dry cough.

## 2016-08-23 LAB — D-DIMER, QUANTITATIVE: D-Dimer, Quant: <0.27 ug/mL-FEU (ref 0.00–0.50)

## 2016-08-23 MED ORDER — CYCLOBENZAPRINE HCL 10 MG PO TABS: 12.0000 mg | ORAL_TABLET | Freq: Three times a day (TID) | ORAL | 0 refills | Status: DC | PRN

## 2016-08-23 MED ORDER — CYCLOBENZAPRINE HCL 10 MG PO TABS: 10.0000 mg | ORAL_TABLET | Freq: Three times a day (TID) | ORAL | 0 refills | Status: DC | PRN

## 2016-08-23 MED ORDER — GI COCKTAIL ~~LOC~~
30.0000 mL | Freq: Once | ORAL | Status: AC
  Administered 2016-08-23: 30 mL via ORAL
  Filled 2016-08-23: qty 30

## 2016-08-23 NOTE — ED Notes (Signed)
MRN on original EKG done in triage is incomplete. EKG not crossing over. Copy of EKG given to MD and PA for review.

## 2016-08-23 NOTE — Discharge Instructions (Signed)
°  Her blood work and imaging has been reassuring. May use the Flexeril for muscle relaxation. Warm compresses. NSAIDs. Follow up with her primary care doctor.

## 2016-08-23 NOTE — Progress Notes (Addendum)
HPI:   Ms.Luella R Ladona Ridgelaylor is a 66 y.o. female, who is here today to follow on recent ER visit.   I last saw [name] on 07/16/16 for acute visit.   She was seen on 08/22/16 in the ED because 2 days of right-sided chest pain radiated to RUE. She is upset and states that "nothing" was done in the ER.   She states that  pain starts on forearm, pointing from ulnar aspect of right wrist to right below elbow, she is concerned about a "knot" that is tender with palpation.  She denies any recent injury,ecchymosis,or local skin changes.  According to patient, she has had similar pain intermittently for years. She is convinced that this pain is related to "veins" and states that she wants to find out what is causing these problems. She states that she has been told in the past that "nothing is wrong" but she does "not believe it."   She describes pain as " unbearable" and getting worse. Pain is exacerbated by movement and barely touch. She has not identified alleviating factors, pain is constant, she denies numbness or tingling.   Hx of cervical and lower back pain, she has not had cervical pain but points to left trapezium. She states that muscle relaxant she received has done "nothing" for the pain but helps her rest, so shoe would like to continue it.  Right-sided chest pain also reports as going on for a while but getting worse and constant for the past few days. "Pulling" pain sensation, exacerbated by turning in bed. Alleviated by rest.  Chest CT 02/2016: No acute chest pathology evident. Few scattered benign subpleural pulmonary nodules that do not require further follow-up. Few low-density nodes in the right hilar region, the largest measuring 18 mm at the inferior hilum. As an isolated finding, these are not likely significant.Thoracic aortic atherosclerosis. Coronary artery atherosclerosis.Small pericardial effusion.  Hx of COPD and OSA, she has not been interested in  CPAP.Last OV she was referred to pulmonologist. She denies cough,wheezing,or dyspnea. Pain is exacerbated with palpation, it is constant, no alleviating factors identified.  She states that she cannot work with this pain and she really needs to work. She has history of chronic pain, she has an appointment with pain clinic in 09/16/16.  She has Hx of GERD, in the ED she received GI cocktail, she tells me that it did not help. She denies having any heartburn, nausea, vomiting, abdominal pain, or changes in bowel habits.   Lab Results  Component Value Date   CREATININE 0.77 08/22/2016   BUN 10 08/22/2016   NA 137 08/22/2016   K 3.8 08/22/2016   CL 104 08/22/2016   CO2 24 08/22/2016   Lab Results  Component Value Date   WBC 6.8 08/22/2016   HGB 11.9 (L) 08/22/2016   HCT 37.4 08/22/2016   MCV 82.4 08/22/2016   PLT 198 08/22/2016   Lab Results  Component Value Date   DDIMER <0.27 08/22/2016   Troponin 0.00. She refused to stay for a second troponin.  CXR 08/22/16: No acute cardiopulmonary disease. Aortic atherosclerosis.  She has Hx of DM II and requested referral to endocrinologists, she has not receive appt information.She states that her "numbers are good." Also she was referred to cardiologists because she was concerned about some of chest CT findings and for HTN management.She cancelled appt, states that she could not afford co-pay.   Review of Systems  Constitutional: Positive for fatigue.  Negative for activity change, appetite change, fever and unexpected weight change.  HENT: Negative for mouth sores, nosebleeds and trouble swallowing.   Respiratory: Negative for cough, shortness of breath and wheezing.   Cardiovascular: Negative for palpitations and leg swelling.  Gastrointestinal: Negative for abdominal pain, nausea and vomiting.       Negative for changes in bowel habits.  Endocrine: Negative for polydipsia, polyphagia and polyuria.  Genitourinary: Negative for  decreased urine volume, dysuria and hematuria.  Musculoskeletal: Positive for arthralgias, back pain and myalgias. Negative for gait problem.  Skin: Negative for rash and wound.  Allergic/Immunologic: Positive for environmental allergies.  Neurological: Negative for syncope, weakness, numbness and headaches.  Hematological: Negative for adenopathy. Does not bruise/bleed easily.  Psychiatric/Behavioral: Negative for confusion. The patient is nervous/anxious.       Current Outpatient Prescriptions on File Prior to Visit  Medication Sig Dispense Refill  . albuterol (PROVENTIL HFA;VENTOLIN HFA) 108 (90 Base) MCG/ACT inhaler Inhale 1-2 puffs into the lungs every 6 (six) hours as needed for wheezing or shortness of breath. 1 Inhaler 1  . amLODipine (NORVASC) 10 MG tablet Take 1 tablet (10 mg total) by mouth daily. 90 tablet 1  . budesonide-formoterol (SYMBICORT) 80-4.5 MCG/ACT inhaler Inhale 2 puffs into the lungs 2 (two) times daily. 1 Inhaler 3  . cyclobenzaprine (FLEXERIL) 10 MG tablet Take 1 tablet (10 mg total) by mouth 3 (three) times daily as needed for muscle spasms. 12 tablet 0  . diclofenac sodium (VOLTAREN) 1 % GEL Apply 4 g topically 4 (four) times daily. 4 Tube 3  . empagliflozin (JARDIANCE) 25 MG TABS tablet Take 25 mg by mouth daily. 30 tablet 2  . glucose blood (ACCU-CHEK AVIVA PLUS) test strip Use to test blood sugars daily. 100 each 3  . losartan (COZAAR) 25 MG tablet Take 1 tablet (25 mg total) by mouth daily. 90 tablet 1  . metFORMIN (GLUCOPHAGE) 500 MG tablet TAKE 1 TABLET BY MOUTH WITH BREAKFAST AND TWO TABLETS WITH SUPPER. (Patient taking differently: TAKE 2 TABLET BY MOUTH WITH BREAKFAST AND TWO TABLETS WITH SUPPER.) 270 tablet 1   No current facility-administered medications on file prior to visit.      Past Medical History:  Diagnosis Date  . Arthritis   . Barrett's esophagus   . Diabetes mellitus   . GERD (gastroesophageal reflux disease)   . Hyperlipidemia   .  Hypertension   . Sleep apnea    does not tolerate cpap   Allergies  Allergen Reactions  . Contrast Media [Iodinated Diagnostic Agents]     Nauseated    Social History   Social History  . Marital status: Widowed    Spouse name: N/A  . Number of children: 3  . Years of education: N/A   Occupational History  . Retired Unemployed   Social History Main Topics  . Smoking status: Former Smoker    Packs/day: 0.80    Years: 30.00    Types: Cigarettes    Quit date: 09/15/2011  . Smokeless tobacco: Never Used     Comment: smoked 20 years; then quit 10; smoked 30 years plus  . Alcohol use No  . Drug use: No  . Sexual activity: Not Asked   Other Topics Concern  . None   Social History Narrative  . None    Vitals:   08/24/16 0809  BP: 130/80  Pulse: 89  Resp: 12   O2 sat at RA 95% Body mass index is 47.74 kg/m.  Physical Exam  Nursing note and vitals reviewed. Constitutional: She is oriented to person, place, and time. She appears well-developed. She does not appear ill. No distress.  HENT:  Head: Atraumatic.  Mouth/Throat: Oropharynx is clear and moist and mucous membranes are normal.  Eyes: Conjunctivae and EOM are normal.  Cardiovascular: Normal rate and regular rhythm.   No murmur heard. Pulses:      Radial pulses are 2+ on the right side.  Respiratory: Effort normal and breath sounds normal. No respiratory distress. She exhibits tenderness.  GI: Soft. She exhibits no mass. There is no hepatomegaly. There is no tenderness.  Musculoskeletal: She exhibits edema (Trace pitting LE edema,bilateral).       Cervical back: She exhibits decreased range of motion. She exhibits no tenderness and no bony tenderness.  Pain on medial aspect right forearm, eliceted with supination and pronation. Elbow and shoulder ROM do not cause pain. I do not appreciate defined mass on area she points: ulnar, ? A mobile 2-3 mm lesion I feel when I rub my finger on area but not  consistently. Right infraclavicular and condrocostal joints pain with palpation. Cervical ROM exacerbates pain.  Lymphadenopathy:    She has no cervical adenopathy.  Neurological: She is alert and oriented to person, place, and time. She has normal strength. Gait normal.  Skin: Skin is warm. No rash noted. No erythema.  Psychiatric: Her mood appears anxious. Her affect is blunt.  Fairly groomed, good eye contact.     ASSESSMENT AND PLAN:   Kitara was seen today for hospitalization follow-up.  Diagnoses and all orders for this visit:  Atypical chest pain  Reassured, I explained that pain seems to be musculoskeletal. I called to her attention that in fact she had work-up in the ER to rule out serious condition that may require an immediate intervention. She also had a chest CT in 02/2016. She is requesting a test to check "all her veins",pointing to her chest and right forearm. Explained that D-dimer was negative for DVT's and I do not appreciate vein dilations on extremity or chest. Strongly recommend re-scheduling appt with cardiologists, given her Hx of DM and risk of CVD.   Right forearm pain  ? Radicular,musculoskeletal. Recommend topical OTC Icy Hot with Lidocaine. On examination today I do not appreciate bone deformity. Offered ortho referral or plain imaging but she states that she knows it "is not bone" pain but rather "vein" problem.  -     [place] RT LOWER EXTREM LTD SOFT TISSUE NON VASCULAR; Future  Lesion of subcutaneous tissue  I do not palpate a defined mass, after rubbing a few times I felt a very small and mobile papular like lesion ? Ligament/tendon thickening, ? Lipoma. Offered soft tissue U/S and she agrees.   -     [place] RT LOWER EXTREM LTD SOFT TISSUE NON VASCULAR; Future  Chronic pain disorder  She already has appt with pain management, which she requested last OV because c/o "pain all over." ? Fibromyalgia. Hx of generalized OA and back pain.     8:20  am to 9:02 am > 50% was dedicated to counseling and discussion of differential Dx, probability of these problems may be related to a serious process based on work-up she has had so far and chronicity of symptoms. Prognosis, treatment options, and some side effects of medications as well as coordination of care.  She is not interested in adding medications. She thinks she is still taking Cymbalta 20 mg,which was added  last OV but it is not on her med list. Instructed her to let me know if she still has med for sure,we could start citrating up to 60 mg as tolerated. She would like to continue Flexeril, explained that if she is still on Cymbalta we may need to consider changing to another muscle relaxant, she also tells me Flexeril is not helping with pain. Extra time was dedicated to reviewing work-up she has had recently and clinical findings, I explained I do not find a specific sign that suggest a serious illness at this time, she is still upset. Pending endocrinology and pulmonology appt. Keep pain clinic appt. Re-schedule cardiology appt.      Betty G. [place], MD  [facility]. [place] office.

## 2016-08-23 NOTE — ED Provider Notes (Signed)
[facility]EMER[facility]ENCY DEPT Provider Note   CSN: [id number] Arrival date & time: 08/22/16  2032     History   Chief Complaint Chief Complaint  Patient presents with  . Chest Pain    HPI Kylie Wilson is a 66 y.o. female.  HPI 66 year old African-American female past medical history significant for GERD, hypertension, hyperlipidemia, diabetes, "blood clot on ovaries" presents to the emergency department today with complaints of right-sided chest pain that radiates to her right arm that has been ongoing for the past 2 days. The patient states that the pain as sharp in nature and is constant at first but however has become intermittent at this time. Worse with moving her arm. She reports a dry cough. Patient also states that it feels like there are knots in her right wrist. Chest pain is not pleuritic. Is not exertional. She denies any orthopnea or dyspnea on exertion. Denies any lower extremity edema or calf tenderness. Patient states she has had history of "blood clot" however I do not find any documentation of this. States she is not on blood thinner. Denies any recent hospitalizations/surgeries, prolonged immobilizations, hormone use, tobacco use. Denies any significant cardiac history. Has not tried her symptoms at home. Denies any fever, chills, headache, vision changes, lightheadedness, dizziness, abdominal pain, nausea, emesis, diaphoresis, urinary symptoms, change in bowel habits, paresthesias. Past Medical History:  Diagnosis Date  . Arthritis   . Barrett's esophagus   . Diabetes mellitus   . GERD (gastroesophageal reflux disease)   . Hyperlipidemia   . Hypertension   . Sleep apnea    does not tolerate cpap    Patient Active Problem List   Diagnosis Date Noted  . Generalized osteoarthritis of multiple sites 07/16/2016  . Low back pain at multiple sites 07/16/2016  . Cervicalgia 07/16/2016  . Chronic pain disorder 07/16/2016  . COPD (chronic obstructive pulmonary disease) with  chronic bronchitis (HCC) 04/11/2016  . 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 COPD (chronic obstructive pulmonary disease) (HCC) 02/17/2016  . Obesity due to excess calories 02/17/2016  . Unspecified constipation 09/22/2011  . HYPERLIPIDEMIA 08/27/2009  . DEPRESSION 08/27/2009  . Essential hypertension 08/27/2009  . Sleep apnea 08/27/2009  . DYSPNEA 08/27/2009  . BARRETTS ESOPHAGUS 08/07/2009  . ESOPHAGEAL REFLUX 08/07/2008  . COUGH 08/07/2008  . DIVERTICULOSIS OF COLON 11/17/2007    Past Surgical History:  Procedure Laterality Date  . BREAST BIOPSY  1980   left  . BREAST EXCISIONAL BIOPSY Left    Bening 1980's  . SCAR REVISION  2001   both legs  . TOE SURGERY  1981   4th and 5th toe right foot  . TOE SURGERY  2002   great toe left    OB History    No data available       Home Medications    Prior to Admission medications   Medication Sig Start Date End Date Taking? Authorizing Provider  albuterol (PROVENTIL HFA;VENTOLIN HFA) 108 (90 Base) MCG/ACT inhaler Inhale 1-2 puffs into the lungs every 6 (six) hours as needed for wheezing or shortness of breath. 02/17/16  Yes [place]Jordan, Betty G, MD  amLODipine (NORVASC) 10 MG tablet Take 1 tablet (10 mg total) by mouth daily. 07/13/16  Yes [place], Betty G, MD  budesonide-formoterol [facility](SYMBICORT) 80-4.5 MCG/ACT inhaler Inhale 2 puffs into the lungs 2 (two) times daily. 02/17/16  Yes [place], Betty G, MD  diclofenac sodium (VOLTAREN) 1 %  GEL Apply 4 g topically 4 (four) times daily. 07/16/16  Yes [place], Betty G, MD  empagliflozin (JARDIANCE) 25 MG TABS tablet Take 25 mg by mouth daily. 06/18/16  Yes [place], Betty G, MD  glucose blood (ACCU-CHEK AVIVA PLUS) test strip Use to test blood sugars daily. 08/03/16  Yes [place], Betty G, MD  losartan (COZAAR) 25 MG tablet Take 1 tablet (25 mg total) by mouth daily. 06/04/16  Yes [place], Betty G, MD    metFORMIN (GLUCOPHAGE) 500 MG tablet TAKE 1 TABLET BY MOUTH WITH BREAKFAST AND TWO TABLETS WITH SUPPER. Patient taking differently: TAKE 2 TABLET BY MOUTH WITH BREAKFAST AND TWO TABLETS WITH SUPPER. 05/20/16  Yes [place], Betty G, MD  cyclobenzaprine (FLEXERIL) 10 MG tablet Take 1 tablet (10 mg total) by mouth 3 (three) times daily as needed for muscle spasms. 08/23/16   Rise Mu, PA-C    Family History Family History  Problem Relation Age of Onset  . Heart disease Father   . Breast cancer Paternal Aunt   . Colon cancer Neg Hx   . Stomach cancer Neg Hx   . Colon polyps Neg Hx   . Rectal cancer Neg Hx     Social History Social History  Substance Use Topics  . Smoking status: Former Smoker    Packs/day: 0.80    Years: 30.00    Types: Cigarettes    Quit date: 09/15/2011  . Smokeless tobacco: Never Used     Comment: smoked 20 years; then quit 10; smoked 30 years plus  . Alcohol use No     Allergies   Contrast media [iodinated diagnostic agents]   Review of Systems Review of Systems  Constitutional: Negative for chills and fever.  HENT: Negative for ear pain and sore throat.   Eyes: Negative for pain and visual disturbance.  Respiratory: Negative for cough and shortness of breath.   Cardiovascular: Positive for chest pain. Negative for palpitations and leg swelling.  Gastrointestinal: Negative for abdominal pain, diarrhea, nausea and vomiting.  Genitourinary: Negative for dysuria and hematuria.  Musculoskeletal: Negative for arthralgias and back pain.  Skin: Negative for color change and rash.  Neurological: Negative for dizziness, syncope, weakness, light-headedness and headaches.  All other systems reviewed and are negative.    Physical Exam Updated Vital Signs BP 128/68   Pulse 78   Temp 98.7 F (37.1 C) (Oral)   Resp 18   SpO2 98%   Physical Exam  Constitutional: She is oriented to person, place, and time. She appears well-developed and  well-nourished. No distress.  Patient is well appearing. Nontoxic.  HENT:  Head: Normocephalic and atraumatic.  Mouth/Throat: Oropharynx is clear and moist.  Eyes: Conjunctivae and EOM are normal. Pupils are equal, round, and reactive to light. Right eye exhibits no discharge. Left eye exhibits no discharge. No scleral icterus.  Neck: Normal range of motion. Neck supple. No JVD present. No tracheal deviation present. No thyromegaly present.  Cardiovascular: Normal rate, regular rhythm, normal heart sounds and intact distal pulses.  Exam reveals no gallop and no friction rub.   No murmur heard. Pulmonary/Chest: Effort normal and breath sounds normal. No respiratory distress. She has no wheezes. She has no rales. She exhibits tenderness (right sided).  Abdominal: Soft. Bowel sounds are normal. She exhibits no distension. There is no tenderness. There is no rebound and no guarding.  Musculoskeletal: Normal range of motion.  No lower extremity edema or calf tenderness.  Lymphadenopathy:    She has no cervical  adenopathy.  Neurological: She is alert and oriented to person, place, and time.  Skin: Skin is warm and dry. Capillary refill takes less than 2 seconds.  I do not appreciate any knots in the right upper extremity. No edema or erythema noted.  Nursing note and vitals reviewed.    ED Treatments / Results  Labs (all labs ordered are listed, but only abnormal results are displayed) Labs Reviewed  BASIC METABOLIC PANEL - Abnormal; Notable for the following:       Result Value   Glucose, Bld 175 (*)    Calcium 8.8 (*)    All other components within normal limits  CBC - Abnormal; Notable for the following:    Hemoglobin 11.9 (*)    All other components within normal limits  D-DIMER, QUANTITATIVE (NOT AT [facility])  I-STAT TROPOININ, ED  I-STAT TROPOININ, ED    EKG  EKG Interpretation None       Radiology Dg Chest 2 View  Result Date: 08/22/2016 CLINICAL DATA:  Mid chest pain  and right arm pain x 2 days, SOB, HTN, DM, nonsmoker EXAM: CHEST  2 VIEW COMPARISON:  CT of the of 03/02/2016.  Chest radiograph 01/19/2016. FINDINGS: Mild right hemidiaphragm elevation. The Chin overlies the apices. Midline trachea. Normal heart size. Atherosclerosis in the transverse aorta. No pleural effusion or pneumothorax. Clear lungs. IMPRESSION: No acute cardiopulmonary disease. Aortic atherosclerosis. Electronically Signed   By: Jeronimo Greaves M.D.   On: 08/22/2016 21:37    Procedures Procedures (including critical care time)  Medications Ordered in ED Medications  ondansetron (ZOFRAN) injection 4 mg (4 mg Intravenous Given 08/22/16 2336)  ketorolac (TORADOL) 15 MG/ML injection 15 mg (15 mg Intravenous Given 08/22/16 2338)  gi cocktail (Maalox,Lidocaine,Donnatal) (30 mLs Oral Given 08/23/16 0212)     Initial Impression / Assessment and Plan / ED Course  I have reviewed the triage vital signs and the nursing notes.  Pertinent labs & imaging results that were available during my care of the patient were reviewed by me and considered in my medical decision making (see chart for details).     Patient is to be discharged with recommendation to follow up with PCP in regards to today's hospital visit. Chest pain is not likely of cardiac or pulmonary etiology d/t presentation, d dimer negative, VSS, no tracheal deviation, no JVD or new murmur, RRR, breath sounds equal bilaterally, EKG without acute abnormalities beside some nonspecific t wave abnormalites, negative troponin, patient refused second troponin. She states "this is not a heart attack and had a normal my blood drawn". This seems atypical for ACS. Patient's pain is worse with movement of the right arm that this is more musculoskeletal and patient agrees. We'll try warm compresses and muscle relaxers. Negative CXR. Pt has been advised to start a PPI and return to the ED is CP becomes exertional, associated with diaphoresis or nausea,  radiates to left jaw/arm, worsens or becomes concerning in any way. Pt appears reliable for follow up and is agreeable to discharge.   Case has been discussed with and seen by Dr. Corlis Leak who agrees with the above plan to discharge.    Final Clinical Impressions(s) / ED Diagnoses   Final diagnoses:  Atypical chest pain    New Prescriptions Discharge Medication List as of 08/23/2016  2:07 AM       Rise Mu, PA-C 08/23/16 0251    Corlis Leak, Cindee Salt, MD 08/23/16 0342    Abelino Derrick, MD 08/23/16 [phone]

## 2016-08-23 NOTE — ED Notes (Signed)
Attempted to draw lab from patient. Patient states "I do no want anymore blood drawn, I know I'm not having a heart attack and it is real hard to get blood anyway. I just want to go home." PA notified of patient's wishes.

## 2016-08-24 ENCOUNTER — Encounter: Payer: Self-pay | Admitting: Family Medicine

## 2016-08-24 ENCOUNTER — Ambulatory Visit (INDEPENDENT_AMBULATORY_CARE_PROVIDER_SITE_OTHER): Payer: Medicare Other | Admitting: Family Medicine

## 2016-08-24 ENCOUNTER — Telehealth: Payer: Self-pay | Admitting: *Deleted

## 2016-08-24 VITALS — BP 130/80 | HR 89 | Resp 12 | Ht 63.0 in | Wt 269.5 lb

## 2016-08-24 DIAGNOSIS — R0789 Other chest pain: Secondary | ICD-10-CM

## 2016-08-24 DIAGNOSIS — M79631 Pain in right forearm: Secondary | ICD-10-CM | POA: Diagnosis not present

## 2016-08-24 DIAGNOSIS — L989 Disorder of the skin and subcutaneous tissue, unspecified: Secondary | ICD-10-CM

## 2016-08-24 DIAGNOSIS — G894 Chronic pain syndrome: Secondary | ICD-10-CM

## 2016-08-24 NOTE — Telephone Encounter (Signed)
Arena called [facility] stating the ultrasound order is OK; however when she called to schedule this the pt stated she needs a exam for her chest?  Kylie Wilson stated she is not sure what to order for this? Please call [phone].

## 2016-08-24 NOTE — Patient Instructions (Signed)
A few things to remember from today's visit:   Atypical chest pain  Right forearm pain - Plan: [place] RT LOWER EXTREM LTD SOFT TISSUE NON VASCULAR  Lesion of subcutaneous tissue - Plan: [place] RT LOWER EXTREM LTD SOFT TISSUE NON VASCULAR  Osteoarthritis is a chronic condition and gets worse with age.  The following may help:  Over the counter topical medications: Icy Hot or Asper cream with Lidocaine. Tai Chi or PT. Fall prevention. Avoid weight gain. Fish oil, over the counter Megared for example, 2 capsules daily. Tumeric.     Please be sure medication list is accurate. If a new problem present, please set up appointment sooner than planned today.

## 2016-08-24 NOTE — Telephone Encounter (Signed)
Please advise 

## 2016-08-24 NOTE — Telephone Encounter (Signed)
Today I ordered U/S of soft tissue right forearm because tender "knot" she has noted. I did not order "chest exam." She was referred to cardiologists a few weeks ago and cancelled appt, so I recommended to re-schedule appt.  Thanks, BJ

## 2016-08-25 NOTE — Telephone Encounter (Addendum)
Patient called and wanted chest ultrasound. Advised of message below and she stated she would have to find a new doctor.

## 2016-08-26 ENCOUNTER — Telehealth: Payer: Self-pay | Admitting: Family Medicine

## 2016-08-26 NOTE — Telephone Encounter (Signed)
Pt has order for and US RLE Limited.  It is supposed to be for US RUE Limited.  The proper imaging code is "[id number]".  It was caught because the patient called in wanting to come in sooner.  If we can get this corrected ASAP then that may be an option for the patient.  Please call [facility] once this is corrected at [phone] and hit Option 4.  Debbe OdeaLatisha is who called, however she is leaving at 12:45pm, but she is making the other techs aware.

## 2016-08-27 ENCOUNTER — Telehealth: Payer: Self-pay | Admitting: Family Medicine

## 2016-08-27 ENCOUNTER — Ambulatory Visit
Admission: RE | Admit: 2016-08-27 | Discharge: 2016-08-27 | Disposition: A | Discharge status: Home / Self Care | Payer: Medicare Other | Source: Ambulatory Visit | Attending: Family Medicine | Admitting: Family Medicine

## 2016-08-27 ENCOUNTER — Inpatient Hospital Stay
Admission: RE | Admit: 2016-08-27 | Discharge: 2016-08-27 | Disposition: A | Discharge status: Home / Self Care | Payer: Medicare Other | Source: Ambulatory Visit | Attending: Family Medicine | Admitting: Family Medicine

## 2016-08-27 DIAGNOSIS — M79601 Pain in right arm: Secondary | ICD-10-CM

## 2016-08-27 DIAGNOSIS — M79631 Pain in right forearm: Secondary | ICD-10-CM | POA: Diagnosis not present

## 2016-08-27 NOTE — Telephone Encounter (Signed)
Order corrected; attempted to notify [facility]; no answer at extension provided.

## 2016-08-27 NOTE — Telephone Encounter (Signed)
Contacted patient to advise of Dr. Elvis CoilJordan's recommendations; patient states that what the MD has recommended will not work and patient ended conversation.

## 2016-08-27 NOTE — Telephone Encounter (Signed)
She can continue Tylenol 500 mg qid around the clock, topical OTC Icy hot with Lidocaine. Voltaren gel can be sent to her pharmacy or if she takes OTC Aleve 220 mg bid she needs to monitor BP closely. If she is taking Cymbalta 20 mg as she reported during visit [it is not on med list], this med could be increased to 30 mg.  She does not need a follow up appt, we will let her know about U/S results.  Keep appt with pain clinic.  Thanks, BJ

## 2016-08-27 NOTE — Telephone Encounter (Signed)
Pt called back and decided to kept her appt

## 2016-08-27 NOTE — Telephone Encounter (Signed)
Pt would like a pain med for the pain in her hand. Pt states it is so bad, she cannot take a bath or use at all.  Pt had her ultrasound just now, and made follow up appt for Monday with Dr SwazilandJordan.  [id number] - [place], [place] - [address]

## 2016-08-27 NOTE — Telephone Encounter (Signed)
Pt cancelled her appt and will come by to sign medical release form to obtain her records

## 2016-08-27 NOTE — Telephone Encounter (Signed)
Attempted to contact patient; no answer; message left for return call to office 

## 2016-08-30 ENCOUNTER — Ambulatory Visit (INDEPENDENT_AMBULATORY_CARE_PROVIDER_SITE_OTHER): Payer: Medicare Other | Admitting: Family Medicine

## 2016-08-30 ENCOUNTER — Encounter (INDEPENDENT_AMBULATORY_CARE_PROVIDER_SITE_OTHER): Payer: Self-pay

## 2016-08-30 ENCOUNTER — Ambulatory Visit: Payer: Medicare Other | Admitting: Family Medicine

## 2016-08-30 ENCOUNTER — Encounter: Payer: Self-pay | Admitting: Family Medicine

## 2016-08-30 ENCOUNTER — Ambulatory Visit (INDEPENDENT_AMBULATORY_CARE_PROVIDER_SITE_OTHER): Payer: Medicare Other | Admitting: Orthopaedic Surgery

## 2016-08-30 VITALS — BP 110/70 | HR 77 | Resp 12 | Ht 63.0 in | Wt 268.0 lb

## 2016-08-30 DIAGNOSIS — G894 Chronic pain syndrome: Secondary | ICD-10-CM | POA: Diagnosis not present

## 2016-08-30 DIAGNOSIS — G8929 Other chronic pain: Secondary | ICD-10-CM | POA: Diagnosis not present

## 2016-08-30 DIAGNOSIS — M159 Polyosteoarthritis, unspecified: Secondary | ICD-10-CM | POA: Diagnosis not present

## 2016-08-30 DIAGNOSIS — M25531 Pain in right wrist: Secondary | ICD-10-CM

## 2016-08-30 MED ORDER — MELOXICAM 15 MG PO TABS: 15.0000 mg | ORAL_TABLET | Freq: Every day | ORAL | 0 refills | Status: DC

## 2016-08-30 NOTE — Patient Instructions (Signed)
A few things to remember from today's visit:   Chronic pain of right wrist - Plan: MR WRIST RIGHT WO CONTRAST, meloxicam (MOBIC) 15 MG tablet, Ambulatory referral to Orthopedic Surgery  Generalized osteoarthritis of multiple sites  Chronic pain disorder  I am sorry you decided to transfer care but I understand, I wish you well.  You have prescriptions for Mobic for 30 days.Monitor blood pressure periodically.   Follow with new PCP in 2 months. Keep appointment with cardiologists and pain management.  Consider re-scheduling appointment with pulmonologist for sleep apnea.   Please be sure medication list is accurate. If a new problem present, please set up appointment sooner than planned today.

## 2016-08-30 NOTE — Progress Notes (Signed)
HPI:   [name] is a 66 y.o. female, who is here today to follow on recent U/S.   She was seen on 08/24/16 when she was following on recent ER visit. She was also c/o right chest wall pain and right forearm pain, both she thinks are related. She has noted "lump" on ulnar aspect of right forearm, very tender.Pain is radiated from ulnar aspect of right wrist to medial right under elbow. Pain is exacerbated by movement, certain activities and palpation. No erythema. Today she mentions numb sensation on area, intermittent, alleviated by local ice. In general pain seems to be better but states that it is because she is not doing much, during the day, trying to avoid pain. She denies cervical pain.  Last OV I could not identify specific mass or significant abnormality on area of concern. She states that "everybody" can find it but me.  Soft tissue U/S was ordered, I explained results and recommend ortho evaluation. She states that she does not want more medications or injections ,she wants to find out what is causing pain.   She would like for me to read report to her and would like a copy.  1. No obvious mass or ganglion cyst. 2. Questionable tendinopathy of the extensor carpi ulnaris tendon.This could be more specifically characterized with MRI if clinically feasible. She wants MRI done.   Today she is very upset.  She has already decided to transfer care.   She tells me that she has had pain all over for years, "I am used to pain" but this pain she is having seems worse that her usual level of pain. She asked me if I do not prescribed "pain medications" to patients who are in pain, then she adds "it is because I am back", "I do not use drugs." She tells me that she has taken Oxycodone and Percocet before and she never abused medications. She took it when she needed it and through away medications when she did not longer needed it. She adds "so you would not give me any pain  medication if I have surgery."   She tells me she she knows once pain starts, if it is not well controlled from the beginning, nothing I recommended will help.  She tells me that she took Cymbalta 20 mg for about a month and discontinued because did not help.She denies side effects.   She is also upset about the fact I did not add chest U/S when she was having forearm soft tissue U/S. She states that she could have this to check her "veins" on chest, she is positive right-sided chest pain is related to RUE pain. She has had this pain for a while but worse for the past few months, she had chest CT done in 02/2016.  Chest CT 03/02/16: No acute chest pathology evident.   Few scattered benign subpleural pulmonary nodules that do not require further follow-up.   Few low-density nodes in the right hilar region, the largest measuring 18 mm at the inferior hilum. As an isolated finding, these are not likely significant.   Thoracic aortic atherosclerosis. Coronary artery atherosclerosis. Small pericardial effusion.  She denies any injury. She has an appt with pain clinic in 09/2015.  She rescheduled her appt with cardiologists. She tells me that she cancelled appt with pulmonologist. In regard to endocrinologists, she has not heard from appt, states that she had a phone call, not sure of it was from endo's  office but she could not understand what caller was saying.She does not feel like endocrinologists appt is needed, she feels like her DM II is well controlled.    Review of Systems  Constitutional: Positive for fatigue (no more than usual). Negative for appetite change, fever and unexpected weight change.  HENT: Negative for mouth sores, nosebleeds and trouble swallowing.   Respiratory: Negative for shortness of breath, wheezing and stridor.   Cardiovascular: Negative for palpitations and leg swelling.  Gastrointestinal: Negative for abdominal pain, nausea and vomiting.       Negative  for changes in bowel habits.  Musculoskeletal: Positive for arthralgias, back pain and myalgias. Negative for gait problem and neck pain.  Skin: Negative for rash and wound.  Neurological: Positive for numbness. Negative for weakness and headaches.  Hematological: Negative for adenopathy. Does not bruise/bleed easily.  Psychiatric/Behavioral: Negative for confusion. The patient is nervous/anxious.       Current Outpatient Prescriptions on File Prior to Visit  Medication Sig Dispense Refill  . amLODipine (NORVASC) 10 MG tablet Take 1 tablet (10 mg total) by mouth daily. 90 tablet 1  . empagliflozin (JARDIANCE) 25 MG TABS tablet Take 25 mg by mouth daily. 30 tablet 2  . glucose blood (ACCU-CHEK AVIVA PLUS) test strip Use to test blood sugars daily. 100 each 3  . losartan (COZAAR) 25 MG tablet Take 1 tablet (25 mg total) by mouth daily. 90 tablet 1  . metFORMIN (GLUCOPHAGE) 500 MG tablet TAKE 1 TABLET BY MOUTH WITH BREAKFAST AND TWO TABLETS WITH SUPPER. (Patient taking differently: TAKE 2 TABLET BY MOUTH WITH BREAKFAST AND TWO TABLETS WITH SUPPER.) 270 tablet 1   No current facility-administered medications on file prior to visit.      Past Medical History:  Diagnosis Date  . Arthritis   . Barrett's esophagus   . Diabetes mellitus   . GERD (gastroesophageal reflux disease)   . Hyperlipidemia   . Hypertension   . Sleep apnea    does not tolerate cpap   Allergies  Allergen Reactions  . Contrast Media [Iodinated Diagnostic Agents]     Nauseated    Social History   Social History  . Marital status: Widowed    Spouse name: N/A  . Number of children: 3  . Years of education: N/A   Occupational History  . Retired Unemployed   Social History Main Topics  . Smoking status: Former Smoker    Packs/day: 0.80    Years: 30.00    Types: Cigarettes    Quit date: 09/15/2011  . Smokeless tobacco: Never Used     Comment: smoked 20 years; then quit 10; smoked 30 years plus  . Alcohol  use No  . Drug use: No  . Sexual activity: Not Asked   Other Topics Concern  . None   Social History Narrative  . None    Vitals:   08/30/16 1201  BP: 110/70  Pulse: 77  Resp: 12  O2 sat at RA 95% Body mass index is 47.47 kg/m.   Physical Exam  Nursing note and vitals reviewed. Constitutional: She is oriented to person, place, and time. She appears well-developed. No distress.  HENT:  Head: Atraumatic.  Eyes: Conjunctivae and EOM are normal.  Cardiovascular: Normal rate and regular rhythm.   No murmur heard. Pulses:      Radial pulses are 2+ on the right side, and 2+ on the left side.  Respiratory: Effort normal and breath sounds normal. No respiratory distress.  Musculoskeletal:  Right forearm: Mild tenderness upon palpation ulnar aspect. No erythema or edema appreciated. Limitation of ROM due to pain, no deformity. Elbow ROM normal,it does not elicit pain.   Lymphadenopathy:    She has no cervical adenopathy.  Neurological: She is alert and oriented to person, place, and time. She has normal strength. Gait normal.  Skin: Skin is warm. No rash noted. No erythema.  Psychiatric: Her mood appears anxious. Her affect is blunt.  Well groomed, good eye contact.     ASSESSMENT AND PLAN:   Liborio NixonJanice was seen today for follow-up.  Diagnoses and all orders for this visit:  Chronic pain of right wrist  Demanding MRI to be done. She also agrees with ortho evaluation. Local ice has helped,so continue as needed. ? Radicular pain.She does not think it is related to cervical spine.  Copy of report given.  -     MR WRIST RIGHT WO CONTRAST; Future -     meloxicam (MOBIC) 15 MG tablet; Take 1 tablet (15 mg total) by mouth daily. -     Ambulatory referral to Orthopedic Surgery  Generalized osteoarthritis of multiple sites  Educated about side effects of chronic NSAID's. She is not satisfied with recommended treatment but agrees with trying. Recommend monitoring BP  periodically. F/U in 2 months with new PCP. -     meloxicam (MOBIC) 15 MG tablet; Take 1 tablet (15 mg total) by mouth daily.  Chronic pain disorder  Keep appt with pain clinic. Educated about current recommendations in regard to opioid use. Explained that this is the first time she was on opioid treatment before. Some side effects of opioids in general discussed.  -     meloxicam (MOBIC) 15 MG tablet; Take 1 tablet (15 mg total) by mouth daily.   25 min face to face OV. > 50% was dedicated to discussion of differential Dx, prognosis,my findings and assessment.  We reviewed labs and imaging done before, including chest CT.  Explained that the fact I do not prescribe opioids to her has nothing to do with race or suspicion of abuse but rather I am concerned about safety and risks of side effects. She has Hx of OSA and COPD among some.  She apologized at the end of her visit.      Leonela Kivi G. [place]Jordan, MD  [facility]. [place] office.

## 2016-09-12 NOTE — Progress Notes (Signed)
Cardiology Office Note   Date:  09/13/2016   ID:  Kylie Wilson, DOB 03/04/1951, MRN [id number]  PCP:  Kylie, Betty G, MD  Cardiologist:   Chilton Si, MD   No chief complaint on file.    History of Present Illness: Kylie Wilson is a 66 y.o. female with hypertension, hyperlipidemia, diabetes, prior PE, and OSA who is being seen today for the evaluation of chest pain at the request of Kylie, Timoteo Expose, MD.  Kylie Wilson was seen in the ED 08/23/16 with chest pain.  Troponin was negative x1 and she left before a second could be drawn.  EKG was unremarkable and d-dimer was negative.  She was discharged and followed up with Dr. Swaz[place]and the following day.  Her pain was felt to be musculoskeletal, but she was encouraged to follow up with cardiology.  She saw Dr. Swaziland on 6/18 and continued to report R UE and right sided chest pain.  She reports intermittent chest pain for the last 2 or 3 years. The episodes are substernal and moderate in intensity. She typically notes dull chest pressure that does not radiate. She did have one episode of right-sided chest pain that went into her right arm. She thinks that this was due to her neck. The episodes are associated with shortness of breath, nausea, and diaphoresis. She notes that the chest pain tends to happen in the setting of stressful situations. She has not had any in the last month.  She does have shortness of breath when walking short distances but denies exertional chest pain.    She has rare edema in her feet and endorses orthopnea but no PND.   Ms. 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  There was also a small pericardial effusion.  She notes that her diet has been very poor in the past. 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 Lately she's been making an effort to improve her diet. Her husband passed away 2016/02/02. At that time she has not been very  active. She has not been feeling depressed but has been angry. She feels that the nursing home killed him.  Past Medical History:  Diagnosis Date  . Arthritis   . Barrett's esophagus   . Diabetes mellitus   . GERD (gastroesophageal reflux disease)   . Hyperlipidemia   . Hypertension   . Sleep apnea    does not tolerate cpap    Past Surgical History:  Procedure Laterality Date  . BREAST BIOPSY  1980   left  . BREAST EXCISIONAL BIOPSY Left    Bening 1980's  . SCAR REVISION  2001   both legs  . TOE SURGERY  1981   4th and 5th toe right foot  . TOE SURGERY  2002   great toe left     Current Outpatient Prescriptions  Medication Sig Dispense Refill  . amLODipine (NORVASC) 10 MG tablet Take 1 tablet (10 mg total) by mouth daily. 90 tablet 1  . aspirin EC 81 MG tablet Take 81 mg by mouth daily.    Marland Kitchen glucose blood (ACCU-CHEK AVIVA PLUS) test strip Use to test blood sugars daily. 100 each 3  . losartan (COZAAR) 25 MG tablet Take 1 tablet (25 mg total) by mouth daily. 90 tablet 1  . metFORMIN (GLUCOPHAGE) 500 MG tablet TAKE 1 TABLET BY MOUTH WITH BREAKFAST AND TWO TABLETS WITH SUPPER. 270 tablet 1  .  rosuvastatin (CRESTOR) 20 MG tablet Take 1 tablet (20 mg total) by mouth daily. 30 tablet 5   No current facility-administered medications for this visit.     Allergies:   Contrast media [iodinated diagnostic agents]    Social History:  The patient  reports that she quit smoking about 5 years ago. Her smoking use included Cigarettes. She has a 24.00 pack-year smoking history. She has never used smokeless tobacco. She reports that she does not drink alcohol or use drugs.   Family History:  The patient's family history includes Breast cancer in her paternal aunt; Heart disease in her brother and father; Pancreatic cancer in her father; Stroke in her mother.    ROS:  Please see the history of present illness.   Otherwise, review of systems are positive for indigestion.   All other  systems are reviewed and negative.    PHYSICAL EXAM: VS:  BP 122/80   Pulse 72   Ht 5\' 3"  (1.6 m)   Wt 121.7 kg (268 lb 6.4 oz)   BMI 47.54 kg/m  , BMI Body mass index is 47.54 kg/m. GENERAL:  Well appearing HEENT:  Pupils equal round and reactive, fundi not visualized, oral mucosa unremarkable NECK:  No jugular venous distention, waveform within normal limits, carotid upstroke brisk and symmetric, no bruits, no thyromegaly LYMPHATICS:  No cervical adenopathy LUNGS:  Clear to auscultation bilaterally HEART:  RRR.  PMI not displaced or sustained,S1 and S2 within normal limits, no S3, no S4, no clicks, no rubs, no murmurs ABD:  Flat, positive bowel sounds normal in frequency in pitch, no bruits, no rebound, no guarding, no midline pulsatile mass, no hepatomegaly, no splenomegaly EXT:  2 plus pulses throughout, no edema, no cyanosis no clubbing SKIN:  No rashes no nodules NEURO:  Cranial nerves II through XII grossly intact, motor grossly intact throughout PSYCH:  Cognitively intact, oriented to person place and time   EKG:  EKG is not ordered today. The ekg ordered 08/23/16 demonstrates Sinus rhythm. Rate 75 bpm. Nonspecific T wave abnormalities.  Recent Labs: 08/22/2016: BUN 10; Creatinine, Ser 0.77; Hemoglobin 11.9; Platelets 198; Potassium 3.8; Sodium 137    Lipid Panel    Component Value Date/Time   CHOL 197 06/04/2016 1114   TRIG 73.0 06/04/2016 1114   HDL 56.00 06/04/2016 1114   CHOLHDL 4 06/04/2016 1114   VLDL 14.6 06/04/2016 1114   LDLCALC 126 (H) 06/04/2016 1114      Wt Readings from Last 3 Encounters:  09/13/16 121.7 kg (268 lb 6.4 oz)  08/30/16 121.6 kg (268 lb)  08/24/16 122.2 kg (269 lb 8 oz)      ASSESSMENT AND PLAN:  # Atypical chest pain:  Chest pain is atypical and seems to have been mostly stressful situations. She denies exertional symptoms. She does have several risk factors and coronary calcifications on CT, so we will get an exercise Myoview to  better assess. Exercise Myoview  # Hypertension:  Blood pressure is well-controlled.  Continue amlodipine and losartan.  # Hyperlipidemia: ASCVD 10 year risk 19.1%.  Aspirin 81 mg daily and rosuvastatin 20 mg daily. Repeat lipids and CMP in 6 weeks.  # Morbid obesity: Encouraged diet and exercise.    Current medicines are reviewed at length with the patient today.  The patient does not have concerns regarding medicines.  The following changes have been made:  Start aspirin 81mg  and rosuvastatin 20 mg daily.   Labs/ tests ordered today include:   Orders Placed This  Encounter  Procedures  . Myocardial Perfusion Imaging     Disposition:   FU with Lacorey Brusca C. Duke Salvia, MD, [facility] in 6 weeks.    This note was written with the assistance of speech recognition software.  Please excuse any transcriptional errors.  Signed, Skie Vitrano C. Duke Salvia, MD, [facility]  09/13/2016 10:34 AM    [facility]

## 2016-09-13 ENCOUNTER — Encounter: Payer: Self-pay | Admitting: Cardiovascular Disease

## 2016-09-13 ENCOUNTER — Ambulatory Visit (INDEPENDENT_AMBULATORY_CARE_PROVIDER_SITE_OTHER): Payer: Medicare Other | Admitting: Cardiovascular Disease

## 2016-09-13 VITALS — BP 122/80 | HR 72 | Ht 63.0 in | Wt 268.4 lb

## 2016-09-13 DIAGNOSIS — I10 Essential (primary) hypertension: Secondary | ICD-10-CM | POA: Diagnosis not present

## 2016-09-13 DIAGNOSIS — R079 Chest pain, unspecified: Secondary | ICD-10-CM | POA: Diagnosis not present

## 2016-09-13 DIAGNOSIS — E7800 Pure hypercholesterolemia, unspecified: Secondary | ICD-10-CM

## 2016-09-13 MED ORDER — ROSUVASTATIN CALCIUM 20 MG PO TABS: 20.0000 mg | ORAL_TABLET | Freq: Every day | ORAL | 5 refills | Status: DC

## 2016-09-13 NOTE — Patient Instructions (Addendum)
Medication Instructions:  START ROSUVASTATIN 20 MG DAILY  START ASPIRIN 81 MG DAILY   Labwork: FASTING LP/CMET IN ABOUT 6 WEEKS WHEN YOU RETURN   Testing/Procedures: Your physician has requested that you have en exercise stress myoview. For further information please visit [email or web address]. Please follow instruction sheet, as given.  Follow-Up: Your physician recommends that you schedule a follow-up appointment in: 6 WEEKS OV  If you need a refill on your cardiac medications before your next appointment, please call your pharmacy.

## 2016-09-16 ENCOUNTER — Encounter: Payer: Medicare Other | Attending: Physical Medicine & Rehabilitation | Admitting: Physical Medicine & Rehabilitation

## 2016-09-16 ENCOUNTER — Ambulatory Visit
Admission: RE | Admit: 2016-09-16 | Discharge: 2016-09-16 | Disposition: A | Discharge status: Home / Self Care | Payer: Medicare Other | Source: Ambulatory Visit | Attending: Family Medicine | Admitting: Family Medicine

## 2016-09-16 ENCOUNTER — Encounter: Payer: Self-pay | Admitting: Physical Medicine & Rehabilitation

## 2016-09-16 VITALS — BP 122/76 | HR 71 | Resp 14

## 2016-09-16 DIAGNOSIS — M542 Cervicalgia: Secondary | ICD-10-CM | POA: Insufficient documentation

## 2016-09-16 DIAGNOSIS — M79601 Pain in right arm: Secondary | ICD-10-CM

## 2016-09-16 DIAGNOSIS — M1990 Unspecified osteoarthritis, unspecified site: Secondary | ICD-10-CM | POA: Diagnosis not present

## 2016-09-16 DIAGNOSIS — M545 Low back pain, unspecified: Secondary | ICD-10-CM

## 2016-09-16 DIAGNOSIS — M2140 Flat foot [pes planus] (acquired), unspecified foot: Secondary | ICD-10-CM | POA: Diagnosis not present

## 2016-09-16 DIAGNOSIS — I10 Essential (primary) hypertension: Secondary | ICD-10-CM | POA: Insufficient documentation

## 2016-09-16 DIAGNOSIS — G8929 Other chronic pain: Secondary | ICD-10-CM | POA: Insufficient documentation

## 2016-09-16 DIAGNOSIS — J449 Chronic obstructive pulmonary disease, unspecified: Secondary | ICD-10-CM | POA: Insufficient documentation

## 2016-09-16 DIAGNOSIS — K219 Gastro-esophageal reflux disease without esophagitis: Secondary | ICD-10-CM | POA: Insufficient documentation

## 2016-09-16 DIAGNOSIS — K2270 Barrett's esophagus without dysplasia: Secondary | ICD-10-CM | POA: Diagnosis not present

## 2016-09-16 DIAGNOSIS — M2142 Flat foot [pes planus] (acquired), left foot: Secondary | ICD-10-CM

## 2016-09-16 DIAGNOSIS — Z87891 Personal history of nicotine dependence: Secondary | ICD-10-CM | POA: Insufficient documentation

## 2016-09-16 DIAGNOSIS — E785 Hyperlipidemia, unspecified: Secondary | ICD-10-CM | POA: Diagnosis not present

## 2016-09-16 DIAGNOSIS — M2141 Flat foot [pes planus] (acquired), right foot: Secondary | ICD-10-CM

## 2016-09-16 DIAGNOSIS — G4730 Sleep apnea, unspecified: Secondary | ICD-10-CM | POA: Insufficient documentation

## 2016-09-16 DIAGNOSIS — E119 Type 2 diabetes mellitus without complications: Secondary | ICD-10-CM | POA: Diagnosis not present

## 2016-09-16 DIAGNOSIS — M25531 Pain in right wrist: Principal | ICD-10-CM

## 2016-09-16 DIAGNOSIS — M19031 Primary osteoarthritis, right wrist: Secondary | ICD-10-CM | POA: Diagnosis not present

## 2016-09-16 MED ORDER — GABAPENTIN 100 MG PO CAPS: 100.0000 mg | ORAL_CAPSULE | Freq: Three times a day (TID) | ORAL | 1 refills | Status: DC

## 2016-09-16 MED ORDER — METHOCARBAMOL 500 MG PO TABS: 500.0000 mg | ORAL_TABLET | Freq: Three times a day (TID) | ORAL | 1 refills | Status: DC | PRN

## 2016-09-16 MED ORDER — MELOXICAM 15 MG PO TABS: 15.0000 mg | ORAL_TABLET | Freq: Every day | ORAL | 1 refills | Status: DC

## 2016-09-16 MED ORDER — DULOXETINE HCL 30 MG PO CPEP: 30.0000 mg | ORAL_CAPSULE | Freq: Every day | ORAL | 1 refills | Status: DC
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
## 2016-09-20 ENCOUNTER — Ambulatory Visit (INDEPENDENT_AMBULATORY_CARE_PROVIDER_SITE_OTHER): Payer: Medicare Other | Admitting: Orthopedic Surgery

## 2016-09-21 ENCOUNTER — Telehealth (HOSPITAL_COMMUNITY): Payer: Self-pay

## 2016-09-21 NOTE — Telephone Encounter (Signed)
Encounter complete. 

## 2016-09-22 ENCOUNTER — Telehealth: Payer: Self-pay | Admitting: Family Medicine

## 2016-09-22 ENCOUNTER — Telehealth: Payer: Self-pay | Admitting: *Deleted

## 2016-09-22 ENCOUNTER — Telehealth (HOSPITAL_COMMUNITY): Payer: Self-pay

## 2016-09-22 MED ORDER — METHOCARBAMOL 500 MG PO TABS: 500.0000 mg | ORAL_TABLET | Freq: Three times a day (TID) | ORAL | 1 refills | Status: DC | PRN

## 2016-09-22 MED ORDER — MELOXICAM 15 MG PO TABS: 15.0000 mg | ORAL_TABLET | Freq: Every day | ORAL | 1 refills | Status: DC

## 2016-09-22 MED ORDER — GABAPENTIN 100 MG PO CAPS: 100.0000 mg | ORAL_CAPSULE | Freq: Three times a day (TID) | ORAL | 1 refills | Status: DC

## 2016-09-22 MED ORDER — DULOXETINE HCL 30 MG PO CPEP: 30.0000 mg | ORAL_CAPSULE | Freq: Every day | ORAL | 1 refills | Status: DC

## 2016-09-22 NOTE — Telephone Encounter (Signed)
Encounter complete. 

## 2016-09-22 NOTE — Telephone Encounter (Signed)
Kylie NixonJanice called about her medications ordered from her appt with Dr Allena KatzPatel.  She has been waiting for a call from [facility] saying they are ready. They say [facility] do not have [facility]. [facility]  [facility]  She is also asking about her orthotics and PT referrals.  I have checked with April and they will call her. Notified [name]

## 2016-09-22 NOTE — Telephone Encounter (Signed)
Pt would like to have someone to call her with her MRI results.

## 2016-09-22 NOTE — Telephone Encounter (Signed)
Informed patient of results and patient verbalized understanding.  

## 2016-09-23 ENCOUNTER — Ambulatory Visit (HOSPITAL_COMMUNITY)
Admission: RE | Admit: 2016-09-23 | Discharge: 2016-09-23 | Disposition: A | Discharge status: Home / Self Care | Payer: Medicare Other | Source: Ambulatory Visit | Attending: Cardiovascular Disease | Admitting: Cardiovascular Disease

## 2016-09-23 DIAGNOSIS — Z6841 Body Mass Index (BMI) 40.0 and over, adult: Secondary | ICD-10-CM | POA: Insufficient documentation

## 2016-09-23 DIAGNOSIS — Z8249 Family history of ischemic heart disease and other diseases of the circulatory system: Secondary | ICD-10-CM | POA: Insufficient documentation

## 2016-09-23 DIAGNOSIS — R079 Chest pain, unspecified: Secondary | ICD-10-CM

## 2016-09-23 DIAGNOSIS — I10 Essential (primary) hypertension: Secondary | ICD-10-CM | POA: Diagnosis not present

## 2016-09-23 DIAGNOSIS — E669 Obesity, unspecified: Secondary | ICD-10-CM | POA: Diagnosis not present

## 2016-09-23 DIAGNOSIS — R5383 Other fatigue: Secondary | ICD-10-CM | POA: Insufficient documentation

## 2016-09-23 DIAGNOSIS — E119 Type 2 diabetes mellitus without complications: Secondary | ICD-10-CM | POA: Insufficient documentation

## 2016-09-23 DIAGNOSIS — G4733 Obstructive sleep apnea (adult) (pediatric): Secondary | ICD-10-CM | POA: Diagnosis not present

## 2016-09-23 DIAGNOSIS — Z87891 Personal history of nicotine dependence: Secondary | ICD-10-CM | POA: Diagnosis not present

## 2016-09-23 DIAGNOSIS — R0609 Other forms of dyspnea: Secondary | ICD-10-CM | POA: Diagnosis not present

## 2016-09-23 MED ORDER — REGADENOSON 0.4 MG/5ML IV SOLN
0.4000 mg | Freq: Once | INTRAVENOUS | Status: AC
  Administered 2016-09-23: 0.4 mg via INTRAVENOUS

## 2016-09-23 MED ORDER — TECHNETIUM TC 99M TETROFOSMIN IV KIT
30.1000 | PACK | Freq: Once | INTRAVENOUS | Status: AC | PRN
  Administered 2016-09-23: 30.1 via INTRAVENOUS
  Filled 2016-09-23: qty 31

## 2016-09-24 ENCOUNTER — Ambulatory Visit (HOSPITAL_COMMUNITY)
Admission: RE | Admit: 2016-09-24 | Discharge: 2016-09-24 | Disposition: A | Discharge status: Home / Self Care | Payer: Medicare Other | Source: Ambulatory Visit | Attending: Cardiology | Admitting: Cardiology

## 2016-09-24 LAB — MYOCARDIAL PERFUSION IMAGING
LV dias vol: 82 mL (ref 46–106)
LV sys vol: 25 mL
Peak HR: 88 {beats}/min
Rest HR: 63 {beats}/min
SDS: 3
SRS: 1
SSS: 4
TID: 0.99

## 2016-09-24 MED ORDER — TECHNETIUM TC 99M TETROFOSMIN IV KIT
31.0000 | PACK | Freq: Once | INTRAVENOUS | Status: AC | PRN
  Administered 2016-09-24: 31 via INTRAVENOUS

## 2016-09-29 ENCOUNTER — Telehealth: Payer: Self-pay | Admitting: Cardiovascular Disease

## 2016-09-29 NOTE — Telephone Encounter (Signed)
New message      Pt c/o medication issue:  1. Name of Medication:  Generic crestor  2. How are you currently taking this medication (dosage and times per day)? 20mg  daily  3. Are you having a reaction (difficulty breathing--STAT)? no 4. What is your medication issue? Pt states that she will no longer take this medication because it makes her muscles and joints ache.  She also has muscle cramps.  Pt also states that her PCP will be taking over the cholesterol issue.  Since pt is stopping the medication, will she still need to keep her 8-16 Dr Duke Salviaandolph appt?

## 2016-09-29 NOTE — Telephone Encounter (Signed)
Returned call to patient, patient requesting to cancel her upcoming appointment with Dr. Duke Salviaandolph.  Reports she had her stress test and it was normal, also due to financial issues and having to pay for her visits with Dr. Duke Salviaandolph.   Reports she will be following with her primary MD and will call if she needs to be seen.  Encouraged to keep follow up as scheduled but patient request to cancel.   Patient wanted to thank Dr. Duke Salviaandolph and will call if she has any issues or concerns that require her to be seen again.     Routed to MD to make aware.

## 2016-10-05 ENCOUNTER — Encounter: Payer: Self-pay | Admitting: Physical Therapy

## 2016-10-05 ENCOUNTER — Ambulatory Visit: Payer: Medicare Other | Attending: Physical Medicine & Rehabilitation | Admitting: Physical Therapy

## 2016-10-05 DIAGNOSIS — M6281 Muscle weakness (generalized): Secondary | ICD-10-CM | POA: Diagnosis not present

## 2016-10-05 DIAGNOSIS — G8929 Other chronic pain: Secondary | ICD-10-CM | POA: Insufficient documentation

## 2016-10-05 DIAGNOSIS — M545 Low back pain, unspecified: Secondary | ICD-10-CM

## 2016-10-05 DIAGNOSIS — R262 Difficulty in walking, not elsewhere classified: Secondary | ICD-10-CM | POA: Insufficient documentation

## 2016-10-05 DIAGNOSIS — M542 Cervicalgia: Secondary | ICD-10-CM
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
## 2016-10-07 ENCOUNTER — Encounter: Payer: Self-pay | Admitting: Physical Therapy

## 2016-10-07 ENCOUNTER — Ambulatory Visit: Payer: Medicare Other | Admitting: Physical Therapy

## 2016-10-07 DIAGNOSIS — M545 Low back pain, unspecified: Secondary | ICD-10-CM

## 2016-10-07 DIAGNOSIS — M542 Cervicalgia: Secondary | ICD-10-CM | POA: Diagnosis not present

## 2016-10-07 DIAGNOSIS — G8929 Other chronic pain: Secondary | ICD-10-CM

## 2016-10-07 DIAGNOSIS — R262 Difficulty in walking, not elsewhere classified: Secondary | ICD-10-CM

## 2016-10-07 DIAGNOSIS — M6281 Muscle weakness (generalized): Secondary | ICD-10-CM | POA: Diagnosis not present

## 2016-10-07 NOTE — Therapy (Addendum)
[facility] [address] [place], [place], [address] Phone: [phone]   Fax:  [phone]  Physical Therapy Treatment  Patient Details  Name: Kylie Wilson MRN: [id number] Date of Birth: 04/03/1950 Referring Provider: Maryla MorrowAnkit Patel, MD  Encounter Date: 10/07/2016      PT End of Session - 10/07/16 1148    Visit Number 2   Number of Visits 16   Date for PT Re-Evaluation 12/06/16   Authorization Type UHC/Medicare, KX modifier at visit 15, G-codes every 10th visit   PT Start Time 1147   PT Stop Time 1225   PT Time Calculation (min) 38 min   Activity Tolerance Patient tolerated treatment well   Behavior [facility] for tasks assessed/performed      Past Medical History:  Diagnosis Date  . Arthritis   . Barrett's esophagus   . Diabetes mellitus   . GERD (gastroesophageal reflux disease)   . Hyperlipidemia   . Hypertension   . Sleep apnea    does not tolerate cpap    Past Surgical History:  Procedure Laterality Date  . BREAST BIOPSY  1980   left  . BREAST EXCISIONAL BIOPSY Left    Bening 1980's  . SCAR REVISION  2001   both legs  . TOE SURGERY  1981   4th and 5th toe right foot  . TOE SURGERY  2002   great toe left    There were no vitals filed for this visit.      Subjective Assessment - 10/07/16 1148    Subjective Afraid I am pinching nerves with stretches. Exercises are helpful. Rubbed olive oil on legs which helped. Neck 1/10 pain   Patient Stated Goals I want to stop hurting, I need to get back to being active, pt reporting her husband passed in December and she has decreased her activity level and wants to get back to what she was, "I want to get back to work"   Currently in Pain? Yes   Pain Score 3    Pain Location Back   Pain Orientation Lower   Pain Descriptors / Indicators Sharp                         [facility] Adult PT Treatment/Exercise - 10/07/16 0001      Neck  Exercises: Seated   Other Seated Exercise strugs   Other Seated Exercise scapular retractions     Neck Exercises: Supine   Neck Retraction Limitations cervical retractions with scapular retractions     Lumbar Exercises: Stretches   Passive Hamstring Stretch Limitations seated edge of bed   Lower Trunk Rotation 5 reps;20 seconds   Piriformis Stretch Limitations gastroc stretch slant board     Lumbar Exercises: Aerobic   Stationary Bike nu step UE & LE L4 8 min                PT Education - 10/07/16 1157    Education provided Yes   Education Details exercise form/rationale, relaxation techniques, options for community exercise   Person(s) Educated Patient   Methods Explanation;Demonstration;Tactile cues;Verbal cues   Comprehension Verbalized understanding;Returned demonstration;Verbal cues required;Tactile cues required;Need further instruction          PT Short Term Goals - 10/05/16 1348      PT SHORT TERM GOAL #1   Title Pt will be independent in her initial HEP.    Time 3   Period Weeks   Status  New   Target Date 10/26/16           PT Long Term Goals - 10/05/16 1445      PT LONG TERM GOAL #1   Title pt will be independent in her HEP and progression.   Time 8   Period Weeks   Status New   Target Date 12/06/16     PT LONG TERM GOAL #2   Title Pt will improve her FOTO score from 60% limitation to </= 48% limitation in order to improve functioanl mobility.    Time 8   Period Weeks   Status New   Target Date 12/06/16     PT LONG TERM GOAL #3   Title Pt will improve bilateral hip strength to >/= 4+/5 in order to improve gait.    Baseline -4/5    Time 8   Period Weeks   Status New   Target Date 12/06/16     PT LONG TERM GOAL #4   Title Pt will report no neck pain with ADL's.   Baseline 3-4/10    Time 8   Period Weeks   Status New   Target Date 12/06/16     PT LONG TERM GOAL #5   Title Pt will improve cervical rotation by >/= 8 degrees both  directions in order to improve functional mobility    Time 8   Period Weeks   Status New   Target Date 12/06/16               Plan - 10/07/16 1232    Clinical Impression Statement Special focus in treatment today was the idea of general relaxation to improve movement quality and decrease stress. We discussed placing sticky notes around her house to remind her to take deep breaths and decrease stress levels.    PT Treatment/Interventions ADLs/Self Care Home Management;Cryotherapy;Electrical Stimulation;Moist Heat;Ultrasound;Cognitive remediation;Therapeutic exercise;Therapeutic activities;Functional mobility training;Stair training;Gait training;Patient/family education;Manual techniques;Passive range of motion;Taping;Dry needling   PT Next Visit Plan Nustep, lumbar stretching, strengthening, cervical stretching, cervical isometrics, review HEP   PT Home Exercise Plan cervical retraction, uppper trap stretch, levator stretch, hamstring stretch, SKTC; gastroc stretch, hamstring stretch, shrugs, scapular retraction, LTR;    Consulted and Agree with Plan of Care Patient      Patient will benefit from skilled therapeutic intervention in order to improve the following deficits and impairments:  Abnormal gait, Postural dysfunction, Impaired flexibility, Decreased range of motion, Improper body mechanics, Difficulty walking, Decreased mobility, Decreased balance, Decreased activity tolerance, Decreased strength, Pain  Visit Diagnosis: Chronic left-sided low back pain without sciatica  Difficulty in walking, not elsewhere classified  Muscle weakness (generalized)  Neck pain     Problem List Patient Active Problem List   Diagnosis Date Noted  . Generalized osteoarthritis of multiple sites 07/16/2016  . Low back pain at multiple sites 07/16/2016  . Cervicalgia 07/16/2016  . Chronic pain disorder 07/16/2016  . COPD (chronic obstructive pulmonary disease) with chronic bronchitis (HCC)  04/11/2016  . 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 COPD (chronic obstructive pulmonary disease) (HCC) 02/17/2016  . Obesity due to excess calories 02/17/2016  . Unspecified constipation 09/22/2011  . HYPERLIPIDEMIA 08/27/2009  . DEPRESSION 08/27/2009  . Essential hypertension 08/27/2009  . Sleep apnea 08/27/2009  . DYSPNEA 08/27/2009  . BARRETTS ESOPHAGUS 08/07/2009  . ESOPHAGEAL REFLUX 08/07/2008  . COUGH 08/07/2008  . DIVERTICULOSIS OF COLON 11/17/2007  Jernee Murtaugh C. Tiziana Cislo PT, DPT 10/07/16 12:42 PM   [facility] [address] [place], [place], [address] Phone: [phone]   Fax:  [phone]  Name: Kylie Wilson MRN: [id number] Date of Birth: 09/30/1950

## 2016-10-08 ENCOUNTER — Ambulatory Visit: Payer: Medicare Other | Admitting: Physical Medicine & Rehabilitation

## 2016-10-11 ENCOUNTER — Encounter: Payer: Medicare Other | Admitting: Physical Therapy

## 2016-10-11 ENCOUNTER — Ambulatory Visit: Payer: Medicare Other | Admitting: Registered"

## 2016-10-13 ENCOUNTER — Ambulatory Visit: Payer: Medicare Other | Attending: Physical Medicine & Rehabilitation | Admitting: Physical Therapy

## 2016-10-13 ENCOUNTER — Encounter: Payer: Self-pay | Admitting: Physical Therapy

## 2016-10-13 DIAGNOSIS — M545 Low back pain, unspecified: Secondary | ICD-10-CM

## 2016-10-13 DIAGNOSIS — M542 Cervicalgia: Secondary | ICD-10-CM | POA: Insufficient documentation

## 2016-10-13 DIAGNOSIS — R262 Difficulty in walking, not elsewhere classified: Secondary | ICD-10-CM | POA: Insufficient documentation

## 2016-10-13 DIAGNOSIS — M6281 Muscle weakness (generalized): Secondary | ICD-10-CM | POA: Diagnosis not present

## 2016-10-13 DIAGNOSIS — I10 Essential (primary) hypertension: Secondary | ICD-10-CM | POA: Diagnosis not present

## 2016-10-13 DIAGNOSIS — Z8639 Personal history of other endocrine, nutritional and metabolic disease: Secondary | ICD-10-CM | POA: Diagnosis not present

## 2016-10-13 DIAGNOSIS — E782 Mixed hyperlipidemia: Secondary | ICD-10-CM | POA: Diagnosis not present

## 2016-10-13 DIAGNOSIS — G8929 Other chronic pain: Secondary | ICD-10-CM | POA: Insufficient documentation

## 2016-10-13 DIAGNOSIS — Z7689 Persons encountering health services in other specified circumstances: Secondary | ICD-10-CM | POA: Diagnosis not present

## 2016-10-13 DIAGNOSIS — Z2821 Immunization not carried out because of patient refusal: Secondary | ICD-10-CM | POA: Insufficient documentation

## 2016-10-13 DIAGNOSIS — Z1159 Encounter for screening for other viral diseases: Secondary | ICD-10-CM | POA: Diagnosis not present

## 2016-10-13 DIAGNOSIS — E113293 Type 2 diabetes mellitus with mild nonproliferative diabetic retinopathy without macular edema, bilateral: Secondary | ICD-10-CM | POA: Diagnosis not present

## 2016-10-13 NOTE — Patient Instructions (Signed)
Decompression series issued from Exercise drawer.  All issued Daily 5 x 5 seconds for all except decompression pose - 5-15 minutes.

## 2016-10-13 NOTE — Therapy (Signed)
[facility] [address] [place], [place], [address] Phone: [phone]   Fax:  [phone]  Physical Therapy Treatment  Patient Details  Name: Kylie HarmanJanice R Zegarra MRN: [id number] Date of Birth: 01/08/1951 Referring Provider: Maryla MorrowAnkit Patel, MD  Encounter Date: 10/13/2016      PT End of Session - 10/13/16 1219    Visit Number 3   Number of Visits 16   Date for PT Re-Evaluation 12/06/16   PT Start Time 1027   PT Stop Time 1106   PT Time Calculation (min) 39 min   Activity Tolerance Patient limited by fatigue   Behavior During Therapy [facility] for tasks assessed/performed      Past Medical History:  Diagnosis Date  . Arthritis   . Barrett's esophagus   . Diabetes mellitus   . GERD (gastroesophageal reflux disease)   . Hyperlipidemia   . Hypertension   . Sleep apnea    does not tolerate cpap    Past Surgical History:  Procedure Laterality Date  . BREAST BIOPSY  1980   left  . BREAST EXCISIONAL BIOPSY Left    Bening 1980's  . SCAR REVISION  2001   both legs  . TOE SURGERY  1981   4th and 5th toe right foot  . TOE SURGERY  2002   great toe left    There were no vitals filed for this visit.      Subjective Assessment - 10/13/16 1028    Subjective 3/10 now,  This morning 5/10.  Needed to use cane last night due to entertaining. pain has not been 10/10 in about a week.    Patient Stated Goals I want to stop hurting, I need to get back to being active, pt reporting her husband passed in December and she has decreased her activity level and wants to get back to what she was, "I want to get back to work"   Pain Score 5   up to 7/10   Pain Location Back   Pain Orientation Lower   Pain Descriptors / Indicators Sharp;Dull  in between   Pain Type Chronic pain   Pain Frequency Intermittent   Aggravating Factors  longer standing, entertaining  66 yr old granddaughter.   Pain Relieving Factors sitting laying,  ibuprophen   Multiple Pain Sites --  shoulders, neck, hips and right ankle pain.                           [facility] Adult PT Treatment/Exercise - 10/13/16 0001      Neck Exercises: Supine   Other Supine Exercise Decompression,  shoulder press,  head press,   Leg lengthener, leg press  ,  pillow under legs  5 X 5 seconds,  1 pillow under legs     Lumbar Exercises: Stretches   Passive Hamstring Stretch 3 reps;30 seconds   Passive Hamstring Stretch Limitations each  some retrograde soft tissue work, thighs, during stretch      Lumbar Exercises: Standing   Functional Squats 3 seconds   Functional Squats Limitations cues for getting things off chair,  heavy cues needed initially                PT Education - 10/13/16 1214    Education provided Yes   Education Details HEP,  lifting, reaching technique   Person(s) Educated Patient   Methods Explanation;Demonstration;Verbal cues;Handout   Comprehension Verbalized understanding;Returned demonstration  PT Short Term Goals - 10/05/16 1348      PT SHORT TERM GOAL #1   Title Pt will be independent in her initial HEP.    Time 3   Period Weeks   Status New   Target Date 10/26/16           PT Long Term Goals - 10/05/16 1445      PT LONG TERM GOAL #1   Title pt will be independent in her HEP and progression.   Time 8   Period Weeks   Status New   Target Date 12/06/16     PT LONG TERM GOAL #2   Title Pt will improve her FOTO score from 60% limitation to </= 48% limitation in order to improve functioanl mobility.    Time 8   Period Weeks   Status New   Target Date 12/06/16     PT LONG TERM GOAL #3   Title Pt will improve bilateral hip strength to >/= 4+/5 in order to improve gait.    Baseline -4/5    Time 8   Period Weeks   Status New   Target Date 12/06/16     PT LONG TERM GOAL #4   Title Pt will report no neck pain with ADL's.   Baseline 3-4/10    Time 8   Period Weeks   Status New   Target  Date 12/06/16     PT LONG TERM GOAL #5   Title Pt will improve cervical rotation by >/= 8 degrees both directions in order to improve functional mobility    Time 8   Period Weeks   Status New   Target Date 12/06/16               Plan - 10/13/16 1220    Clinical Impression Statement Able to  progress HEP today, patient was able to relax with fewer cues today and was able to relax enough to laugh a few times.   She has less pain at end of session.  She was able to demo small squat technique to be able to lift things correctly,  Needs more education for this when she is ready.    PT Next Visit Plan Nustep, lumbar stretching, strengthening, cervical stretching, cervical isometrics, review HEP   PT Home Exercise Plan cervical retraction, uppper trap stretch, levator stretch, hamstring stretch, SKTC; gastroc stretch, hamstring stretch, shrugs, scapular retraction, LTR; Decompression   Consulted and Agree with Plan of Care Patient      Patient will benefit from skilled therapeutic intervention in order to improve the following deficits and impairments:  Abnormal gait, Postural dysfunction, Impaired flexibility, Decreased range of motion, Improper body mechanics, Difficulty walking, Decreased mobility, Decreased balance, Decreased activity tolerance, Decreased strength, Pain  Visit Diagnosis: Chronic left-sided low back pain without sciatica  Difficulty in walking, not elsewhere classified  Muscle weakness (generalized)  Neck pain     Problem List Patient Active Problem List   Diagnosis Date Noted  . Generalized osteoarthritis of multiple sites 07/16/2016  . Low back pain at multiple sites 07/16/2016  . Cervicalgia 07/16/2016  . Chronic pain disorder 07/16/2016  . COPD (chronic obstructive pulmonary disease) with chronic bronchitis (HCC) 04/11/2016  . 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 COPD (chronic obstructive pulmonary disease) (HCC) 02/17/2016  . Obesity due to excess calories 02/17/2016  . Unspecified constipation 09/22/2011  . HYPERLIPIDEMIA  08/27/2009  . DEPRESSION 08/27/2009  . Essential hypertension 08/27/2009  . Sleep apnea 08/27/2009  . DYSPNEA 08/27/2009  . BARRETTS ESOPHAGUS 08/07/2009  . ESOPHAGEAL REFLUX 08/07/2008  . COUGH 08/07/2008  . DIVERTICULOSIS OF COLON 11/17/2007    HARRIS,KAREN PTA 10/13/2016, 12:27 PM  [facility] [address] [place], [place], [address] Phone: [phone]   Fax:  [phone]  Name: ANNMARGARET DECAPRIO MRN: [id number] Date of Birth: 1951-02-04

## 2016-10-15 DIAGNOSIS — E559 Vitamin D deficiency, unspecified: Secondary | ICD-10-CM | POA: Insufficient documentation

## 2016-10-18 ENCOUNTER — Other Ambulatory Visit: Payer: Self-pay | Admitting: Family Medicine

## 2016-10-18 DIAGNOSIS — E2839 Other primary ovarian failure: Secondary | ICD-10-CM

## 2016-10-19 ENCOUNTER — Telehealth: Payer: Self-pay | Admitting: Gastroenterology

## 2016-10-19 NOTE — Telephone Encounter (Signed)
Former Dr.Kaplan patient wishing to return to office due to being referred to other office and that doctor leaving.

## 2016-10-19 NOTE — Telephone Encounter (Signed)
[facility]. Patient states she has a polyp at 50 cm in large intestine. Patient requesting to see Dr.Danis. Records placed on desk for review.

## 2016-10-20 ENCOUNTER — Encounter: Payer: Self-pay | Admitting: Gastroenterology

## 2016-10-20 ENCOUNTER — Ambulatory Visit: Payer: Medicare Other | Admitting: Physical Therapy

## 2016-10-20 ENCOUNTER — Encounter: Payer: Self-pay | Admitting: Physical Therapy

## 2016-10-20 VITALS — BP 159/81 | HR 69

## 2016-10-20 DIAGNOSIS — M545 Low back pain, unspecified: Secondary | ICD-10-CM

## 2016-10-20 DIAGNOSIS — G8929 Other chronic pain: Secondary | ICD-10-CM

## 2016-10-20 DIAGNOSIS — M6281 Muscle weakness (generalized): Secondary | ICD-10-CM

## 2016-10-20 DIAGNOSIS — M542 Cervicalgia: Secondary | ICD-10-CM | POA: Diagnosis not present

## 2016-10-20 DIAGNOSIS — R262 Difficulty in walking, not elsewhere classified: Secondary | ICD-10-CM | POA: Diagnosis not present

## 2016-10-20 NOTE — Telephone Encounter (Signed)
Dr. Myrtie Neitheranis reviewed records and has accepted patient. OV scheduled.

## 2016-10-20 NOTE — Patient Instructions (Addendum)
Referred to MD for Medication discussion for BP.  ( She was going to stop taking her medication)  Row sitting  Issued from exercise drawer, 1-2 x a day 10-20 x each, green band Do sitting.

## 2016-10-20 NOTE — Therapy (Signed)
[facility] [address] [place], [place], [address] Phone: [phone]   Fax:  [phone]  Physical Therapy Treatment  Patient Details  Name: Kylie Wilson MRN: [id number] Date of Birth: 09-12-1950 Referring Provider: Maryla Morrow, MD  Encounter Date: 10/20/2016      PT End of Session - 10/20/16 1829    Visit Number 4   Number of Visits 16   Date for PT Re-Evaluation 12/06/16   PT Start Time 1416   PT Stop Time 1500   PT Time Calculation (min) 44 min   Activity Tolerance Patient limited by lethargy   Behavior During Therapy [facility] for tasks assessed/performed      Past Medical History:  Diagnosis Date  . Arthritis   . Barrett's esophagus   . Diabetes mellitus   . GERD (gastroesophageal reflux disease)   . Hyperlipidemia   . Hypertension   . Sleep apnea    does not tolerate cpap    Past Surgical History:  Procedure Laterality Date  . BREAST BIOPSY  1980   left  . BREAST EXCISIONAL BIOPSY Left    Bening 1980's  . SCAR REVISION  2001   both legs  . TOE SURGERY  1981   4th and 5th toe right foot  . TOE SURGERY  2002   great toe left    Vitals:   10/20/16 1824  BP: (!) 159/81  Pulse: 69        Subjective Assessment - 10/20/16 1824    Subjective I am motivated to get better.  2/10 pain now.  This morning it was a 4/10. I am able to exercise some st home.  i don't do the laying down exercises as much as I need .  Neck is moving better with less pain.   Currently in Pain? Yes   Pain Score 2   4/10 in am   Pain Location Back   Pain Orientation Lower   Pain Descriptors / Indicators Sharp;Dull   Pain Type Chronic pain   Pain Frequency Intermittent   Aggravating Factors  weather, longe r standing    Pain Relieving Factors sitting, ibuprophen   Effect of Pain on Daily Activities difficulty sitting at computer                         [facility] Adult PT Treatment/Exercise - 10/20/16 0001      Neck Exercises: Standing   Other Standing Exercises Row , green band 2 set of 10 HEP,  she needs to do sitting for balance safety.     Neck Exercises: Supine   Other Supine Exercise neck stabilization 2 exercises 10 x each.  cued for techniques initially.  Head press with flexion/extension, circles and horizontal bduction / adduction,  ( smaller ROM)   Other Supine Exercise Decompression,  shoulder press,  head press,   Leg lengthener, leg press  ,  no leg pillow needed  5 X 5 seconds,  1 pillow under legs                PT Education - 10/20/16 1833    Education provided Yes   Education Details HEP   Person(s) Educated Patient   Methods Explanation;Demonstration;Tactile cues;Verbal cues;Handout   Comprehension Verbalized understanding;Returned demonstration          PT Short Term Goals - 10/05/16 1348      PT SHORT TERM GOAL #1   Title Pt will be independent in  her initial HEP.    Time 3   Period Weeks   Status New   Target Date 10/26/16           PT Long Term Goals - 10/05/16 1445      PT LONG TERM GOAL #1   Title pt will be independent in her HEP and progression.   Time 8   Period Weeks   Status New   Target Date 12/06/16     PT LONG TERM GOAL #2   Title Pt will improve her FOTO score from 60% limitation to </= 48% limitation in order to improve functioanl mobility.    Time 8   Period Weeks   Status New   Target Date 12/06/16     PT LONG TERM GOAL #3   Title Pt will improve bilateral hip strength to >/= 4+/5 in order to improve gait.    Baseline -4/5    Time 8   Period Weeks   Status New   Target Date 12/06/16     PT LONG TERM GOAL #4   Title Pt will report no neck pain with ADL's.   Baseline 3-4/10    Time 8   Period Weeks   Status New   Target Date 12/06/16     PT LONG TERM GOAL #5   Title Pt will improve cervical rotation by >/= 8 degrees both directions in order to improve functional mobility    Time 8   Period Weeks   Status New    Target Date 12/06/16               Plan - 10/20/16 1833    Clinical Impression Statement Able to progress her HEP today.  Neck pain improving with ADL's.    PT Treatment/Interventions ADLs/Self Care Home Management;Cryotherapy;Electrical Stimulation;Moist Heat;Ultrasound;Cognitive remediation;Therapeutic exercise;Therapeutic activities;Functional mobility training;Stair training;Gait training;Patient/family education;Manual techniques;Passive range of motion;Taping;Dry needling   PT Next Visit Plan Nustep, lumbar stretching, strengthening, cervical stretching, cervical isometrics, review HEP   PT Home Exercise Plan cervical retraction, uppper trap stretch, levator stretch, hamstring stretch, SKTC; gastroc stretch, hamstring stretch, shrugs, scapular retraction, LTR; Decompression,  ROW   Consulted and Agree with Plan of Care Patient      Patient will benefit from skilled therapeutic intervention in order to improve the following deficits and impairments:     Visit Diagnosis: Chronic left-sided low back pain without sciatica  Difficulty in walking, not elsewhere classified  Muscle weakness (generalized)  Neck pain     Problem List Patient Active Problem List   Diagnosis Date Noted  . Generalized osteoarthritis of multiple sites 07/16/2016  . Low back pain at multiple sites 07/16/2016  . Cervicalgia 07/16/2016  . Chronic pain disorder 07/16/2016  . COPD (chronic obstructive pulmonary disease) with chronic bronchitis (HCC) 04/11/2016  . 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 COPD (chronic obstructive pulmonary disease) (HCC) 02/17/2016  . Obesity due to excess calories 02/17/2016  . Unspecified constipation 09/22/2011  . HYPERLIPIDEMIA 08/27/2009  . DEPRESSION 08/27/2009  . Essential hypertension 08/27/2009  . Sleep apnea 08/27/2009  . DYSPNEA 08/27/2009  . BARRETTS  ESOPHAGUS 08/07/2009  . ESOPHAGEAL REFLUX 08/07/2008  . COUGH 08/07/2008  . DIVERTICULOSIS OF COLON 11/17/2007    Zahria Ding PTA 10/20/2016, 6:35 PM  [facility] [address] [place], [place], [address] Phone: [phone]   Fax:  [phone]  Name: Kylie Wilson  MRN: [id number] Date of Birth: Sep 09, 1950

## 2016-10-22 ENCOUNTER — Encounter: Payer: Self-pay | Admitting: Physical Therapy

## 2016-10-22 ENCOUNTER — Ambulatory Visit: Payer: Medicare Other | Admitting: Physical Therapy

## 2016-10-22 VITALS — BP 138/80

## 2016-10-22 DIAGNOSIS — G8929 Other chronic pain: Secondary | ICD-10-CM

## 2016-10-22 DIAGNOSIS — R262 Difficulty in walking, not elsewhere classified: Secondary | ICD-10-CM

## 2016-10-22 DIAGNOSIS — M545 Low back pain, unspecified: Secondary | ICD-10-CM

## 2016-10-22 DIAGNOSIS — M6281 Muscle weakness (generalized): Secondary | ICD-10-CM | POA: Diagnosis not present

## 2016-10-22 DIAGNOSIS — M542 Cervicalgia: Secondary | ICD-10-CM

## 2016-10-22 NOTE — Therapy (Signed)
[facility] [address] [place], [place], [address] Phone: [phone]   Fax:  [phone]  Physical Therapy Treatment  Patient Details  Name: Kylie HarmanJanice R Garno MRN: [id number] Date of Birth: 01/04/1951 Referring Provider: Maryla MorrowAnkit Patel, MD  Encounter Date: 10/22/2016      PT End of Session - 10/22/16 0850    Visit Number 5   Number of Visits 16   Date for PT Re-Evaluation 12/06/16   Authorization Type UHC/Medicare, KX modifier at visit 15, G-codes every 10th visit   PT Start Time 0850  pt arrived late   PT Stop Time 0920  pt requested to end treatment- not feeling well   PT Time Calculation (min) 30 min   Activity Tolerance Patient tolerated treatment well   Behavior During Therapy [facility] for tasks assessed/performed      Past Medical History:  Diagnosis Date  . Arthritis   . Barrett's esophagus   . Diabetes mellitus   . GERD (gastroesophageal reflux disease)   . Hyperlipidemia   . Hypertension   . Sleep apnea    does not tolerate cpap    Past Surgical History:  Procedure Laterality Date  . BREAST BIOPSY  1980   left  . BREAST EXCISIONAL BIOPSY Left    Bening 1980's  . SCAR REVISION  2001   both legs  . TOE SURGERY  1981   4th and 5th toe right foot  . TOE SURGERY  2002   great toe left    Vitals:   10/22/16 0857  BP: 138/80        Subjective Assessment - 10/22/16 0850    Subjective Not feeling very well today, did not eat right. 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 Neck is feeling much better. Has been having some discomfort in L knee   Currently in Pain? Yes   Pain Score 2    Pain Location Back   Pain Orientation Lower                         [facility] Adult PT Treatment/Exercise - 10/22/16 0001      Neck Exercises: Standing   Other Standing Exercises standing row with 2# hand weights   Other Standing Exercises biceps curls, overhead press  2#     Lumbar Exercises: Aerobic   Stationary Bike nu step 11 min L5     Lumbar Exercises: Machines for Strengthening   Leg Press horizontal leg press body weight +1 plate                PT Education - 10/22/16 0857    Education provided Yes   Education Details exercise form/rationale   Person(s) Educated Patient   Methods Explanation;Demonstration;Tactile cues;Verbal cues   Comprehension Verbalized understanding;Returned demonstration;Verbal cues required;Tactile cues required;Need further instruction          PT Short Term Goals - 10/05/16 1348      PT SHORT TERM GOAL #1   Title Pt will be independent in her initial HEP.    Time 3   Period Weeks   Status New   Target Date 10/26/16           PT Long Term Goals - 10/05/16 1445      PT LONG TERM GOAL #1   Title pt will be independent in her HEP and progression.   Time 8   Period Weeks   Status New  Target Date 12/06/16     PT LONG TERM GOAL #2   Title Pt will improve her FOTO score from 60% limitation to </= 48% limitation in order to improve functioanl mobility.    Time 8   Period Weeks   Status New   Target Date 12/06/16     PT LONG TERM GOAL #3   Title Pt will improve bilateral hip strength to >/= 4+/5 in order to improve gait.    Baseline -4/5    Time 8   Period Weeks   Status New   Target Date 12/06/16     PT LONG TERM GOAL #4   Title Pt will report no neck pain with ADL's.   Baseline 3-4/10    Time 8   Period Weeks   Status New   Target Date 12/06/16     PT LONG TERM GOAL #5   Title Pt will improve cervical rotation by >/= 8 degrees both directions in order to improve functional mobility    Time 8   Period Weeks   Status New   Target Date 12/06/16               Plan - 10/22/16 0920    Clinical Impression Statement Pt fatigued with exercise, discussed life stressors and return to a gym around school. Pt requested to end treatment early due to not feeling well.    PT  Treatment/Interventions ADLs/Self Care Home Management;Cryotherapy;Electrical Stimulation;Moist Heat;Ultrasound;Cognitive remediation;Therapeutic exercise;Therapeutic activities;Functional mobility training;Stair training;Gait training;Patient/family education;Manual techniques;Passive range of motion;Taping;Dry needling   PT Next Visit Plan nu step, challenge in standing for postural engagement   PT Home Exercise Plan cervical retraction, uppper trap stretch, levator stretch, hamstring stretch, SKTC; gastroc stretch, hamstring stretch, shrugs, scapular retraction, LTR; Decompression,  ROW   Consulted and Agree with Plan of Care Patient      Patient will benefit from skilled therapeutic intervention in order to improve the following deficits and impairments:  Abnormal gait, Postural dysfunction, Impaired flexibility, Decreased range of motion, Improper body mechanics, Difficulty walking, Decreased mobility, Decreased balance, Decreased activity tolerance, Decreased strength, Pain  Visit Diagnosis: Chronic left-sided low back pain without sciatica  Difficulty in walking, not elsewhere classified  Muscle weakness (generalized)  Neck pain     Problem List Patient Active Problem List   Diagnosis Date Noted  . Generalized osteoarthritis of multiple sites 07/16/2016  . Low back pain at multiple sites 07/16/2016  . Cervicalgia 07/16/2016  . Chronic pain disorder 07/16/2016  . COPD (chronic obstructive pulmonary disease) with chronic bronchitis (HCC) 04/11/2016  . 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 COPD (chronic obstructive pulmonary disease) (HCC) 02/17/2016  . Obesity due to excess calories 02/17/2016  . Unspecified constipation 09/22/2011  . HYPERLIPIDEMIA 08/27/2009  . DEPRESSION 08/27/2009  . Essential hypertension 08/27/2009  . Sleep apnea 08/27/2009  . DYSPNEA 08/27/2009  .  BARRETTS ESOPHAGUS 08/07/2009  . ESOPHAGEAL REFLUX 08/07/2008  . COUGH 08/07/2008  . DIVERTICULOSIS OF COLON 11/17/2007    Kylie [place] C. Kendale Rembold PT, DPT 10/22/16 9:22 AM   [facility] [address] [place], [place], [address] Phone: [phone]   Fax:  [phone]  Name: VEGA STARE MRN: [id number] Date of Birth: 04/07/1950

## 2016-10-26 ENCOUNTER — Encounter: Payer: Self-pay | Admitting: Physical Therapy

## 2016-10-26 ENCOUNTER — Ambulatory Visit: Payer: Medicare Other | Admitting: Physical Therapy

## 2016-10-26 DIAGNOSIS — M542 Cervicalgia: Secondary | ICD-10-CM

## 2016-10-26 DIAGNOSIS — M6281 Muscle weakness (generalized): Secondary | ICD-10-CM

## 2016-10-26 DIAGNOSIS — R262 Difficulty in walking, not elsewhere classified: Secondary | ICD-10-CM | POA: Diagnosis not present

## 2016-10-26 DIAGNOSIS — M545 Low back pain, unspecified: Secondary | ICD-10-CM

## 2016-10-26 DIAGNOSIS — G8929 Other chronic pain: Secondary | ICD-10-CM

## 2016-10-26 NOTE — Therapy (Signed)
[facility] [address] [place], [place], [address] Phone: [phone]   Fax:  [phone]  Physical Therapy Treatment  Patient Details  Name: Kylie Wilson MRN: [id number] Date of Birth: 04/03/1950 Referring Provider: Maryla Morrow, MD  Encounter Date: 10/26/2016    Past Medical History:  Diagnosis Date  . Arthritis   . Barrett's esophagus   . Diabetes mellitus   . GERD (gastroesophageal reflux disease)   . Hyperlipidemia   . Hypertension   . Sleep apnea    does not tolerate cpap    Past Surgical History:  Procedure Laterality Date  . BREAST BIOPSY  1980   left  . BREAST EXCISIONAL BIOPSY Left    Bening 1980's  . SCAR REVISION  2001   both legs  . TOE SURGERY  1981   4th and 5th toe right foot  . TOE SURGERY  2002   great toe left    There were no vitals filed for this visit.      Subjective Assessment - 10/26/16 1425    Subjective No back pain. It has been a couple of days since she has back pain (Last Thursday)  Neck worse with using computer.(Tolerates 1 hour)  2/10.  I was sitting with rows with red.  I am doing the exercises.       Currently in Pain? No/denies   Pain Location Neck   Pain Orientation Lower   Pain Descriptors / Indicators Tightness   Pain Type Chronic pain   Pain Frequency Intermittent   Aggravating Factors  longer walking, sitting at computer   Pain Relieving Factors sitting,  ibuprophen   Effect of Pain on Daily Activities sitting at computer            [facility] PT Assessment - 10/26/16 0001      AROM   Cervical - Right Rotation 50   Cervical - Left Rotation 49                     [facility] Adult PT Treatment/Exercise - 10/26/16 0001      Neck Exercises: Seated   Neck Retraction 5 reps  2 sets   Cervical Rotation 5 reps   Lateral Flexion 5 reps   Other Seated Exercise ER yellow band 10 X small motions for right   scapular rows 10 x 2 sets band   Other Seated  Exercise scapular retraction 5 X     Lumbar Exercises: Stretches   Passive Hamstring Stretch 3 reps;30 seconds     Lumbar Exercises: Aerobic   Stationary Bike Nu step 8 minutes     Lumbar Exercises: Standing   Other Standing Lumbar Exercises home walking program discussed.  She does not feel ready.  she will walk some inside to build strength.     Other Standing Lumbar Exercises patient able to simulate jogging brief period,  (her idea)      Lumbar Exercises: Seated   Sit to Stand 5 reps  cued     Neck Exercises: Stretches   Upper Trapezius Stretch 10 seconds;2 reps   Levator Stretch 10 seconds;2 reps   Other Neck Stretches chin to chest stretch 2 X 10 seconds                  PT Short Term Goals - 10/26/16 1821      PT SHORT TERM GOAL #1   Title Pt will be independent in her initial HEP.  Baseline independent with exercises issued so far.   Time 3   Period Weeks   Status On-going           PT Long Term Goals - 10/05/16 1445      PT LONG TERM GOAL #1   Title pt will be independent in her HEP and progression.   Time 8   Period Weeks   Status New   Target Date 12/06/16     PT LONG TERM GOAL #2   Title Pt will improve her FOTO score from 60% limitation to </= 48% limitation in order to improve functioanl mobility.    Time 8   Period Weeks   Status New   Target Date 12/06/16     PT LONG TERM GOAL #3   Title Pt will improve bilateral hip strength to >/= 4+/5 in order to improve gait.    Baseline -4/5    Time 8   Period Weeks   Status New   Target Date 12/06/16     PT LONG TERM GOAL #4   Title Pt will report no neck pain with ADL's.   Baseline 3-4/10    Time 8   Period Weeks   Status New   Target Date 12/06/16     PT LONG TERM GOAL #5   Title Pt will improve cervical rotation by >/= 8 degrees both directions in order to improve functional mobility    Time 8   Period Weeks   Status New   Target Date 12/06/16               Plan  - 10/26/16 1821    Clinical Impression Statement Progress toward STG#1.  AROM rotation neck to right 50 , (Intake: 28) to left 49 ( intake: 22).  No back pain today.  No pain increase with session.  Patient does not feel ready to return to [facility] yet.     PT Treatment/Interventions ADLs/Self Care Home Management;Cryotherapy;Electrical Stimulation;Moist Heat;Ultrasound;Cognitive remediation;Therapeutic exercise;Therapeutic activities;Functional mobility training;Stair training;Gait training;Patient/family education;Manual techniques;Passive range of motion;Taping;Dry needling   PT Next Visit Plan nu step, challenge in standing for postural engagement   PT Home Exercise Plan cervical retraction, uppper trap stretch, levator stretch, hamstring stretch, SKTC; gastroc stretch, hamstring stretch, shrugs, scapular retraction, LTR; Decompression,  ROW   Consulted and Agree with Plan of Care Patient      Patient will benefit from skilled therapeutic intervention in order to improve the following deficits and impairments:  Abnormal gait, Postural dysfunction, Impaired flexibility, Decreased range of motion, Improper body mechanics, Difficulty walking, Decreased mobility, Decreased balance, Decreased activity tolerance, Decreased strength, Pain  Visit Diagnosis: Chronic left-sided low back pain without sciatica  Difficulty in walking, not elsewhere classified  Muscle weakness (generalized)  Neck pain     Problem List Patient Active Problem List   Diagnosis Date Noted  . Generalized osteoarthritis of multiple sites 07/16/2016  . Low back pain at multiple sites 07/16/2016  . Cervicalgia 07/16/2016  . Chronic pain disorder 07/16/2016  . COPD (chronic obstructive pulmonary disease) with chronic bronchitis (HCC) 04/11/2016  . 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 COPD (chronic obstructive  pulmonary disease) (HCC) 02/17/2016  . Obesity due to excess calories 02/17/2016  . Unspecified constipation 09/22/2011  . HYPERLIPIDEMIA 08/27/2009  . DEPRESSION 08/27/2009  . Essential hypertension 08/27/2009  . Sleep apnea 08/27/2009  . DYSPNEA 08/27/2009  . BARRETTS  ESOPHAGUS 08/07/2009  . ESOPHAGEAL REFLUX 08/07/2008  . COUGH 08/07/2008  . DIVERTICULOSIS OF COLON 11/17/2007    HARRIS,KAREN PTA 10/26/2016, 6:24 PM  [facility] [address] [place], [place], [address] Phone: [phone]   Fax:  [phone]  Name: Kylie Wilson MRN: [id number] Date of Birth: [birth year]

## 2016-10-28 ENCOUNTER — Ambulatory Visit: Payer: Medicare Other | Admitting: Cardiovascular Disease

## 2016-10-28 ENCOUNTER — Ambulatory Visit: Payer: Medicare Other | Admitting: Physical Therapy

## 2016-10-28 ENCOUNTER — Ambulatory Visit: Payer: Medicare Other | Admitting: Physical Medicine & Rehabilitation

## 2016-10-28 ENCOUNTER — Encounter: Payer: Self-pay | Admitting: Physical Therapy

## 2016-10-28 DIAGNOSIS — R262 Difficulty in walking, not elsewhere classified: Secondary | ICD-10-CM

## 2016-10-28 DIAGNOSIS — M542 Cervicalgia: Secondary | ICD-10-CM | POA: Diagnosis not present

## 2016-10-28 DIAGNOSIS — M545 Low back pain, unspecified: Secondary | ICD-10-CM

## 2016-10-28 DIAGNOSIS — M6281 Muscle weakness (generalized): Secondary | ICD-10-CM

## 2016-10-28 DIAGNOSIS — G8929 Other chronic pain: Secondary | ICD-10-CM | POA: Diagnosis not present

## 2016-10-28 NOTE — Therapy (Addendum)
[facility], [place], [address] Phone: [phone]   Fax:  [phone]  Physical Therapy Treatment/Discharge Summary  Patient Details  Name: Kylie Wilson MRN: [id number] Date of Birth: [birth year] Referring Provider: Delice Lesch, MD  Encounter Date: 10/28/2016      PT End of Session - 10/28/16 1757    Visit Number 6   Number of Visits 16   Date for PT Re-Evaluation 12/06/16   PT Start Time 0370   PT Stop Time 1630   PT Time Calculation (min) 45 min   Activity Tolerance Patient tolerated treatment well   Behavior During Therapy [facility] for tasks assessed/performed      Past Medical History:  Diagnosis Date  . Arthritis   . Barrett's esophagus   . Diabetes mellitus   . GERD (gastroesophageal reflux disease)   . Hyperlipidemia   . Hypertension   . Sleep apnea    does not tolerate cpap    Past Surgical History:  Procedure Laterality Date  . BREAST BIOPSY  1980   left  . BREAST EXCISIONAL BIOPSY Left    Bening 1980's  . SCAR REVISION  2001   both legs  . TOE SURGERY  1981   4th and 5th toe right foot  . TOE SURGERY  2002   great toe left    There were no vitals filed for this visit.      Subjective Assessment - 10/28/16 1550    Subjective 3/10 sore all over.  probably from new exercise    Currently in Pain? Yes   Pain Location Neck   Pain Orientation Lower;Posterior   Aggravating Factors  New exercises                         [facility] Adult PT Treatment/Exercise - 10/28/16 0001      High Level Balance   High Level Balance Comments dynamic stepping side to side, forward and back and box step with close SBA     Neck Exercises: Seated   Neck Retraction 5 reps   Cervical Rotation 5 reps   Other Seated Exercise ER yellow band 10 X small motions for right   scapular rows 10 x 2 sets band   Other Seated Exercise 10 X     Neck Exercises: Supine   Neck Retraction 5 reps    Other Supine Exercise Neck stabilization circles, protraction, Horizontal   4 LBS used each, cued to keep head firmly on pillow     Lumbar Exercises: Stretches   Passive Hamstring Stretch 3 reps;30 seconds   Passive Hamstring Stretch Limitations Both   Lower Trunk Rotation 5 reps   Lower Trunk Rotation Limitations 5 seconds     Lumbar Exercises: Aerobic   Stationary Bike Nu step 12 minutes     Lumbar Exercises: Standing   Side Lunge Limitations 4 sets od 2 steps with minisquat.     Other Standing Lumbar Exercises Ball press both 20 X, and single 10 X each     Lumbar Exercises: Supine   Bridge 10 reps                  PT Short Term Goals - 10/26/16 1821      PT SHORT TERM GOAL #1   Title Pt will be independent in her initial HEP.    Baseline independent with exercises issued so far.   Time 3   Period Weeks  Status On-going           PT Long Term Goals - 10/05/16 1445      PT LONG TERM GOAL #1   Title pt will be independent in her HEP and progression.   Time 8   Period Weeks   Status New   Target Date 12/06/16     PT LONG TERM GOAL #2   Title Pt will improve her FOTO score from 60% limitation to </= 48% limitation in order to improve functioanl mobility.    Time 8   Period Weeks   Status New   Target Date 12/06/16     PT LONG TERM GOAL #3   Title Pt will improve bilateral hip strength to >/= 4+/5 in order to improve gait.    Baseline -4/5    Time 8   Period Weeks   Status New   Target Date 12/06/16     PT LONG TERM GOAL #4   Title Pt will report no neck pain with ADL's.   Baseline 3-4/10    Time 8   Period Weeks   Status New   Target Date 12/06/16     PT LONG TERM GOAL #5   Title Pt will improve cervical rotation by >/= 8 degrees both directions in order to improve functional mobility    Time 8   Period Weeks   Status New   Target Date 12/06/16               Plan - 10/28/16 1758    Clinical Impression Statement  Strengthening focus today. No new objective findings. Patient put effort into her exercises today and worked up a sweat.    PT Treatment/Interventions ADLs/Self Care Home Management;Cryotherapy;Electrical Stimulation;Moist Heat;Ultrasound;Cognitive remediation;Therapeutic exercise;Therapeutic activities;Functional mobility training;Stair training;Gait training;Patient/family education;Manual techniques;Passive range of motion;Taping;Dry needling   PT Next Visit Plan nu step, challenge in standing for postural engagement,  check goals,   PT Home Exercise Plan cervical retraction, uppper trap stretch, levator stretch, hamstring stretch, SKTC; gastroc stretch, hamstring stretch, shrugs, scapular retraction, LTR; Decompression,  ROW   Consulted and Agree with Plan of Care Patient      Patient will benefit from skilled therapeutic intervention in order to improve the following deficits and impairments:  Abnormal gait, Postural dysfunction, Impaired flexibility, Decreased range of motion, Improper body mechanics, Difficulty walking, Decreased mobility, Decreased balance, Decreased activity tolerance, Decreased strength, Pain  Visit Diagnosis: Chronic left-sided low back pain without sciatica  Difficulty in walking, not elsewhere classified  Muscle weakness (generalized)  Neck pain     Problem List Patient Active Problem List   Diagnosis Date Noted  . Generalized osteoarthritis of multiple sites 07/16/2016  . Low back pain at multiple sites 07/16/2016  . Cervicalgia 07/16/2016  . Chronic pain disorder 07/16/2016  . COPD (chronic obstructive pulmonary disease) with chronic bronchitis ([place]) 04/11/2016  . 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 COPD (chronic obstructive pulmonary disease) ([place]) 02/17/2016  . Obesity due to excess calories 02/17/2016  . Unspecified constipation 09/22/2011  .  HYPERLIPIDEMIA 08/27/2009  . DEPRESSION 08/27/2009  . Essential hypertension 08/27/2009  . Sleep apnea 08/27/2009  . DYSPNEA 08/27/2009  . BARRETTS ESOPHAGUS 08/07/2009  . ESOPHAGEAL REFLUX 08/07/2008  . COUGH 08/07/2008  . DIVERTICULOSIS OF COLON 11/17/2007    HARRIS,KAREN PTA 10/28/2016, 6:03 PM  [facility] [address] [place], [place], [address]  Phone: [phone]   Fax:  [phone]  Name: Kylie Wilson MRN: [id number] Date of Birth: 14-Mar-1951   PHYSICAL THERAPY DISCHARGE SUMMARY  Visits from Start of Care: 6  Current functional level related to goals / functional outcomes: See above   Remaining deficits: See above   Education / Equipment: Anatomy of condition, POC, HEP, exercise form/rationale  Plan: Patient agrees to discharge.  Patient goals were not met. Patient is being discharged due to the patient's request.  ?????   Cancelled appointments for "personal reasons"   Jessica C. Hightower PT, DPT 11/17/16 11:39 AM

## 2016-11-02 ENCOUNTER — Encounter: Payer: Medicare Other | Admitting: Physical Therapy

## 2016-11-04 ENCOUNTER — Encounter: Payer: Medicare Other | Admitting: Physical Therapy

## 2016-11-08 ENCOUNTER — Other Ambulatory Visit: Payer: Self-pay | Admitting: Family Medicine

## 2016-11-09 ENCOUNTER — Encounter: Payer: Medicare Other | Admitting: Physical Therapy

## 2016-11-10 ENCOUNTER — Encounter: Payer: Medicare Other | Admitting: Physical Therapy

## 2016-11-11 ENCOUNTER — Encounter: Payer: Medicare Other | Admitting: Physical Therapy

## 2016-11-12 ENCOUNTER — Encounter: Payer: Medicare Other | Admitting: Physical Therapy

## 2016-11-16 ENCOUNTER — Encounter: Payer: Medicare Other | Admitting: Physical Therapy

## 2016-11-17 ENCOUNTER — Encounter: Payer: Medicare Other | Admitting: Physical Therapy

## 2016-11-18 ENCOUNTER — Encounter: Payer: Medicare Other | Admitting: Physical Therapy

## 2016-11-19 ENCOUNTER — Encounter: Payer: Medicare Other | Admitting: Physical Therapy

## 2016-11-23 ENCOUNTER — Encounter: Payer: Medicare Other | Admitting: Physical Therapy

## 2016-11-26 ENCOUNTER — Encounter: Payer: Medicare Other | Admitting: Physical Therapy

## 2016-11-28 ENCOUNTER — Other Ambulatory Visit: Payer: Self-pay | Admitting: Family Medicine

## 2016-11-28 DIAGNOSIS — I10 Essential (primary) hypertension: Secondary | ICD-10-CM

## 2016-12-02 ENCOUNTER — Encounter: Payer: Self-pay | Admitting: Family Medicine

## 2016-12-14 ENCOUNTER — Ambulatory Visit (INDEPENDENT_AMBULATORY_CARE_PROVIDER_SITE_OTHER): Payer: Medicare Other | Admitting: Gastroenterology

## 2016-12-14 ENCOUNTER — Encounter: Payer: Self-pay | Admitting: Gastroenterology

## 2016-12-14 VITALS — BP 128/70 | HR 68 | Ht 63.0 in | Wt 265.2 lb

## 2016-12-14 DIAGNOSIS — K2270 Barrett's esophagus without dysplasia: Secondary | ICD-10-CM | POA: Diagnosis not present

## 2016-12-14 DIAGNOSIS — K5900 Constipation, unspecified: Secondary | ICD-10-CM

## 2016-12-14 DIAGNOSIS — M6208 Separation of muscle (nontraumatic), other site: Secondary | ICD-10-CM | POA: Diagnosis not present

## 2016-12-14 DIAGNOSIS — R1031 Right lower quadrant pain: Secondary | ICD-10-CM

## 2016-12-14 NOTE — Progress Notes (Signed)
[facility] Consult Note:  History: Kylie Wilson 12/14/2016  Referring physician: Verlon Au, MD  Reason for consult/chief complaint: Abdominal Pain (RLQ pain chronic, ) and Constipation (Bowel move with laxative)   Subjective  HPI:  This is a 66 year old woman who requested another opinion on her abdominal pain and constipation. She saw Dr. Arlyce Dice for this in July 2013. He describe right lower quadrant pain that seemed relieved with bowel movements. She then sought care with the [facility] because her primary care is in [facility]. When that physician left earlier this year, she requested to return to our office. She's had chronic constipation for at least 15 years. She takes both [facility] lax and MiraLAX but still has to strain at times. She called being told by one physician that perhaps she had uterine prolapse, but says she was evaluated by gynecology and found that she did not. She was concerned because she had a "large precancerous polyp" on her last exam with the [facility], and thought she might be overdue for an exam. She is bothered by a protuberance or bulge in the mid upper abdomen that is more pronounced when she stands. She also describes abdominal bloating, and after doing some reading on line was concerned she might have "leaky gut". [place] also has Barrett's esophagus which had been followed by Dr. Arlyce Dice and also her most recent GI practice. She continues to complain of right lower quadrant pain that is nonradiating and sometimes gets better after bowel movement.  Review of records from the [facility] indicating a 4 mm tubular adenoma in May 2014, with recommendations for a recall at a 5 year interval. Her last upper endoscopy showed Barrett's esophagus with no dysplasia in October 2016.   ROS:  Review of Systems  Constitutional: Negative for appetite change and unexpected weight change.  HENT: Negative  for mouth sores and voice change.   Eyes: Negative for pain and redness.  Respiratory: Negative for cough and shortness of breath.   Cardiovascular: Negative for chest pain and palpitations.  Genitourinary: Negative for dysuria and hematuria.  Musculoskeletal: Negative for arthralgias and myalgias.  Skin: Negative for pallor and rash.  Neurological: Negative for weakness and headaches.  Hematological: Negative for adenopathy.   Denies dysphagia  Past Medical History: Past Medical History:  Diagnosis Date  . Arthritis   . Barrett's esophagus   . Cervical radiculopathy   . Cervical spondylosis   . Chronic constipation   . DDD (degenerative disc disease), cervical   . Depression   . Diabetes mellitus    type 2   . GERD (gastroesophageal reflux disease)   . Hyperlipidemia   . Hypertension   . Obesity   . Rectocele   . Sleep apnea    does not tolerate cpap  . Uterine prolapse      Past Surgical History: Past Surgical History:  Procedure Laterality Date  . BREAST BIOPSY  1980   left  . BREAST EXCISIONAL BIOPSY Left    Bening 1980's  . SCAR REVISION  2001   both legs  . TOE SURGERY  1981   4th and 5th toe right foot  . TOE SURGERY  2002   great toe left  . TONSILLECTOMY    . TUBAL LIGATION       Family History: Family History  Problem Relation Age of Onset  . Heart disease Father   . Pancreatic cancer Father   . Breast cancer Paternal Aunt   .  Stroke Mother   . Heart disease Brother   . Colon cancer Neg Hx   . Stomach cancer Neg Hx   . Colon polyps Neg Hx   . Rectal cancer Neg Hx     Social History: Social History   Social History  . Marital status: Widowed    Spouse name: N/A  . Number of children: 3  . Years of education: N/A   Occupational History  . Retired Unemployed   Social History Main Topics  . Smoking status: Former Smoker    Packs/day: 0.80    Years: 30.00    Types: Cigarettes    Quit date: 09/15/2011  . Smokeless tobacco: Never  Used     Comment: smoked 20 years; then quit 10; smoked 30 years plus  . Alcohol use No  . Drug use: No  . Sexual activity: Not Asked   Other Topics Concern  . None   Social History Narrative  . None    Allergies: Allergies  Allergen Reactions  . Statins Other (See Comments)    Muscle cramps  . Contrast Media [Iodinated Diagnostic Agents]     Nauseated  . Lisinopril Other (See Comments)    cough    Outpatient Meds: Current Outpatient Prescriptions  Medication Sig Dispense Refill  . amLODipine (NORVASC) 10 MG tablet Take 1 tablet (10 mg total) by mouth daily. 90 tablet 1  . glucose blood (ACCU-CHEK AVIVA PLUS) test strip Use to test blood sugars daily. 100 each 3  . losartan (COZAAR) 25 MG tablet Take 1 tablet (25 mg total) by mouth daily. 90 tablet 1  . metFORMIN (GLUCOPHAGE) 500 MG tablet TAKE 1 TABLET BY MOUTH WITH BREAKFAST AND TWO TABLETS WITH SUPPER. 270 tablet 1  . Vitamin D, Ergocalciferol, (DRISDOL) 50000 units CAPS capsule Take 1 capsule by mouth once a week.  2   No current facility-administered medications for this visit.       ___________________________________________________________________ Objective   Exam:  BP 128/70 (Cuff Size: Large)   Pulse 68   Ht  (1.6 m)   Wt 265 lb 3.2 oz (120.3 kg)   BMI 46.98 kg/m    General: this is a(n) Morbidly obese woman, otherwise well-appearing   Eyes: sclera anicteric, no redness  ENT: oral mucosa moist without lesions, no cervical or supraclavicular lymphadenopathy, good dentition  CV: RRR without murmur, S1/S2, no JVD, no peripheral edema  Resp: clear to auscultation bilaterally, normal RR and effort noted  GI: soft, mild mid line upper abdominal tenderness over what feels like a small hernia or rectus diastasis, with active bowel sounds. No guarding or palpable organomegaly noted. I examined her while standing, but it does not seem any more prominent to me in that position.  Skin; warm and dry,  no rash or jaundice noted  Neuro: awake, alert and oriented x 3. Normal gross motor function and fluent speech  Labs: Previous endoscopy, pathology and office notes reviewed as noted above.  Assessment: Encounter Diagnoses  Name Primary?  . Constipation, unspecified constipation type Yes  . RLQ abdominal pain   . Rectus diastasis   . Barrett's esophagus without dysplasia     Ms. Geisinger has long-standing chronic constipation with features of IBS. It is unknown if she has any degree of pelvic floor dysfunction or rectocele. I attempted to understand history as best I could from review of previous records and extensive questions. She seemed to be concerned that the history noted in the [facility] records was not  her recollection. She wanted to know why someone had told her she had a large precancerous polyp. I explained that the polyp was 4 mm in size, and that adenomatous polyps are potentially precancerous. She seemed to become annoyed by my continued questions while endeavoring to understand her history. She then wished to end the visit "so you can get on with your next patient". She did however ask [place] to put her in for recall at the appropriate time for her EGD and colonoscopy. I have done so since the patient indicates she plans to continue care with our practice.   Thank you for the courtesy of this consult.  Please call me with any questions or concerns.  Charlie Pitter III  CC: Verlon Au, MD

## 2016-12-14 NOTE — Patient Instructions (Signed)
If you are age 66 or older, your body mass index should be between 23-30. Your Body mass index is 46.98 kg/m. If this is out of the aforementioned range listed, please consider follow up with your Primary Care Provider.  If you are age 51 or younger, your body mass index should be between 19-25. Your Body mass index is 46.98 kg/m. If this is out of the aformentioned range listed, please consider follow up with your Primary Care Provider.   You will be due for a recall EGD  in June 2019. We will send you a reminder in the mail when it gets closer to that time.  Thank you for choosing [facility]  Dr Amada Jupiter III

## 2017-01-13 DIAGNOSIS — J381 Polyp of vocal cord and larynx: Secondary | ICD-10-CM | POA: Insufficient documentation

## 2017-03-02 ENCOUNTER — Encounter: Payer: Self-pay | Admitting: Cardiovascular Disease

## 2017-03-02 ENCOUNTER — Ambulatory Visit: Payer: Medicare Other | Admitting: Cardiovascular Disease

## 2017-03-02 ENCOUNTER — Telehealth: Payer: Self-pay | Admitting: Cardiovascular Disease

## 2017-03-02 VITALS — BP 124/76 | HR 65 | Ht 63.0 in | Wt 266.6 lb

## 2017-03-02 DIAGNOSIS — I10 Essential (primary) hypertension: Secondary | ICD-10-CM | POA: Diagnosis not present

## 2017-03-02 DIAGNOSIS — E7800 Pure hypercholesterolemia, unspecified: Secondary | ICD-10-CM

## 2017-03-02 DIAGNOSIS — R0789 Other chest pain: Secondary | ICD-10-CM

## 2017-03-02 DIAGNOSIS — Z5181 Encounter for therapeutic drug level monitoring: Secondary | ICD-10-CM | POA: Diagnosis not present

## 2017-03-02 MED ORDER — EZETIMIBE 10 MG PO TABS: 10.0000 mg | ORAL_TABLET | Freq: Every day | ORAL | 5 refills | Status: DC

## 2017-03-02 NOTE — Telephone Encounter (Signed)
New message   Patient calling with concerns of worsening SOB and chest pain.  Please call   Pt c/o of Chest Pain: STAT if CP now or developed within 24 hours  1. Are you having CP right now? No  2. Are you experiencing any other symptoms (ex. SOB, nausea, vomiting, sweating)? SOB  3. How long have you been experiencing CP? 2 months  4. Is your CP continuous or coming and going? Coming and going  5. Have you taken Nitroglycerin? No ?

## 2017-03-02 NOTE — Progress Notes (Signed)
Cardiology Office Note   Date:  03/07/2017   ID:  Kylie Wilson, DOB 04/13/1950, MRN [id number]  PCP:  Verlon Au, MD  Cardiologist:   Chilton Si, MD   No chief complaint on file.    History of Present Illness: Kylie Wilson is a 66 y.o. female with asymptomatic coronary calcification, hypertension, hyperlipidemia, diabetes, prior PE, and OSA here for follow up. Kylie Wilson was seen in the ED 08/23/16 with chest pain.  Troponin was negative x1 and she left before a second could be drawn.  EKG was unremarkable and d-dimer was negative.  She was discharged and followed up with her PCP the following day.  Her pain was felt to be musculoskeletal, but she was encouraged to follow up with cardiology.  She was seen in clinic 09/2016 and her symptoms were again thought to be musculoskeletal.  However, it was noted that she had a prior chest CT that revealed atherosclerosis of the coronaries and of the thoracic aorta.  There was also a small pericardial effusion.  She was referred for a Lexiscan Myoview 09/2016 that revealed LVEF 70% and no ischemia.    Since her last appointment Kylie Wilson continues to have chest pain.  Last night she thought that she was having a heart attack.  She was lying in bed and had substernal chest discomfort.  This was associated with shortness of breath but no nausea or diaphoresis.  She laid down one hour after eating dinner.  The symptoms continued until she fell asleep and she felt better in the AM.  She has noted chest discomfort after eating spicy foods.  Kylie Wilson denies chest pain or shortness of breath with exertion.  She exercises and does housework without discomfort.  She denies lower extremity edema, orthopnea or PND.  Kylie Wilson has  A history of statin intolerance.  She previously tried simvastatin and rosuvastatin but stopped them due to myalgias.    Past Medical History:  Diagnosis Date  . Arthritis   . Barrett's esophagus   . Cervical  radiculopathy   . Cervical spondylosis   . Chronic constipation   . DDD (degenerative disc disease), cervical   . Depression   . Diabetes mellitus    type 2   . GERD (gastroesophageal reflux disease)   . Hyperlipidemia   . Hypertension   . Obesity   . Rectocele   . Sleep apnea    does not tolerate cpap  . Uterine prolapse     Past Surgical History:  Procedure Laterality Date  . BREAST BIOPSY  1980   left  . BREAST EXCISIONAL BIOPSY Left    Bening 1980's  . SCAR REVISION  2001   both legs  . TOE SURGERY  1981   4th and 5th toe right foot  . TOE SURGERY  2002   great toe left  . TONSILLECTOMY    . TUBAL LIGATION       Current Outpatient Medications  Medication Sig Dispense Refill  . amLODipine (NORVASC) 10 MG tablet Take 1 tablet (10 mg total) by mouth daily. 90 tablet 1  . glucose blood (ACCU-CHEK AVIVA PLUS) test strip Use to test blood sugars daily. 100 each 3  . losartan (COZAAR) 25 MG tablet Take 1 tablet (25 mg total) by mouth daily. 90 tablet 1  . metFORMIN (GLUCOPHAGE) 500 MG tablet TAKE 1 TABLET BY MOUTH WITH BREAKFAST AND TWO TABLETS WITH SUPPER. 270 tablet 1  . Vitamin D, Ergocalciferol, (  DRISDOL) 50000 units CAPS capsule Take 1 capsule by mouth once a week.  2  . ezetimibe (ZETIA) 10 MG tablet Take 1 tablet (10 mg total) by mouth daily. 30 tablet 5   No current facility-administered medications for this visit.     Allergies:   Statins; Contrast media [iodinated diagnostic agents]; and Lisinopril    Social History:  The patient  reports that she quit smoking about 5 years ago. Her smoking use included cigarettes. She has a 24.00 pack-year smoking history. she has never used smokeless tobacco. She reports that she does not drink alcohol or use drugs.   Family History:  The patient's family history includes Breast cancer in her paternal aunt; Heart disease in her brother and father; Pancreatic cancer in her father; Stroke in her mother.    ROS:  Please see  the history of present illness.   Otherwise, review of systems are positive for back pain.   All other systems are reviewed and negative.    PHYSICAL EXAM: VS:  BP 124/76   Pulse 65   Ht 5\' 3"  (1.6 m)   Wt 266 lb 9.6 oz (120.9 kg)   BMI 47.23 kg/m  , BMI Body mass index is 47.23 kg/m. GENERAL:  Well appearing HEENT: Pupils equal round and reactive, fundi not visualized, oral mucosa unremarkable NECK:  No jugular venous distention, waveform within normal limits, carotid upstroke brisk and symmetric, no bruits, no thyromegaly LYMPHATICS:  No cervical adenopathy LUNGS:  Clear to auscultation bilaterally HEART:  RRR.  PMI not displaced or sustained,S1 and S2 within normal limits, no S3, no S4, no clicks, no rubs, no murmurs ABD:  Flat, positive bowel sounds normal in frequency in pitch, no bruits, no rebound, no guarding, no midline pulsatile mass, no hepatomegaly, no splenomegaly EXT:  2 plus pulses throughout, no edema, no cyanosis no clubbing SKIN:  No rashes no nodules NEURO:  Cranial nerves II through XII grossly intact, motor grossly intact throughout PSYCH:  Cognitively intact, oriented to person place and time   EKG:  EKG is ordered today. The ekg ordered 08/23/16 demonstrates Sinus rhythm. Rate 75 bpm. Nonspecific T wave abnormalities. 03/02/17: Sinus rhythm.  Rate 65 bpm.  Non-specific T wave abnormalities.  Lexiscan Myoview 09/27/16:  The left ventricular ejection fraction is hyperdynamic (>65%).  Nuclear stress EF: 70%.  There was no ST segment deviation noted during stress.  No T wave inversion was noted during stress.  uuuuuuiiThe study is normal.  This is a low risk study.   Recent Labs: 08/22/2016: BUN 10; Creatinine, Ser 0.77; Hemoglobin 11.9; Platelets 198; Potassium 3.8; Sodium 137    Lipid Panel    Component Value Date/Time   CHOL 197 06/04/2016 1114   TRIG 73.0 06/04/2016 1114   HDL 56.00 06/04/2016 1114   CHOLHDL 4 06/04/2016 1114   VLDL 14.6  06/04/2016 1114   LDLCALC 126 (H) 06/04/2016 1114      Wt Readings from Last 3 Encounters:  03/02/17 266 lb 9.6 oz (120.9 kg)  12/14/16 265 lb 3.2 oz (120.3 kg)  09/23/16 268 lb (121.6 kg)      ASSESSMENT AND PLAN:  # Atypical chest pain:  Chest pain is atypical and typically occurs when lying down or at rest.  She had a normal stress test 09/2016.  I suspect this is related to GERD.  She will stop eating 2 hours prior to lying dow and limit spicy food.    # Hypertension:  Blood pressure is well-controlled.  Continue amlodipine and losartan.  # Hyperlipidemia: ASCVD 10 year risk 19.1%.  Continue aspirin 81 mg daily.  Start Zetia and check lipids and CMP in 6 weeks.  # Morbid obesity: Encouraged diet and exercise.    Current medicines are reviewed at length with the patient today.  The patient does not have concerns regarding medicines.  The following changes have been made:  Start aspirin 81mg  and rosuvastatin 20 mg daily.   Labs/ tests ordered today include:   Orders Placed This Encounter  Procedures  . Lipid panel  . Comprehensive metabolic panel  . EKG 12-Lead     Disposition:   FU with Zyiere Rosemond C. Duke Salviaandolph, MD, [facility] in 6 months   This note was written with the assistance of speech recognition software.  Please excuse any transcriptional errors.  Signed, Vennela Jutte C. Duke Salviaandolph, MD, [facility]  03/07/2017 6:38 PM    [facility]

## 2017-03-02 NOTE — Patient Instructions (Addendum)
Medication Instructions:  START ZETIA 10 MG DAILY   Labwork: FASTING LP/CMET IN 6 WEEKS   Testing/Procedures: [id number]  Follow-Up: Your physician recommends that you schedule a follow-up appointment in: 6 MONTH OV  Any Other Special Instructions Will Be Listed Below (If Applicable).  Gastroesophageal Reflux Disease, Adult Normally, food travels down the esophagus and stays in the stomach to be digested. If a person has gastroesophageal reflux disease (GERD), food and stomach acid move back up into the esophagus. When this happens, the esophagus becomes sore and swollen (inflamed). Over time, GERD can make small holes (ulcers) in the lining of the esophagus. Follow these instructions at home: Diet  Follow a diet as told by your doctor. You may need to avoid foods and drinks such as: ? Coffee and tea (with or without caffeine). ? Drinks that contain alcohol. ? Energy drinks and sports drinks. ? Carbonated drinks or sodas. ? Chocolate and cocoa. ? Peppermint and mint flavorings. ? Garlic and onions. ? Horseradish. ? Spicy and acidic foods, such as peppers, chili powder, curry powder, vinegar, hot sauces, and BBQ sauce. ? Citrus fruit juices and citrus fruits, such as oranges, lemons, and limes. ? Tomato-based foods, such as red sauce, chili, salsa, and pizza with red sauce. ? Fried and fatty foods, such as donuts, french fries, potato chips, and high-fat dressings. ? High-fat meats, such as hot dogs, rib eye steak, sausage, ham, and bacon. ? High-fat dairy items, such as whole milk, butter, and cream cheese.  Eat small meals often. Avoid eating large meals.  Avoid drinking large amounts of liquid with your meals.  Avoid eating meals during the 2-3 hours before bedtime.  Avoid lying down right after you eat.  Do not exercise right after you eat. General instructions  Pay attention to any changes in your symptoms.  Take over-the-counter and prescription medicines only as  told by your doctor. Do not take aspirin, ibuprofen, or other NSAIDs unless your doctor says it is okay.  Do not use any tobacco products, including cigarettes, chewing tobacco, and e-cigarettes. If you need help quitting, ask your doctor.  Wear loose clothes. Do not wear anything tight around your waist.  Raise (elevate) the head of your bed about 6 inches (15 cm).  Try to lower your stress. If you need help doing this, ask your doctor.  If you are overweight, lose an amount of weight that is healthy for you. Ask your doctor about a safe weight loss goal.  Keep all follow-up visits as told by your doctor. This is important. Contact a doctor if:  You have new symptoms.  You lose weight and you do not know why it is happening.  You have trouble swallowing, or it hurts to swallow.  You have wheezing or a cough that keeps happening.  Your symptoms do not get better with treatment.  You have a hoarse voice. Get help right away if:  You have pain in your arms, neck, jaw, teeth, or back.  You feel sweaty, dizzy, or light-headed.  You have chest pain or shortness of breath.  You throw up (vomit) and your throw up looks like blood or coffee grounds.  You pass out (faint).  Your poop (stool) is bloody or black.  You cannot swallow, drink, or eat. This information is not intended to replace advice given to you by your health care provider. Make sure you discuss any questions you have with your health care provider. Document Released: 08/18/2007 Document Revised: 08/07/2015 Document  Reviewed: 06/26/2014 Elsevier Interactive Patient Education  [employer]2018 [employer]     If you need a refill on your cardiac medications before your next appointment, please call your pharmacy.

## 2017-03-02 NOTE — Telephone Encounter (Signed)
Spoke with pt, she reports chest pain last night, she was belching a lot but it did not provide relief. She also reports SOB for the last 3 months that is gradually getting worse. She is reporting abdominal distension but no peripheral edema. She denies orthopnea, she does not weigh. Discussed with dr Duke Salviarandolph, she will come to the office today to be seen.

## 2017-03-04 DIAGNOSIS — Z87891 Personal history of nicotine dependence: Secondary | ICD-10-CM | POA: Insufficient documentation

## 2017-03-07 ENCOUNTER — Encounter: Payer: Self-pay | Admitting: Cardiovascular Disease

## 2017-03-11 ENCOUNTER — Ambulatory Visit: Payer: Medicare Other | Admitting: Cardiovascular Disease

## 2017-04-15 ENCOUNTER — Institutional Professional Consult (permissible substitution): Payer: Medicare Other | Admitting: Internal Medicine

## 2017-04-20 ENCOUNTER — Other Ambulatory Visit: Payer: Self-pay | Admitting: Family Medicine

## 2017-04-20 DIAGNOSIS — N644 Mastodynia: Secondary | ICD-10-CM

## 2017-04-25 DIAGNOSIS — Z86010 Personal history of colonic polyps: Secondary | ICD-10-CM | POA: Insufficient documentation

## 2017-04-27 ENCOUNTER — Ambulatory Visit
Admission: RE | Admit: 2017-04-27 | Discharge: 2017-04-27 | Disposition: A | Discharge status: Home / Self Care | Payer: Medicare Other | Source: Ambulatory Visit | Attending: Family Medicine | Admitting: Family Medicine

## 2017-04-27 ENCOUNTER — Ambulatory Visit
Admission: RE | Admit: 2017-04-27 | Discharge: 2017-04-27 | Disposition: A | Discharge status: Home / Self Care | Payer: Medicare HMO | Source: Ambulatory Visit | Attending: Family Medicine | Admitting: Family Medicine

## 2017-04-27 ENCOUNTER — Other Ambulatory Visit: Payer: Self-pay | Admitting: Family Medicine

## 2017-04-27 DIAGNOSIS — N644 Mastodynia: Secondary | ICD-10-CM

## 2017-05-06 DIAGNOSIS — K5730 Diverticulosis of large intestine without perforation or abscess without bleeding: Secondary | ICD-10-CM | POA: Insufficient documentation

## 2017-05-27 ENCOUNTER — Other Ambulatory Visit: Payer: Self-pay | Admitting: Family Medicine

## 2017-06-11 ENCOUNTER — Other Ambulatory Visit: Payer: Self-pay | Admitting: Family Medicine

## 2017-06-11 DIAGNOSIS — I10 Essential (primary) hypertension: Secondary | ICD-10-CM

## 2017-08-01 DIAGNOSIS — R062 Wheezing: Secondary | ICD-10-CM | POA: Insufficient documentation

## 2017-08-17 ENCOUNTER — Other Ambulatory Visit: Payer: Self-pay | Admitting: Family Medicine

## 2017-08-17 DIAGNOSIS — Z1231 Encounter for screening mammogram for malignant neoplasm of breast: Secondary | ICD-10-CM

## 2017-08-31 ENCOUNTER — Ambulatory Visit: Payer: Medicare Other | Admitting: Cardiovascular Disease

## 2017-11-16 ENCOUNTER — Ambulatory Visit: Payer: Medicare HMO | Admitting: Cardiovascular Disease

## 2017-12-14 IMAGING — MR MR WRIST*R* W/O CM
5 series · 40 of 40 positions shown · non-contrast
Comparison: None.

CLINICAL DATA: Ulnar-sided pain. Patient notes lumps along the
right medial wrist x2 months with pain and numbness.

EXAM:
MR OF THE RIGHT WRIST WITHOUT CONTRAST
TECHNIQUE: Multiplanar, multisequence MR imaging of the right wrist was
performed. No intravenous contrast was administered.

[Series 3: T2 fat-sat · axial · right · 3.0mm · 0.28mm/px · z∈[-105,-27]mm · 13 of 29 slices shown (1 of 2)]
[im 1/29]
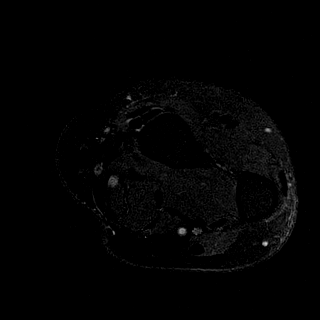
[im 3/29]
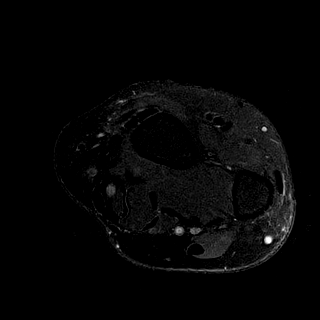
[im 5/29]
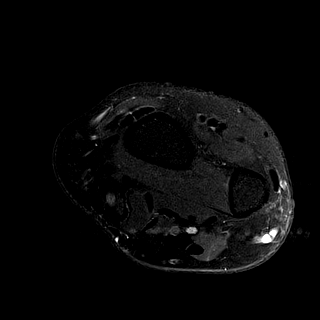
[im 8/29]
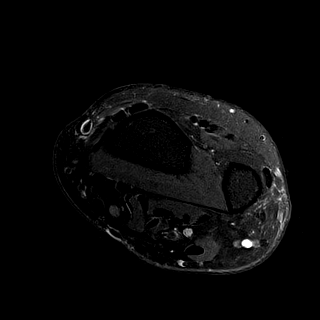
[im 10/29]
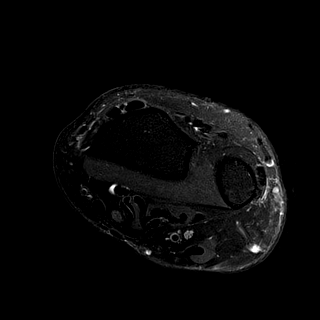
[im 12/29]
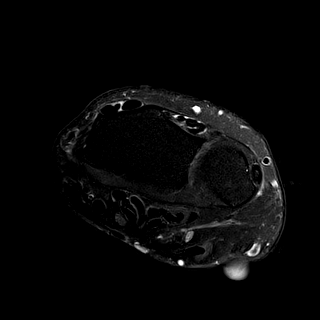
[im 15/29]
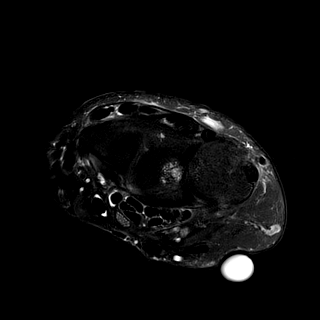
[im 17/29]
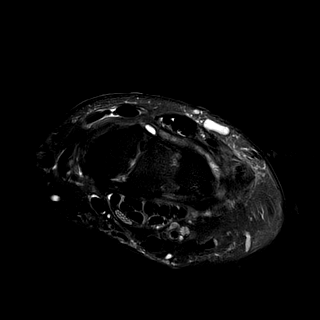
[im 19/29]
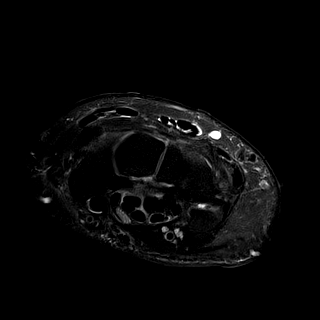
[im 22/29]
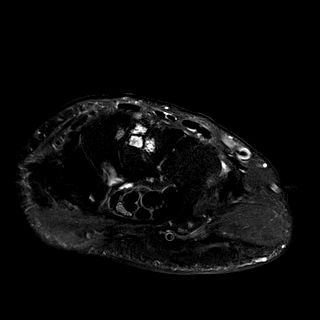
[im 24/29]
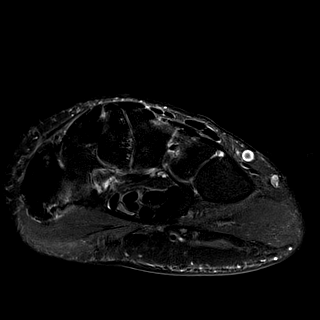
[im 26/29]
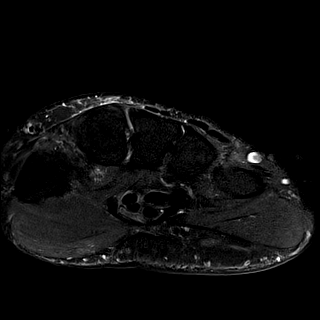
[im 29/29]
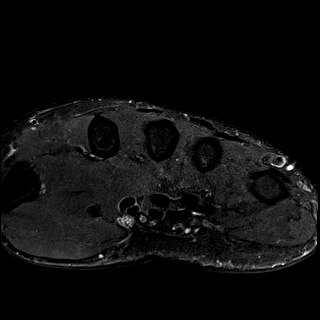

[Series 5: T1 · coronal · right · 3.0mm · 0.26mm/px · 6 of 14 slices shown]
[im 1/14]
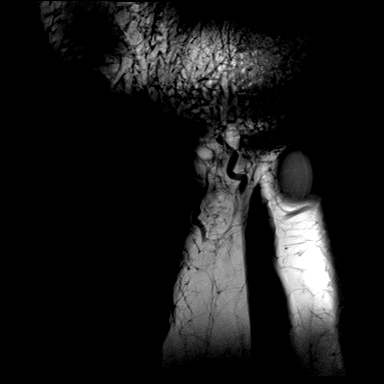
[im 3/14]
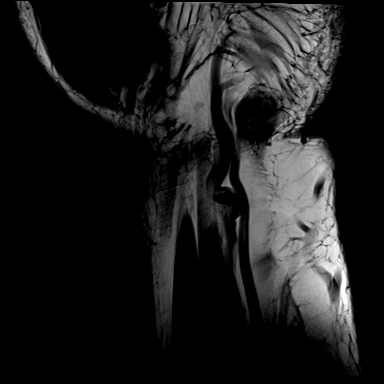
[im 6/14]
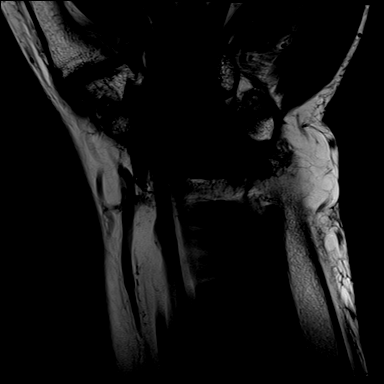
[im 8/14]
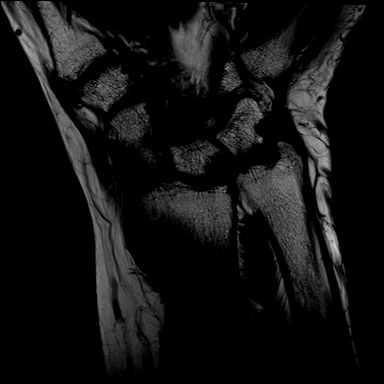
[im 11/14]
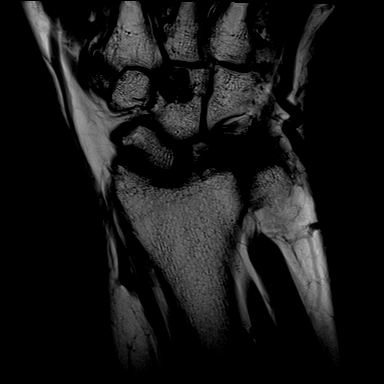
[im 14/14]
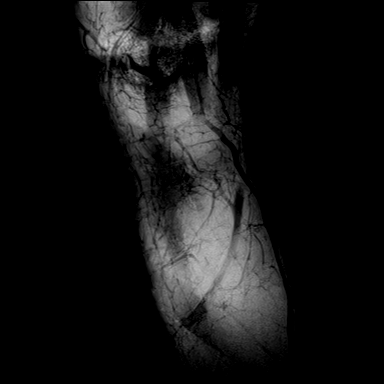

[Series 6: T2 fat-sat · coronal · right · 3.0mm · 0.31mm/px · 6 of 14 slices shown (2 of 2)]
[im 1/14]
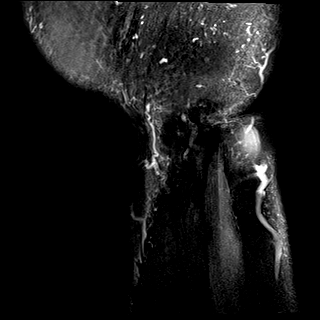
[im 3/14]
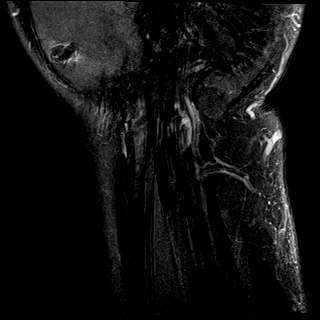
[im 6/14]
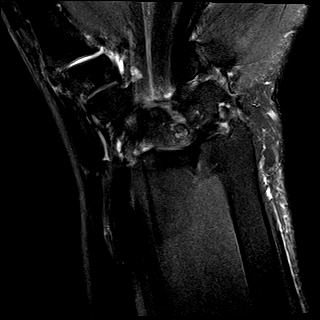
[im 8/14]
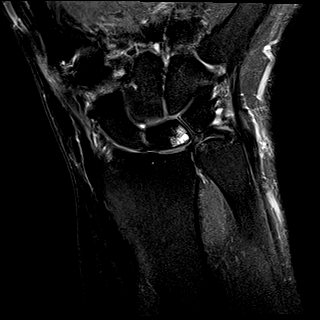
[im 11/14]
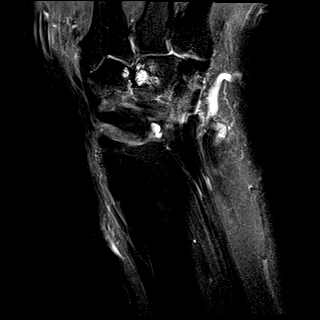
[im 14/14]
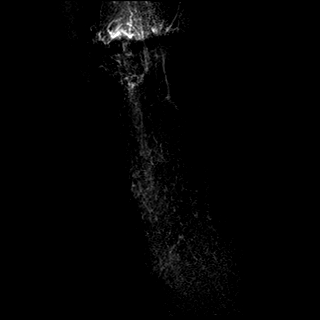

[Series 7: PD fat-sat · coronal · right · 3.0mm · 0.31mm/px · 6 of 14 slices shown (1 of 2)]
[im 1/14]
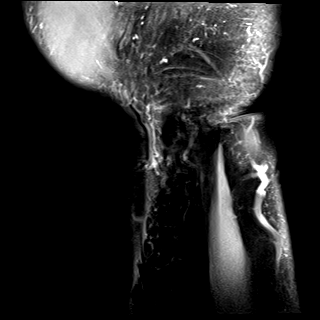
[im 3/14]
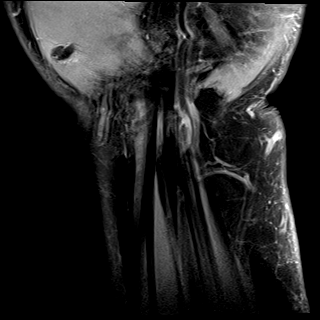
[im 6/14]
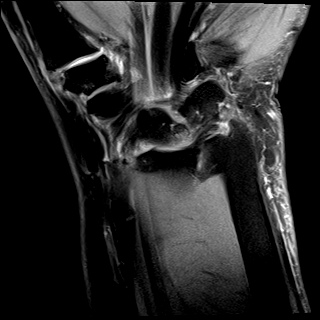
[im 8/14]
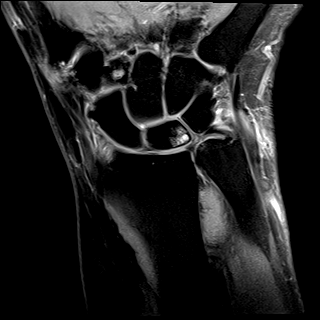
[im 11/14]
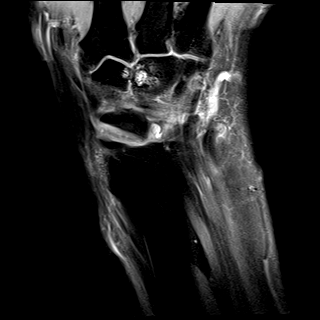
[im 14/14]
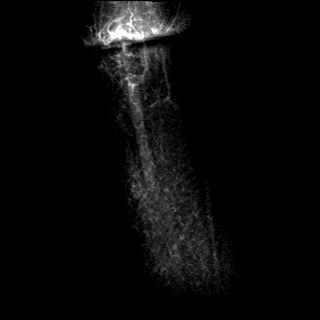

[Series 8: PD fat-sat · oblique · right · 3.0mm · 0.28mm/px · 9 of 22 slices shown (2 of 2)]
[im 1/22]
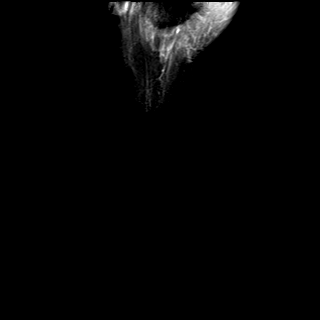
[im 3/22]
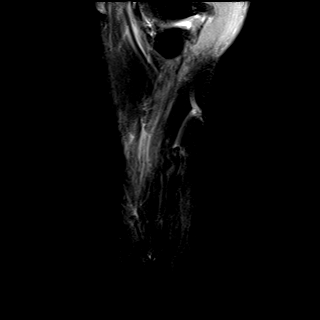
[im 6/22]
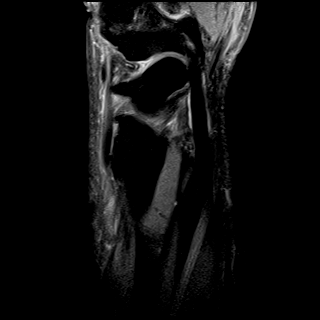
[im 8/22]
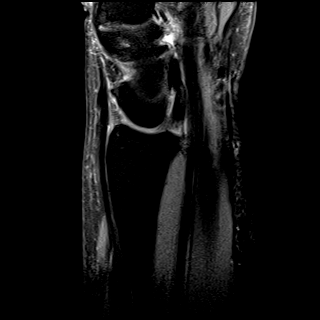
[im 11/22]
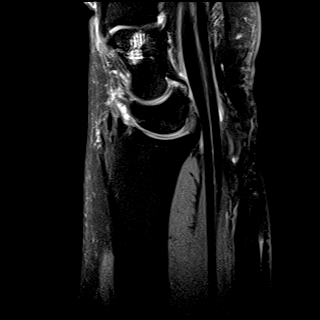
[im 14/22]
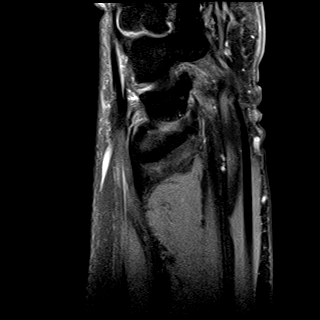
[im 16/22]
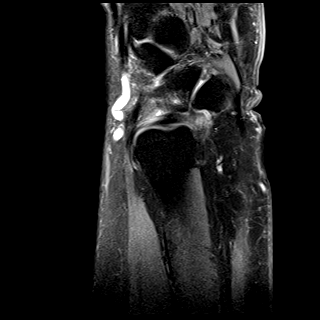
[im 19/22]
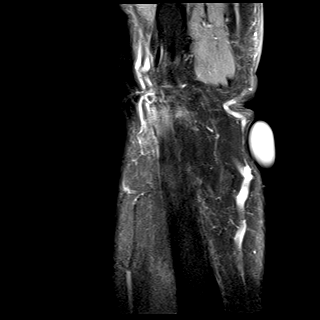
[im 22/22]
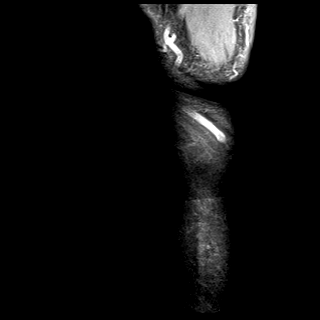

[40 of 40 positions shown; findings below may reference images not displayed]

FINDINGS: Ligaments: Intact scapholunate and lunatotriquetral ligaments.

Triangular fibrocartilage: Central perforation of the triangular
fibrocartilage complex with trace fluid in the distal radioulnar
joint.

Tendons: Slight thickening of the extensor carpi ulnaris consistent
with mild tendinopathy. No subluxation or dislocations. The flexor
and extensor tendons are maintained with mild tenosynovitis
suggested along the course of the extensor carpi radialis brevis and
longus as well as extensor digitorum and extensor pollicis longus
tendons. Minimal tenosynovitis also noted along the flexor tendons
involving the flexor carpi radialis and flexor pollicis longus
tendons.

Carpal tunnel/median nerve: Normal carpal tunnel. Normal median
nerve.

Guyon's canal: Normal.

Joint/cartilage: Chondromalacia is seen at the base of the lunate
along the ulnar aspect.

Bones/carpal alignment: Ulnar positive variance with signs of ulnar
abutment characterized by subchondral cystic change involving the
base of the lunate and triquetrum bones. Subchondral degenerative
cystic change is also noted of the capitate and trapezoid.

Other: No focal soft tissue mass identified.
IMPRESSION: 1. Central perforation of the triangular fibrocartilage complex
secondary to ulnar positive variance.
2. Changes of ulnar abutment with subchondral cystic change of the
lunate and trapezoid with focal chondromalacia overlying the cysts
of the lunate.
3. Osteoarthritic subchondral cystic change between the trapezoid
and capitate.
4. Mild tenosynovitis as above described involving several flexor
and extensor tendons.
5. Mild tendinopathy of the extensor carpi ulnaris.

## 2018-03-20 ENCOUNTER — Other Ambulatory Visit: Payer: Self-pay | Admitting: Family Medicine

## 2018-03-20 DIAGNOSIS — Z1231 Encounter for screening mammogram for malignant neoplasm of breast: Secondary | ICD-10-CM

## 2018-04-19 ENCOUNTER — Ambulatory Visit
Admission: RE | Admit: 2018-04-19 | Discharge: 2018-04-19 | Disposition: A | Discharge status: Home / Self Care | Payer: Medicare Other | Source: Ambulatory Visit | Attending: Family Medicine | Admitting: Family Medicine

## 2018-04-19 DIAGNOSIS — Z1231 Encounter for screening mammogram for malignant neoplasm of breast: Secondary | ICD-10-CM

## 2018-04-20 ENCOUNTER — Other Ambulatory Visit: Payer: Self-pay | Admitting: Family Medicine

## 2018-04-20 DIAGNOSIS — R928 Other abnormal and inconclusive findings on diagnostic imaging of breast: Secondary | ICD-10-CM

## 2018-04-21 ENCOUNTER — Other Ambulatory Visit: Payer: Self-pay | Admitting: Family Medicine

## 2018-04-21 DIAGNOSIS — R928 Other abnormal and inconclusive findings on diagnostic imaging of breast: Secondary | ICD-10-CM

## 2018-04-25 ENCOUNTER — Other Ambulatory Visit: Payer: Self-pay | Admitting: Family Medicine

## 2018-04-25 ENCOUNTER — Ambulatory Visit
Admission: RE | Admit: 2018-04-25 | Discharge: 2018-04-25 | Disposition: A | Discharge status: Home / Self Care | Payer: Medicare Other | Source: Ambulatory Visit | Attending: Family Medicine | Admitting: Family Medicine

## 2018-04-25 DIAGNOSIS — R928 Other abnormal and inconclusive findings on diagnostic imaging of breast: Secondary | ICD-10-CM

## 2018-04-25 DIAGNOSIS — N6082 Other benign mammary dysplasias of left breast: Secondary | ICD-10-CM

## 2018-05-04 ENCOUNTER — Other Ambulatory Visit: Payer: Self-pay | Admitting: Family Medicine

## 2018-05-08 ENCOUNTER — Telehealth: Payer: Self-pay | Admitting: Cardiovascular Disease

## 2018-05-08 NOTE — Telephone Encounter (Signed)
Spoke with pt, she reports for the last 2-3 weeks a lot of belching and then will get a pain down her left arm and she feels full in her chest. She does feel that belching will make the fullness feel better but it does not go completely away. She has not tried any antiacids or anything for her stomach. Encouraged the patient to see the pa or np and she refused and hung up.

## 2018-05-08 NOTE — Telephone Encounter (Signed)
New Message    PT is calling because she is having pain down her left arm, indigestion and gas. She says it is not constant, Symptoms come and go, but she says the gas is every day. She would like an appt with Dr Duke Salvia  I offered her an appt with a PA and she declined and wants to only see Rndolph.  Please call back

## 2018-07-25 IMAGING — MG DIGITAL DIAGNOSTIC UNILATERAL RIGHT MAMMOGRAM WITH TOMO AND CAD
6 series · 6 of 18 positions shown · non-contrast
Comparison: Previous exam(s).

CLINICAL DATA: Diffuse right breast pain for 2 months.

EXAM:
DIGITAL DIAGNOSTIC RIGHT MAMMOGRAM WITH CAD AND TOMO
ULTRASOUND RIGHT BREAST

[R MLO synth-2D]
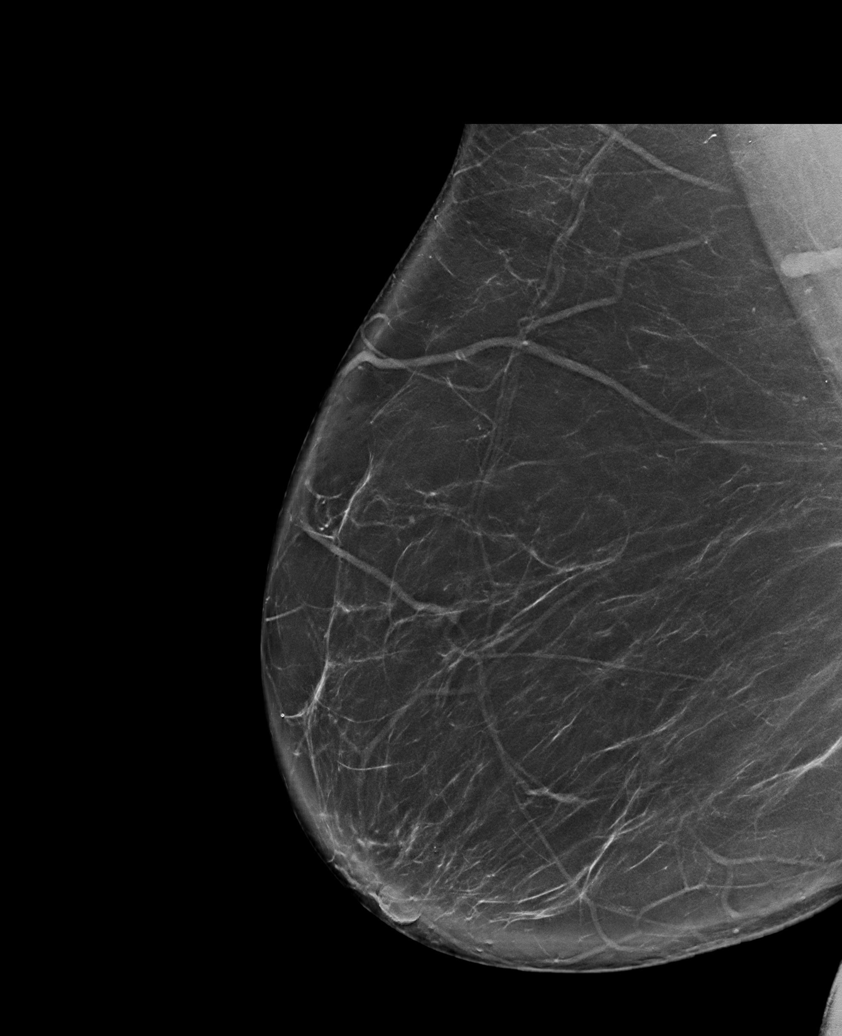

[R CC synth-2D (1 of 2)]
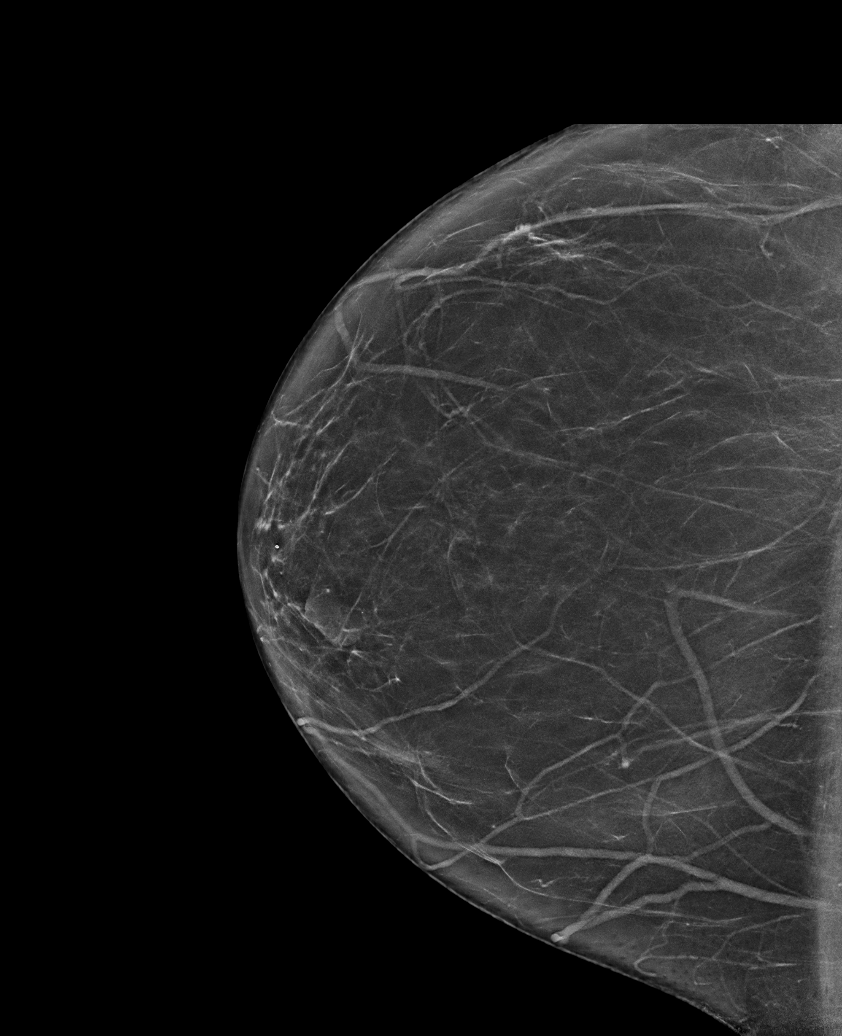

[R CC synth-2D (2 of 2)]
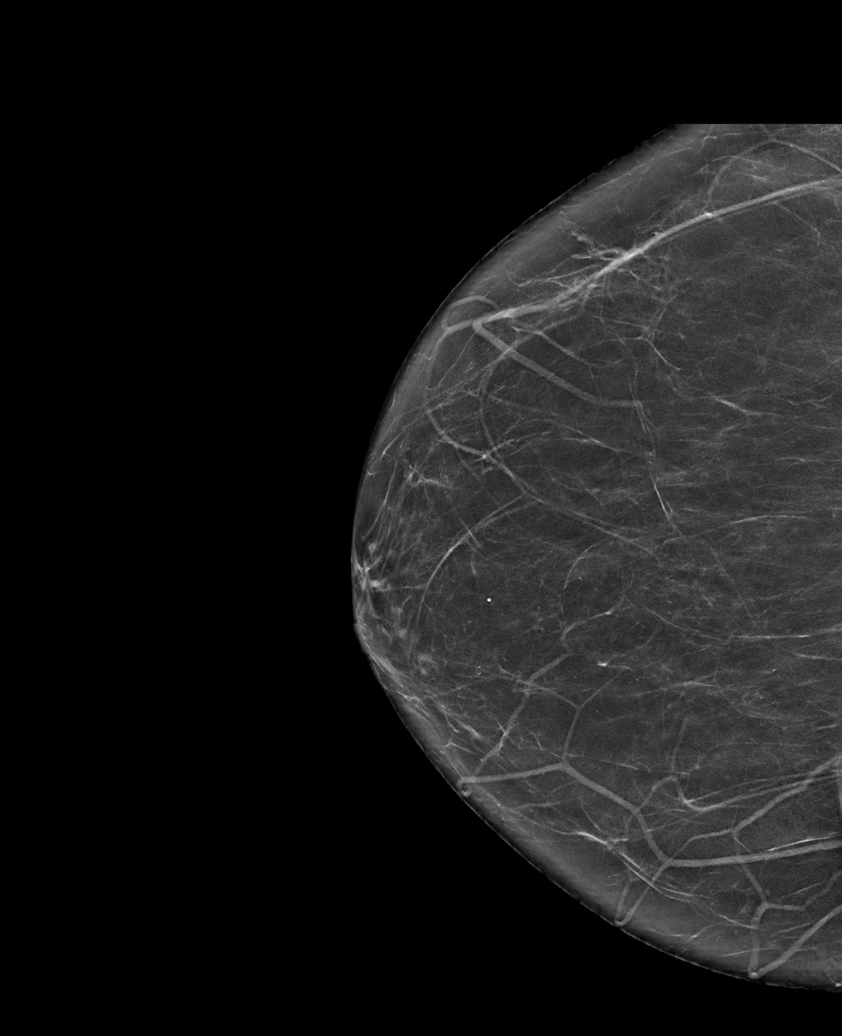

[R CC tomo (1 of 2) · tomo slice 39/76.0]
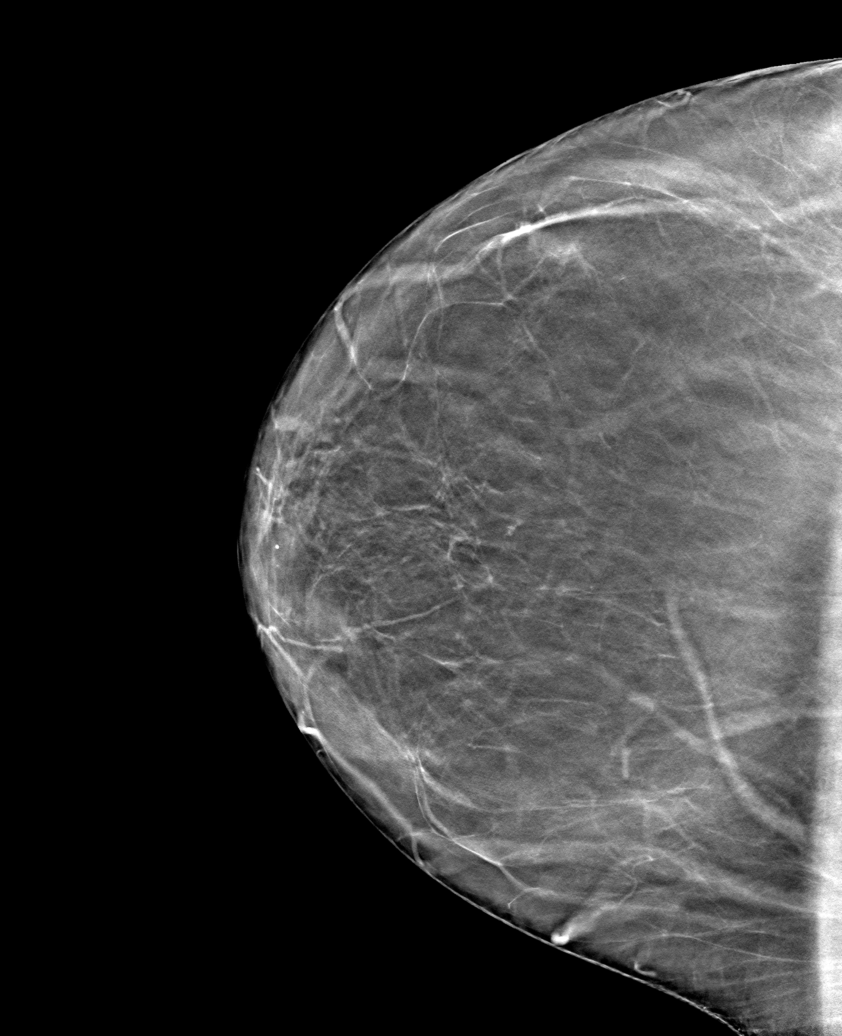

[R CC tomo (2 of 2) · tomo slice 38/75.0]
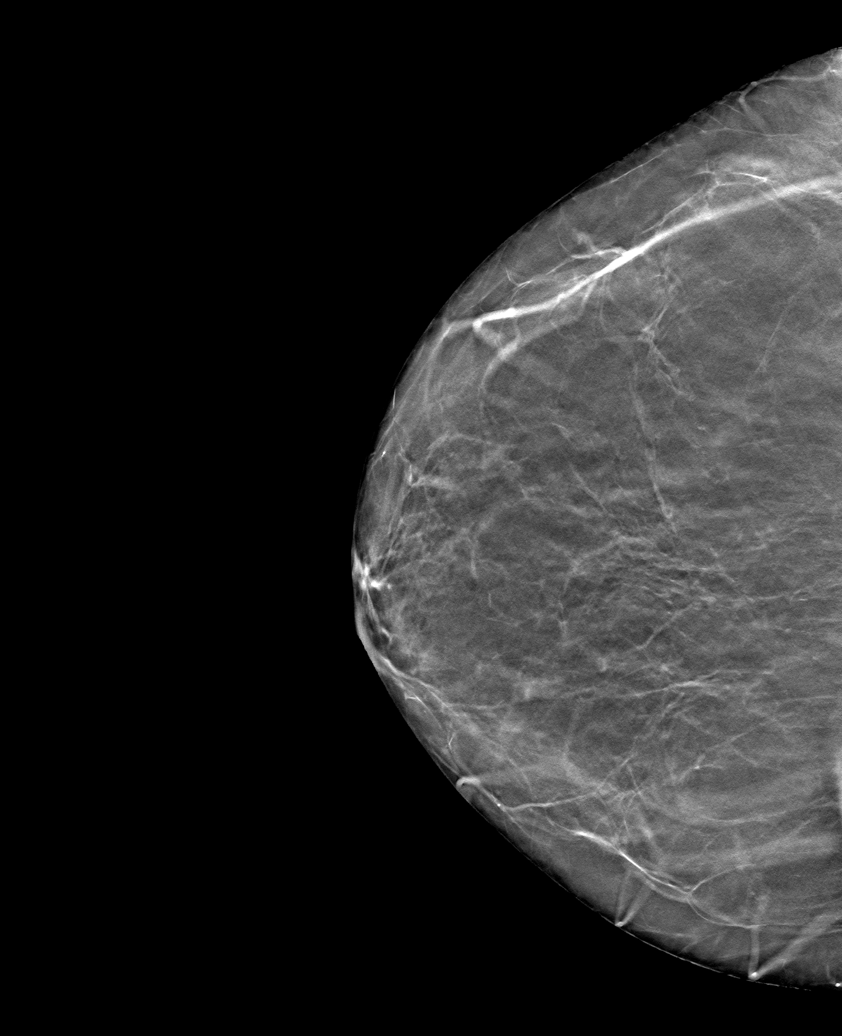

[R MLO tomo · tomo slice 46/91.0]
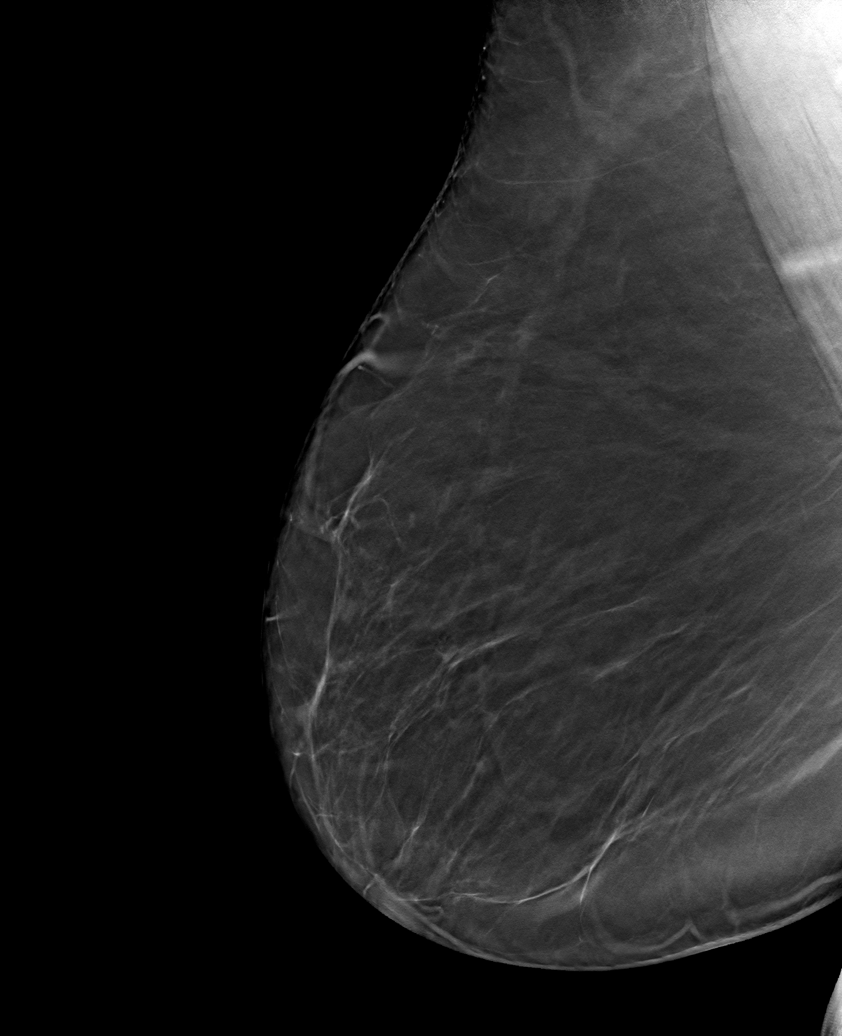

[6 of 18 positions shown; findings below may reference images not displayed]

ACR Breast Density Category b: There are scattered areas of
fibroglandular density.
FINDINGS: Mammographically, there are no suspicious masses, areas of
architectural distortion or microcalcifications in the right breast.

Mammographic images were processed with CAD.

On physical exam, no suspicious masses are palpated.

Targeted ultrasound is performed, showing no suspicious masses or
shadowing lesions. No evidence of right axillary lymphadenopathy.
IMPRESSION: No mammographic sonographic evidence of malignancy in the right
breast.

RECOMMENDATION:
Further management of patient's diffuse right breast pain should be
based on clinical grounds.

Otherwise, screening mammogram in one year.(Code:QL-C-I7R)

I have discussed the findings and recommendations with the patient.
Results were also provided in writing at the conclusion of the
visit. If applicable, a reminder letter will be sent to the patient
regarding the next appointment.

BI-RADS CATEGORY  1: Negative.

## 2018-08-17 DIAGNOSIS — M519 Unspecified thoracic, thoracolumbar and lumbosacral intervertebral disc disorder: Secondary | ICD-10-CM | POA: Insufficient documentation

## 2018-08-18 DIAGNOSIS — H318 Other specified disorders of choroid: Secondary | ICD-10-CM | POA: Insufficient documentation

## 2018-08-18 DIAGNOSIS — H40053 Ocular hypertension, bilateral: Secondary | ICD-10-CM | POA: Insufficient documentation

## 2018-09-04 ENCOUNTER — Ambulatory Visit: Payer: Medicare Other | Admitting: Podiatry

## 2018-09-04 ENCOUNTER — Other Ambulatory Visit: Payer: Self-pay

## 2018-09-04 ENCOUNTER — Encounter: Payer: Self-pay | Admitting: Podiatry

## 2018-09-04 VITALS — Temp 96.0°F

## 2018-09-04 DIAGNOSIS — M2041 Other hammer toe(s) (acquired), right foot: Secondary | ICD-10-CM

## 2018-09-04 DIAGNOSIS — B351 Tinea unguium: Secondary | ICD-10-CM | POA: Diagnosis not present

## 2018-09-04 DIAGNOSIS — M79675 Pain in left toe(s): Secondary | ICD-10-CM

## 2018-09-04 DIAGNOSIS — E119 Type 2 diabetes mellitus without complications: Secondary | ICD-10-CM

## 2018-09-04 DIAGNOSIS — L84 Corns and callosities: Secondary | ICD-10-CM | POA: Diagnosis not present

## 2018-09-04 DIAGNOSIS — M79674 Pain in right toe(s): Secondary | ICD-10-CM | POA: Diagnosis not present

## 2018-09-04 DIAGNOSIS — M2042 Other hammer toe(s) (acquired), left foot: Secondary | ICD-10-CM

## 2018-09-04 NOTE — Patient Instructions (Signed)
Diabetes Mellitus and Foot Care  Foot care is an important part of your health, especially when you have diabetes. Diabetes may cause you to have problems because of poor blood flow (circulation) to your feet and legs, which can cause your skin to:   Become thinner and drier.   Break more easily.   Heal more slowly.   Peel and crack.  You may also have nerve damage (neuropathy) in your legs and feet, causing decreased feeling in them. This means that you may not notice minor injuries to your feet that could lead to more serious problems. Noticing and addressing any potential problems early is the best way to prevent future foot problems.  How to care for your feet  Foot hygiene   Wash your feet daily with warm water and mild soap. Do not use hot water. Then, pat your feet and the areas between your toes until they are completely dry. Do not soak your feet as this can dry your skin.   Trim your toenails straight across. Do not dig under them or around the cuticle. File the edges of your nails with an emery board or nail file.   Apply a moisturizing lotion or petroleum jelly to the skin on your feet and to dry, brittle toenails. Use lotion that does not contain alcohol and is unscented. Do not apply lotion between your toes.  Shoes and socks   Wear clean socks or stockings every day. Make sure they are not too tight. Do not wear knee-high stockings since they may decrease blood flow to your legs.   Wear shoes that fit properly and have enough cushioning. Always look in your shoes before you put them on to be sure there are no objects inside.   To break in new shoes, wear them for just a few hours a day. This prevents injuries on your feet.  Wounds, scrapes, corns, and calluses   Check your feet daily for blisters, cuts, bruises, sores, and redness. If you cannot see the bottom of your feet, use a mirror or ask someone for help.   Do not cut corns or calluses or try to remove them with medicine.   If you  find a minor scrape, cut, or break in the skin on your feet, keep it and the skin around it clean and dry. You may clean these areas with mild soap and water. Do not clean the area with peroxide, alcohol, or iodine.   If you have a wound, scrape, corn, or callus on your foot, look at it several times a day to make sure it is healing and not infected. Check for:  ? Redness, swelling, or pain.  ? Fluid or blood.  ? Warmth.  ? Pus or a bad smell.  General instructions   Do not cross your legs. This may decrease blood flow to your feet.   Do not use heating pads or hot water bottles on your feet. They may burn your skin. If you have lost feeling in your feet or legs, you may not know this is happening until it is too late.   Protect your feet from hot and cold by wearing shoes, such as at the beach or on hot pavement.   Schedule a complete foot exam at least once a year (annually) or more often if you have foot problems. If you have foot problems, report any cuts, sores, or bruises to your health care provider immediately.  Contact a health care provider if:     You have a medical condition that increases your risk of infection and you have any cuts, sores, or bruises on your feet.   You have an injury that is not healing.   You have redness on your legs or feet.   You feel burning or tingling in your legs or feet.   You have pain or cramps in your legs and feet.   Your legs or feet are numb.   Your feet always feel cold.   You have pain around a toenail.  Get help right away if:   You have a wound, scrape, corn, or callus on your foot and:  ? You have pain, swelling, or redness that gets worse.  ? You have fluid or blood coming from the wound, scrape, corn, or callus.  ? Your wound, scrape, corn, or callus feels warm to the touch.  ? You have pus or a bad smell coming from the wound, scrape, corn, or callus.  ? You have a fever.  ? You have a red line going up your leg.  Summary   Check your feet every day  for cuts, sores, red spots, swelling, and blisters.   Moisturize feet and legs daily.   Wear shoes that fit properly and have enough cushioning.   If you have foot problems, report any cuts, sores, or bruises to your health care provider immediately.   Schedule a complete foot exam at least once a year (annually) or more often if you have foot problems.  This information is not intended to replace advice given to you by your health care provider. Make sure you discuss any questions you have with your health care provider.  Document Released: 02/27/2000 Document Revised: 04/13/2017 Document Reviewed: 04/02/2016  Elsevier Interactive Patient Education  2019 [employer]

## 2018-09-12 NOTE — Progress Notes (Signed)
Subjective: Kylie EaringJanice Robinson Wilson presents today referred by Verlon AuBoyd, Tammy Lamonica, MD for diabetic foot exam.  Patient states she has been diabetic since 1999.  She denies any history of foot wounds.  She denies numbness, tingling, burning, or pins-and-needles sensation.    She relates that her she does have a past surgical history of for left bunion repair.    She presents with concern of painful, discolored, thick toenails which interfere with daily activities.  She relates she has cracked nail plate of the right big toe for the past 5 to 6 years.  She relates gradual onset.  She has had treatment at a nail salon in the past.    Past Medical History:  Diagnosis Date  . Arthritis   . Barrett's esophagus   . Cervical radiculopathy   . Cervical spondylosis   . Chronic constipation   . DDD (degenerative disc disease), cervical   . Depression   . Diabetes mellitus    type 2   . GERD (gastroesophageal reflux disease)   . Hyperlipidemia   . Hypertension   . Obesity   . Rectocele   . Sleep apnea    does not tolerate cpap  . Uterine prolapse      Patient Active Problem List   Diagnosis Date Noted  . 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 Lumbar disc disease 08/17/2018  . Wheezing 08/01/2017  . Diverticulosis of large intestine without hemorrhage 05/06/2017  . History of adenomatous polyp of colon 04/25/2017  . Former smoker 03/04/2017  . Vocal cord polyp 01/13/2017  . Vitamin D deficiency 10/15/2016  . Refused pneumococcal vaccination 10/13/2016  . Generalized osteoarthritis of multiple sites 07/16/2016  . Low back pain at multiple sites 07/16/2016  . Cervicalgia 07/16/2016  . Chronic pain disorder 07/16/2016  . COPD (chronic obstructive pulmonary disease) with chronic bronchitis (HCC) 04/11/2016  . 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 COPD (chronic obstructive pulmonary disease) (HCC) 02/17/2016  . Obesity due to excess calories 02/17/2016  . Chronic idiopathic constipation 03/20/2015  . Morbid obesity with BMI of 45.0-49.9, adult (HCC) 03/20/2015  . Unspecified constipation 09/22/2011  . Background diabetic retinopathy (HCC) 09/09/2011  . Exudative macular degeneration (HCC) 09/09/2011  . HYPERLIPIDEMIA 08/27/2009  . DEPRESSION 08/27/2009  . Essential hypertension 08/27/2009  . Sleep apnea 08/27/2009  . DYSPNEA 08/27/2009  . Major depressive disorder, single episode 08/27/2009  . BARRETTS ESOPHAGUS 08/07/2009  . ESOPHAGEAL REFLUX 08/07/2008  . COUGH 08/07/2008  . DIVERTICULOSIS OF COLON 11/17/2007     Past Surgical History:  Procedure Laterality Date  . BREAST BIOPSY  1980   left  . BREAST EXCISIONAL BIOPSY Left    Bening 1980's  . SCAR REVISION  2001   both legs  . TOE SURGERY  1981   4th and 5th toe right foot  . TOE SURGERY  2002   great toe left  . TONSILLECTOMY    . TUBAL LIGATION        Current Outpatient Medications:  .  ACCU-CHEK AVIVA PLUS test strip, USE TO TEST BLOOD SUGARS DAILY., Disp: 100 each, Rfl: 1 .  amLODipine (NORVASC) 10 MG tablet, Take 1 tablet (10 mg total) by mouth daily., Disp: 90 tablet, Rfl: 1 .  Blood Glucose Monitoring Suppl (GLUCOCOM BLOOD GLUCOSE MONITOR) DEVI, One Touch Verio Meter. FSBS BID. E11.319, Disp: , Rfl:  .  losartan (  COZAAR) 25 MG tablet, TAKE 1 TABLET BY MOUTH EVERY DAY, Disp: 90 tablet, Rfl: 1 .  metFORMIN (GLUCOPHAGE) 500 MG tablet, TAKE 1 TABLET BY MOUTH WITH BREAKFAST AND TWO TABLETS WITH SUPPER., Disp: 270 tablet, Rfl: 1 .  Vitamin D, Ergocalciferol, (DRISDOL) 50000 units CAPS capsule, Take 1 capsule by mouth once a week., Disp: , Rfl: 2 .  ezetimibe (ZETIA) 10 MG tablet, Take 1 tablet (10 mg total) by mouth daily., Disp: 30 tablet, Rfl: 5   Allergies  Allergen Reactions  . Statins Other (See Comments)    Muscle cramps   . Contrast Media [Iodinated Diagnostic Agents]     Nauseated  . Lisinopril Other (See Comments)    cough     Social History   Occupational History  . Occupation: Retired    Associate Professormployer: UNEMPLOYED  Tobacco Use  . Smoking status: Former Smoker    Packs/day: 0.80    Years: 30.00    Pack years: 24.00    Types: Cigarettes    Quit date: 09/15/2011    Years since quitting: 6.9  . Smokeless tobacco: Never Used  . Tobacco comment: smoked 20 years; then quit 10; smoked 30 years plus  Substance and Sexual Activity  . Alcohol use: No  . Drug use: No  . Sexual activity: Not on file     Family History  Problem Relation Age of Onset  . Heart disease Father   . Pancreatic cancer Father   . Breast cancer Paternal Aunt   . Stroke Mother   . Heart disease Brother   . Colon cancer Neg Hx   . Stomach cancer Neg Hx   . Colon polyps Neg Hx   . Rectal cancer Neg Hx       There is no immunization history on file for this patient.   Review of systems: Positive Findings in bold print.  Constitutional:  chills, fatigue, fever, sweats, weight change Communication: Nurse, learning disabilitytranslator, sign Presenter, broadcastinglanguage translator, hand writing, iPad/Android device Head: headaches, head injury Eyes: changes in vision, eye pain, glaucoma, cataracts, macular degeneration, diplopia, glare,  light sensitivity, eyeglasses or contacts, blindness Ears nose mouth throat: hearing impaired, hearing aids,  ringing in ears, deaf, sign language,  vertigo,   nosebleeds,  rhinitis,  cold sores, snoring, swollen glands Cardiovascular: HTN, edema, arrhythmia, pacemaker in place, defibrillator in place, chest pain/tightness, chronic anticoagulation, blood clot, heart failure, MI Peripheral Vascular: leg cramps, varicose veins, blood clots, lymphedema, varicosities Respiratory:  difficulty breathing, denies congestion, SOB, wheezing, cough, emphysema Gastrointestinal: change in appetite or weight, abdominal pain, constipation, diarrhea,  nausea, vomiting, vomiting blood, change in bowel habits, abdominal pain, jaundice, rectal bleeding, hemorrhoids, GERD Genitourinary:  nocturia,  pain on urination, polyuria,  blood in urine, Foley catheter, urinary urgency, ESRD on hemodialysis Musculoskeletal: amputation, cramping, stiff joints, painful joints, decreased joint motion, fractures, OA, gout, hemiplegia, paraplegia, uses cane, wheelchair bound, uses walker, uses rollator Skin: +changes in toenails, color change, dryness, itching, mole changes,  rash, wound(s) Neurological: headaches, numbness in feet, paresthesias in feet, burning in feet, fainting,  seizures, change in speech. denies headaches, memory problems/poor historian, cerebral palsy, weakness, paralysis, CVA, TIA Endocrine: diabetes, hypothyroidism, hyperthyroidism,  goiter, dry mouth, flushing, heat intolerance,  cold intolerance,  excessive thirst, denies polyuria,  nocturia Hematological:  easy bleeding, excessive bleeding, easy bruising, enlarged lymph nodes, on long term blood thinner, history of past transusions Allergy/immunological:  hives, eczema, frequent infections, multiple drug allergies, seasonal allergies, transplant recipient, multiple food allergies Psychiatric:  anxiety, depression,  mood disorder, suicidal ideations, hallucinations, insomnia  Objective: Vitals:   09/04/18 0929  Temp: (!) 96 F (35.6 C)    Vascular Examination: Capillary refill time immediate x 10 digits.  Dorsalis pedis pulses palpable bilaterally.  Posterior tibial pulses palpable bilaterally.  No digital hair x 10 digits.  Skin temperature gradient WNL b/l  Dermatological Examination: Skin with normal turgor, texture and tone b/l.  Toenails 1-5 b/l discolored, thick, dystrophic with subungual debris and pain with palpation to nailbeds due to thickness of nails.  Hyperkeratotic lesion dorsal fifth PIPJ bilaterally. No erythema, no edema, no drainage, no flocculence  noted.  Musculoskeletal: Muscle strength 5/5 to all LE muscle groups.  Hammertoe bilateral fifth digits.  Pes planus foot deformity bilaterally.  Neurological: Sensation intact with 10 gram monofilament.  Vibratory sensation intact.  Assessment: 1. Painful onychomycosis toenails 1-5 b/l  2.   Corns b/l 5th digit 3.   Pes planus b/l 4.   Hammertoes b/l 5th digit 5.   NIDDM  Plan: 1. Discussed diabetic foot care principles and onychomycosis and treatment options.  Literature dispensed on today. 2. Toenails 1-5 b/l were debrided in length and girth without iatrogenic bleeding. Corn(s) pared bl 5th digits utilizing sterile scalpel blade without incident. 3. Patient to continue soft, supportive shoe gear daily. 4. Patient to report any pedal injuries to medical professional immediately. 5. Follow up 3 months.  6. Patient/POA to call should there be a concern in the interim.

## 2018-09-14 DIAGNOSIS — E113211 Type 2 diabetes mellitus with mild nonproliferative diabetic retinopathy with macular edema, right eye: Secondary | ICD-10-CM | POA: Insufficient documentation

## 2018-10-25 ENCOUNTER — Other Ambulatory Visit: Payer: Medicare Other

## 2018-11-13 ENCOUNTER — Ambulatory Visit: Payer: Medicare Other | Admitting: Cardiovascular Disease

## 2018-11-14 DIAGNOSIS — H26492 Other secondary cataract, left eye: Secondary | ICD-10-CM | POA: Insufficient documentation

## 2019-02-21 ENCOUNTER — Ambulatory Visit: Payer: Medicare Other | Admitting: Neurology

## 2019-02-22 ENCOUNTER — Other Ambulatory Visit: Payer: Self-pay

## 2019-02-22 ENCOUNTER — Ambulatory Visit: Payer: Medicare Other | Admitting: Neurology

## 2019-02-22 ENCOUNTER — Encounter: Payer: Self-pay | Admitting: Neurology

## 2019-02-22 VITALS — BP 141/77 | HR 60 | Temp 96.2°F | Ht 63.0 in | Wt 255.0 lb

## 2019-02-22 DIAGNOSIS — R202 Paresthesia of skin: Secondary | ICD-10-CM

## 2019-02-22 DIAGNOSIS — M545 Low back pain, unspecified: Secondary | ICD-10-CM

## 2019-02-22 DIAGNOSIS — G8929 Other chronic pain: Secondary | ICD-10-CM

## 2019-02-22 MED ORDER — DULOXETINE HCL 60 MG PO CPEP: 60.0000 mg | ORAL_CAPSULE | Freq: Every day | ORAL | 11 refills | Status: DC
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
## 2019-02-27 ENCOUNTER — Telehealth: Payer: Self-pay | Admitting: Neurology

## 2019-02-27 MED ORDER — GABAPENTIN 300 MG PO CAPS: 300.0000 mg | ORAL_CAPSULE | Freq: Three times a day (TID) | ORAL | 11 refills | Status: DC

## 2019-02-27 NOTE — Addendum Note (Signed)
Addended by: Noberto Retort C on: 02/27/2019 06:01 PM   Modules accepted: Orders

## 2019-02-27 NOTE — Telephone Encounter (Signed)
I returned the call to the patient.  She is agreeable to try gabapentin.  Instructed her to start slow and titrate up as tolerated to TID dosing.  She verbalized understanding.

## 2019-02-27 NOTE — Telephone Encounter (Signed)
Pt called stating that she can not take DULoxetine (CYMBALTA) 60 MG capsule and is needing to discuss with RN about this. Please advise.

## 2019-02-27 NOTE — Telephone Encounter (Signed)
I spoke to the patient and she does not feel comfortable taking duloxetine due to her glaucoma.  She is asking for an alternate medication option.

## 2019-02-27 NOTE — Telephone Encounter (Signed)
I have called in gabapentin 300mg tid 

## 2019-03-26 ENCOUNTER — Ambulatory Visit
Admission: RE | Admit: 2019-03-26 | Discharge: 2019-03-26 | Disposition: A | Discharge status: Home / Self Care | Payer: Medicare Other | Source: Ambulatory Visit | Attending: Neurology | Admitting: Neurology

## 2019-03-26 ENCOUNTER — Ambulatory Visit: Payer: Medicare Other | Admitting: Physical Therapy

## 2019-03-26 ENCOUNTER — Other Ambulatory Visit: Payer: Self-pay

## 2019-03-26 DIAGNOSIS — M545 Low back pain, unspecified: Secondary | ICD-10-CM

## 2019-03-26 DIAGNOSIS — G8929 Other chronic pain: Secondary | ICD-10-CM

## 2019-03-26 DIAGNOSIS — R202 Paresthesia of skin: Secondary | ICD-10-CM

## 2019-03-27 ENCOUNTER — Ambulatory Visit: Payer: Medicare Other | Attending: Neurology | Admitting: Physical Therapy

## 2019-03-27 ENCOUNTER — Encounter: Payer: Self-pay | Admitting: Physical Therapy

## 2019-03-27 ENCOUNTER — Other Ambulatory Visit: Payer: Self-pay

## 2019-03-27 DIAGNOSIS — G8929 Other chronic pain: Secondary | ICD-10-CM | POA: Diagnosis present

## 2019-03-27 DIAGNOSIS — M545 Low back pain, unspecified: Secondary | ICD-10-CM
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
## 2019-03-29 ENCOUNTER — Telehealth: Payer: Self-pay | Admitting: *Deleted

## 2019-03-29 NOTE — Telephone Encounter (Signed)
Results sent from Dr. Epimenio Foot:  The lumbar MRI showed some disc bulges and misalignment at L4L5 (also seen on her lumbar x-ray) but no nerve root compression.   I called the patient and she verbalized understanding of these results.

## 2019-04-06 ENCOUNTER — Ambulatory Visit: Payer: Medicare Other | Admitting: Physical Therapy

## 2019-04-11 ENCOUNTER — Encounter: Payer: Medicare Other | Admitting: Neurology

## 2019-04-12 ENCOUNTER — Ambulatory Visit: Payer: Medicare Other | Admitting: Physical Therapy

## 2019-04-17 ENCOUNTER — Ambulatory Visit: Payer: Medicare Other | Admitting: Physical Therapy

## 2019-04-19 ENCOUNTER — Ambulatory Visit: Payer: Medicare Other | Admitting: Physical Therapy

## 2019-04-24 ENCOUNTER — Ambulatory Visit: Payer: Medicare Other | Admitting: Physical Therapy

## 2019-04-25 ENCOUNTER — Emergency Department (HOSPITAL_COMMUNITY): Payer: Medicare Other

## 2019-04-25 ENCOUNTER — Emergency Department (HOSPITAL_COMMUNITY)
Admission: EM | Admit: 2019-04-25 | Discharge: 2019-04-25 | Disposition: A | Discharge status: Home / Self Care | Payer: Medicare Other | Attending: Emergency Medicine | Admitting: Emergency Medicine

## 2019-04-25 ENCOUNTER — Encounter (HOSPITAL_COMMUNITY): Payer: Self-pay | Admitting: Family Medicine

## 2019-04-25 ENCOUNTER — Other Ambulatory Visit: Payer: Self-pay

## 2019-04-25 DIAGNOSIS — R079 Chest pain, unspecified: Secondary | ICD-10-CM | POA: Diagnosis present

## 2019-04-25 DIAGNOSIS — E119 Type 2 diabetes mellitus without complications: Secondary | ICD-10-CM | POA: Insufficient documentation

## 2019-04-25 DIAGNOSIS — I10 Essential (primary) hypertension: Secondary | ICD-10-CM | POA: Diagnosis not present

## 2019-04-25 DIAGNOSIS — Z79899 Other long term (current) drug therapy: Secondary | ICD-10-CM | POA: Insufficient documentation

## 2019-04-25 DIAGNOSIS — R0602 Shortness of breath: Secondary | ICD-10-CM | POA: Insufficient documentation

## 2019-04-25 DIAGNOSIS — Z87891 Personal history of nicotine dependence: Secondary | ICD-10-CM | POA: Diagnosis not present

## 2019-04-25 DIAGNOSIS — J449 Chronic obstructive pulmonary disease, unspecified: Secondary | ICD-10-CM | POA: Insufficient documentation

## 2019-04-25 DIAGNOSIS — R0789 Other chest pain: Secondary | ICD-10-CM | POA: Diagnosis not present

## 2019-04-25 DIAGNOSIS — Z7984 Long term (current) use of oral hypoglycemic drugs: Secondary | ICD-10-CM | POA: Diagnosis not present

## 2019-04-25 LAB — HEPATIC FUNCTION PANEL
ALT: 22 U/L (ref 0–44)
AST: 18 U/L (ref 15–41)
Albumin: 4.2 g/dL (ref 3.5–5.0)
Alkaline Phosphatase: 102 U/L (ref 38–126)
Bilirubin, Direct: 0.1 mg/dL (ref 0.0–0.2)
Indirect Bilirubin: 0.5 mg/dL (ref 0.3–0.9)
Total Bilirubin: 0.6 mg/dL (ref 0.3–1.2)
Total Protein: 7.6 g/dL (ref 6.5–8.1)

## 2019-04-25 LAB — CBC
HCT: 38.6 % (ref 36.0–46.0)
Hemoglobin: 12.1 g/dL (ref 12.0–15.0)
MCH: 27.2 pg (ref 26.0–34.0)
MCHC: 31.3 g/dL (ref 30.0–36.0)
MCV: 86.7 fL (ref 80.0–100.0)
Platelets: 191 10*3/uL (ref 150–400)
RBC: 4.45 MIL/uL (ref 3.87–5.11)
RDW: 14.8 % (ref 11.5–15.5)
WBC: 5.5 10*3/uL (ref 4.0–10.5)
nRBC: 0 % (ref 0.0–0.2)

## 2019-04-25 LAB — TROPONIN I (HIGH SENSITIVITY)
Troponin I (High Sensitivity): 2 ng/L (ref <18)
Troponin I (High Sensitivity): 2 ng/L (ref <18)

## 2019-04-25 LAB — BASIC METABOLIC PANEL
Anion gap: 8 (ref 5–15)
BUN: 13 mg/dL (ref 8–23)
CO2: 26 mmol/L (ref 22–32)
Calcium: 9 mg/dL (ref 8.9–10.3)
Chloride: 105 mmol/L (ref 98–111)
Creatinine, Ser: 0.58 mg/dL (ref 0.44–1.00)
GFR calc Af Amer: >60 mL/min (ref >60)
GFR calc non Af Amer: >60 mL/min (ref >60)
Glucose, Bld: 124 mg/dL — ABNORMAL HIGH (ref 70–99)
Potassium: 3.9 mmol/L (ref 3.5–5.1)
Sodium: 139 mmol/L (ref 135–145)

## 2019-04-25 LAB — D-DIMER, QUANTITATIVE: D-Dimer, Quant: <0.27 ug/mL-FEU (ref 0.00–0.50)

## 2019-04-25 LAB — BRAIN NATRIURETIC PEPTIDE: B Natriuretic Peptide: 62.9 pg/mL (ref 0.0–100.0)

## 2019-04-25 MED ORDER — SODIUM CHLORIDE 0.9% FLUSH
3.0000 mL | Freq: Once | INTRAVENOUS | Status: AC
  Administered 2019-04-25: 3 mL via INTRAVENOUS

## 2019-04-25 NOTE — Discharge Instructions (Addendum)
Make an appointment with [facility] cardiology.  And stop taking your inflammation medicine.  If possible follow-up with your primary care doctor for your blood pressure but you can also discuss that with the cardiologist

## 2019-04-25 NOTE — ED Provider Notes (Signed)
[facility]-EMERGENCY DEPT Provider Note   CSN: [id number] Arrival date & time: 04/25/19  1506     History Chief Complaint  Patient presents with  . Chest Pain    Kylie Wilson is a 69 y.o. female.  Patient complains of occasional chest discomfort.  The pain is not worse with exertion.  The history is provided by the patient. No language interpreter was used.  Chest Pain Pain location:  L chest Pain quality: aching   Pain radiates to:  Neck Pain severity:  Mild Onset quality:  Sudden Timing:  Intermittent Progression:  Waxing and waning Chronicity:  New Context: not breathing   Associated symptoms: no abdominal pain, no back pain, no cough, no fatigue and no headache        Past Medical History:  Diagnosis Date  . Arthritis   . Barrett's esophagus   . Burning pain   . Cervical radiculopathy   . Cervical spondylosis   . DDD (degenerative disc disease), cervical   . Diabetes mellitus    type 2   . GERD (gastroesophageal reflux disease)   . Hyperlipidemia   . Hypertension   . Obesity   . Rectocele   . Sleep apnea    does not tolerate cpap  . Uterine prolapse     Patient Active Problem List   Diagnosis Date Noted  . Chronic low back pain without sciatica 02/22/2019  . Paresthesia 02/22/2019  . 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 Lumbar disc disease 08/17/2018  . Wheezing 08/01/2017  . Diverticulosis of large intestine without hemorrhage 05/06/2017  . History of adenomatous polyp of colon 04/25/2017  . Former smoker 03/04/2017  . Vocal cord polyp 01/13/2017  . Vitamin D deficiency 10/15/2016  . Refused pneumococcal vaccination 10/13/2016  . Generalized osteoarthritis of multiple sites 07/16/2016  . Low back pain at multiple sites 07/16/2016  . Cervicalgia 07/16/2016  . Chronic pain disorder 07/16/2016  . COPD (chronic obstructive pulmonary disease) with  chronic bronchitis (HCC) 04/11/2016  . 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 COPD (chronic obstructive pulmonary disease) (HCC) 02/17/2016  . Obesity due to excess calories 02/17/2016  . Chronic idiopathic constipation 03/20/2015  . Morbid obesity with BMI of 45.0-49.9, adult (HCC) 03/20/2015  . Unspecified constipation 09/22/2011  . Background diabetic retinopathy (HCC) 09/09/2011  . Exudative macular degeneration (HCC) 09/09/2011  . HYPERLIPIDEMIA 08/27/2009  . DEPRESSION 08/27/2009  . Essential hypertension 08/27/2009  . Sleep apnea 08/27/2009  . DYSPNEA 08/27/2009  . Major depressive disorder, single episode 08/27/2009  . BARRETTS ESOPHAGUS 08/07/2009  . ESOPHAGEAL REFLUX 08/07/2008  . COUGH 08/07/2008  . DIVERTICULOSIS OF COLON 11/17/2007    Past Surgical History:  Procedure Laterality Date  . BREAST BIOPSY  1980   left  . BREAST EXCISIONAL BIOPSY Left    Bening 1980's  . SCAR REVISION  2001   both legs  . TOE SURGERY  1981   4th and 5th toe right foot  . TOE SURGERY  2002   great toe left  . TONSILLECTOMY    . TUBAL LIGATION       OB History   No obstetric history on file.     Family History  Problem Relation Age of Onset  . Heart disease Father   . Pancreatic cancer Father   . Breast cancer Paternal Aunt   . Stroke Mother   .  Heart disease Brother   . Colon cancer Neg Hx   . Stomach cancer Neg Hx   . Colon polyps Neg Hx   . Rectal cancer Neg Hx     Social History   Tobacco Use  . Smoking status: Former Smoker    Packs/day: 0.80    Years: 30.00    Pack years: 24.00    Types: Cigarettes    Quit date: 09/15/2011    Years since quitting: 7.6  . Smokeless tobacco: Never Used  . Tobacco comment: smoked 20 years; then quit 10; smoked 30 years plus  Substance Use Topics  . Alcohol use: Yes    Comment: Every evening, 3 glasses of wine   . Drug use: No      Home Medications Prior to Admission medications   Medication Sig Start Date End Date Taking? Authorizing Provider  amlodipine-olmesartan (AZOR) 10-20 MG tablet Take 1 tablet by mouth daily. 02/06/19  Yes [provider]  Ascorbic Acid (VITAMIN C) 1000 MG tablet Take 1,000 mg by mouth daily.   Yes [provider]  BIOTIN PO Take 1 tablet by mouth daily.   Yes [provider]  Cholecalciferol (VITAMIN D3 PO) Take 5,000 Units by mouth daily.   Yes [provider]  Diclofenac-miSOPROStol 50-0.2 MG TBEC Take 1 tablet by mouth 2 (two) times daily. 04/13/19  Yes [provider]  dorzolamide-timolol (COSOPT) 22.3-6.8 MG/ML ophthalmic solution Place 1 drop into both eyes 2 (two) times daily. 02/07/19  Yes [provider]  LINZESS 72 MCG capsule Take 72 mcg by mouth daily as needed (abdominal cramping).  03/13/19  Yes [provider]  MAGNESIUM PO Take 250 mg by mouth daily.   Yes [provider]  metFORMIN (GLUCOPHAGE) 500 MG tablet Take 1,000 mg by mouth 2 (two) times daily with a meal.  09/14/18  Yes [provider]  Multiple Vitamin (MULTIVITAMIN) tablet Take 1 tablet by mouth daily.   Yes [provider]  Netarsudil-Latanoprost (ROCKLATAN) 0.02-0.005 % SOLN Place 1 drop into both eyes at bedtime.  10/24/18  Yes [provider]  rosuvastatin (CRESTOR) 10 MG tablet Take 1 tablet by mouth daily. 09/14/18  Yes [provider]  ACCU-CHEK AVIVA PLUS test strip USE TO TEST BLOOD SUGARS DAILY. 05/27/17   [place], Betty G, MD  Blood Glucose Monitoring Suppl (GLUCOCOM BLOOD GLUCOSE MONITOR) DEVI One Touch Verio Meter. FSBS BID. E11.319 06/06/17   [provider]  gabapentin (NEURONTIN) 300 MG capsule Take 1 capsule (300 mg total) by mouth 3 (three) times daily. Patient not taking: Reported on 04/25/2019 02/27/19   Levert Feinstein, MD  TRULICITY 1.5 MG/0.5ML SOPN Inject 1.5 mg into the skin once a week.  03/21/19   [provider]    Allergies    Contrast media [iodinated diagnostic agents] and Lisinopril  Review of Systems   Review of Systems  Constitutional: Negative for appetite change and fatigue.  HENT: Negative for congestion, ear discharge and sinus pressure.   Eyes: Negative for discharge.  Respiratory: Negative for cough.   Cardiovascular: Positive for chest pain.  Gastrointestinal: Negative for abdominal pain and diarrhea.  Genitourinary: Negative for frequency and hematuria.  Musculoskeletal: Negative for back pain.  Skin: Negative for rash.  Neurological: Negative for seizures and headaches.  Psychiatric/Behavioral: Negative for hallucinations.    Physical Exam Updated Vital Signs BP (!) 180/77 (BP Location: Right Arm)   Pulse 69   Temp 98 F (36.7 C) (Oral)   Resp 15  Ht 5\' 3"  (1.6 m)   Wt 118.4 kg   SpO2 99%   BMI 46.23 kg/m   Physical Exam Vitals and nursing note reviewed.  Constitutional:      Appearance: She is well-developed.  HENT:     Head: Normocephalic.     Nose: Nose normal.  Eyes:     General: No scleral icterus.    Conjunctiva/sclera: Conjunctivae normal.  Neck:     Thyroid: No thyromegaly.  Cardiovascular:     Rate and Rhythm: Normal rate and regular rhythm.     Heart sounds: No murmur. No friction rub. No gallop.   Pulmonary:     Breath sounds: No stridor. No wheezing or rales.  Chest:     Chest wall: No tenderness.  Abdominal:     General: There is no distension.     Tenderness: There is no abdominal tenderness. There is no rebound.  Musculoskeletal:        General: Normal range of motion.     Cervical back: Neck supple.  Lymphadenopathy:     Cervical: No cervical adenopathy.  Skin:    Findings: No erythema or rash.  Neurological:     Mental Status: She is oriented to person, place, and time.     Motor: No abnormal muscle tone.     Coordination: Coordination normal.  Psychiatric:        Behavior: Behavior normal.       ED Results / Procedures / Treatments   Labs (all labs ordered are listed, but only abnormal results are displayed) Labs Reviewed  BASIC METABOLIC PANEL - Abnormal; Notable for the following components:      Result Value   Glucose, Bld 124 (*)    All other components within normal limits  CBC  HEPATIC FUNCTION PANEL  BRAIN NATRIURETIC PEPTIDE  D-DIMER, QUANTITATIVE (NOT AT [facility])  TROPONIN I (HIGH SENSITIVITY)  TROPONIN I (HIGH SENSITIVITY)    EKG EKG Interpretation  Date/Time:  Wednesday April 25 2019 15:12:04 EST Ventricular Rate:  61 PR Interval:    QRS Duration: 91 QT Interval:  377 QTC Calculation: 380 R Axis:   65 Text Interpretation: Sinus rhythm Borderline repolarization abnormality Baseline wander in lead(s) V1 Otherwise within normal limits Confirmed by 05-24-1991 [id number]) on 04/25/2019 3:50:57 PM Also confirmed by 06/23/2019 [phone])  on 04/25/2019 3:59:24 PM Also confirmed by 06/23/2019 [phone])  on 04/25/2019 7:14:44 PM   Radiology DG Chest 2 View  Result Date: 04/25/2019 CLINICAL DATA:  Intermittent left-sided chest pain radiating to left arm, began 3 days ago EXAM: CHEST - 2 VIEW COMPARISON:  12/19/2018 FINDINGS: The heart size and mediastinal contours are within normal limits. Both lungs are clear. The visualized skeletal structures are unremarkable. IMPRESSION: No active cardiopulmonary disease. Electronically Signed   By: 02/18/2019 M.D.   On: 04/25/2019 15:56    Procedures Procedures (including critical care time)  Medications Ordered in ED Medications  sodium chloride flush (NS) 0.9 % injection 3 mL (3 mLs Intravenous Given 04/25/19 1623)    ED Course  I have reviewed the triage vital signs and the nursing notes.  Pertinent labs & imaging results that were available during my care of the patient were reviewed by me and considered in my medical decision making (see chart for details).    MDM Rules/Calculators/A&P                       Troponin x2 is negative.  Chest  x-ray unremarkable.  EKG shows no acute changes.  Patient with atypical chest discomfort.  She was offered admission to the hospital with further evaluation for the chest discomfort but the patient decided she would rather go home.  Patient was referred to cardiology and told to stop taking her anti-inflammatory medicine Final Clinical Impression(s) / ED Diagnoses Final diagnoses:  Atypical chest pain    Rx / DC Orders ED Discharge Orders    None       Milton Ferguson, MD 04/25/19 Sharilyn Sites

## 2019-04-25 NOTE — ED Notes (Signed)
Patient was verbalized discharge instructions. Pt had no further questions at this  Time. NAD.

## 2019-04-25 NOTE — ED Triage Notes (Signed)
Patient is complaining of left sided chest pain that radiates to her left arm and last night radiated to left face and jaw that started 3 days ago while washing dishes. Pain described as pressure and intermittent. Associated symptoms of shortness of breath, dizziness, and nausea. Patient has belched while triaging.

## 2019-04-26 ENCOUNTER — Ambulatory Visit: Payer: Medicare Other | Admitting: Physical Therapy

## 2019-04-27 ENCOUNTER — Ambulatory Visit (INDEPENDENT_AMBULATORY_CARE_PROVIDER_SITE_OTHER): Payer: Medicare Other | Admitting: Cardiology

## 2019-04-27 ENCOUNTER — Encounter: Payer: Self-pay | Admitting: Cardiology

## 2019-04-27 ENCOUNTER — Other Ambulatory Visit: Payer: Self-pay

## 2019-04-27 VITALS — BP 186/85 | HR 74 | Ht 63.0 in | Wt 261.0 lb

## 2019-04-27 DIAGNOSIS — R079 Chest pain, unspecified: Secondary | ICD-10-CM | POA: Diagnosis not present

## 2019-04-27 DIAGNOSIS — K219 Gastro-esophageal reflux disease without esophagitis: Secondary | ICD-10-CM

## 2019-04-27 DIAGNOSIS — I10 Essential (primary) hypertension: Secondary | ICD-10-CM

## 2019-04-27 DIAGNOSIS — E785 Hyperlipidemia, unspecified: Secondary | ICD-10-CM

## 2019-04-27 DIAGNOSIS — E113293 Type 2 diabetes mellitus with mild nonproliferative diabetic retinopathy without macular edema, bilateral: Secondary | ICD-10-CM

## 2019-04-27 DIAGNOSIS — G4733 Obstructive sleep apnea (adult) (pediatric): Secondary | ICD-10-CM | POA: Diagnosis not present

## 2019-04-27 DIAGNOSIS — Z6841 Body Mass Index (BMI) 40.0 and over, adult: Secondary | ICD-10-CM

## 2019-04-27 MED ORDER — PANTOPRAZOLE SODIUM 40 MG PO TBEC: 40.0000 mg | DELAYED_RELEASE_TABLET | Freq: Every day | ORAL | 11 refills | Status: DC

## 2019-04-27 MED ORDER — HYDROCHLOROTHIAZIDE 12.5 MG PO CAPS: 12.5000 mg | ORAL_CAPSULE | Freq: Every day | ORAL | 1 refills | Status: DC

## 2019-04-27 NOTE — Progress Notes (Signed)
Cardiology Office Note:    Date:  04/27/2019   ID:  Kylie Wilson, DOB 06/02/1950, MRN [id number]  PCP:  Patient, No Pcp Per  Cardiologist:  Chilton Si, MD  Electrophysiologist:  None   Referring MD: Verlon Au, MD   CC; pt seen today as a post ED follow up  History of Present Illness:    Kylie Wilson is a 69 y.o. female with a hx of morbid obesity, sleep apnea, not on CPAP, dyslipidemia, hypertension, non-insulin-dependent diabetes, and prior coronary calcification seen on CT scan.  She was seen initially by cardiology in 2018 for chest pain.  Troponin was negative.  CT scan revealed atherosclerosis of the coronaries.  Myoview 7/18 showed no ischemia and normal LV function.  It was felt her symptoms were secondary to GERD and not coronary disease.  We last saw her in December 2018.  Patient was seen in the emergency room 04/25/2019.  She initially presented to urgent care with multiple complaints.  She had a headache, left facial numbness, nausea, bloating, belching, and fatigue.  She also admitted some chest discomfort but it is hard for her to describe.  She read on the Internet that frequently females have atypical symptoms of coronary disease.  She knew she had "some blockages" by her prior evaluation.  She was sent from the urgent care to the emergency room.  Her EKG shows no acute changes.  Her troponin was negative x2 although the patient read the result as "high-sensitivity troponin" and thought it was positive.  She is seen today for further evaluation.  I could not get a clear history of exertional angina.  She did tell me that her blood pressure has been elevated recently.  She was taken off her nonsteroidal anti-inflammatory but it remains high.  She denies any orthopnea.  She has trace lower extremity edema.  She has frequent belching and bloating, in the office she was belching frequently.  Past Medical History:  Diagnosis Date  . Arthritis   .  Barrett's esophagus   . Burning pain   . Cervical radiculopathy   . Cervical spondylosis   . DDD (degenerative disc disease), cervical   . Diabetes mellitus    type 2   . GERD (gastroesophageal reflux disease)   . Hyperlipidemia   . Hypertension   . Obesity   . Rectocele   . Sleep apnea    does not tolerate cpap  . Uterine prolapse     Past Surgical History:  Procedure Laterality Date  . BREAST BIOPSY  1980   left  . BREAST EXCISIONAL BIOPSY Left    Bening 1980's  . SCAR REVISION  2001   both legs  . TOE SURGERY  1981   4th and 5th toe right foot  . TOE SURGERY  2002   great toe left  . TONSILLECTOMY    . TUBAL LIGATION      Current Medications: Current Meds  Medication Sig  . ACCU-CHEK AVIVA PLUS test strip USE TO TEST BLOOD SUGARS DAILY.  Marland Kitchen amlodipine-olmesartan (AZOR) 10-20 MG tablet Take 1 tablet by mouth daily.  . Ascorbic Acid (VITAMIN C) 1000 MG tablet Take 1,000 mg by mouth daily.  Marland Kitchen BIOTIN PO Take 1 tablet by mouth daily.  . Blood Glucose Monitoring Suppl (GLUCOCOM BLOOD GLUCOSE MONITOR) DEVI One Touch Verio Meter. FSBS BID. E11.319  . Cholecalciferol (VITAMIN D3 PO) Take 5,000 Units by mouth daily.  . Diclofenac-miSOPROStol 50-0.2 MG TBEC Take 1 tablet by  mouth 2 (two) times daily.  . dorzolamide-timolol (COSOPT) 22.3-6.8 MG/ML ophthalmic solution Place 1 drop into both eyes 2 (two) times daily.  Marland Kitchen LINZESS 72 MCG capsule Take 72 mcg by mouth daily as needed (abdominal cramping).   Marland Kitchen MAGNESIUM PO Take 250 mg by mouth daily.  . metFORMIN (GLUCOPHAGE) 500 MG tablet Take 1,000 mg by mouth 2 (two) times daily with a meal.   . Multiple Vitamin (MULTIVITAMIN) tablet Take 1 tablet by mouth daily.  . Netarsudil-Latanoprost (ROCKLATAN) 0.02-0.005 % SOLN Place 1 drop into both eyes at bedtime.   . rosuvastatin (CRESTOR) 10 MG tablet Take 1 tablet by mouth daily.  . TRULICITY 1.5 MG/0.5ML SOPN Inject 1.5 mg into the skin once a week.  . [DISCONTINUED] gabapentin  (NEURONTIN) 300 MG capsule Take 1 capsule (300 mg total) by mouth 3 (three) times daily.     Allergies:   Lisinopril   Social History   Socioeconomic History  . Marital status: Widowed    Spouse name: Not on file  . Number of children: 3  . Years of education: 8th grade  . Highest education level: Not on file  Occupational History  . Occupation: Retired    Associate Professor: UNEMPLOYED  Tobacco Use  . Smoking status: Former Smoker    Packs/day: 0.80    Years: 30.00    Pack years: 24.00    Types: Cigarettes    Quit date: 09/15/2011    Years since quitting: 7.6  . Smokeless tobacco: Never Used  . Tobacco comment: smoked 20 years; then quit 10; smoked 30 years plus  Substance and Sexual Activity  . Alcohol use: Yes    Comment: Every evening, 3 glasses of wine   . Drug use: No  . Sexual activity: Not on file  Other Topics Concern  . Not on file  Social History Narrative   Lives at home alone.   Right-handed.   One cup caffeine per day.   Social Determinants of Health   Financial Resource Strain:   . Difficulty of Paying Living Expenses: Not on file  Food Insecurity:   . Worried About Programme researcher, broadcasting/film/video in the Last Year: Not on file  . Ran Out of Food in the Last Year: Not on file  Transportation Needs:   . Lack of Transportation (Medical): Not on file  . Lack of Transportation (Non-Medical): Not on file  Physical Activity:   . Days of Exercise per Week: Not on file  . Minutes of Exercise per Session: Not on file  Stress:   . Feeling of Stress : Not on file  Social Connections:   . Frequency of Communication with Friends and Family: Not on file  . Frequency of Social Gatherings with Friends and Family: Not on file  . Attends Religious Services: Not on file  . Active Member of Clubs or Organizations: Not on file  . Attends Banker Meetings: Not on file  . Marital Status: Not on file     Family History: The patient's family history includes Breast cancer in  her paternal aunt; Heart disease in her brother and father; Pancreatic cancer in her father; Stroke in her mother. There is no history of Colon cancer, Stomach cancer, Colon polyps, or Rectal cancer.  ROS:   Please see the history of present illness.     All other systems reviewed and are negative.  EKGs/Labs/Other Studies Reviewed:    The following studies were reviewed today: Myoview 2018  EKG:  EKG  is not ordered today.  The ekg ordered 04/25/2019 demonstrates NSR, HR 60, no acute changes.   Recent Labs: 04/25/2019: ALT 22; B Natriuretic Peptide 62.9; BUN 13; Creatinine, Ser 0.58; Hemoglobin 12.1; Platelets 191; Potassium 3.9; Sodium 139  Recent Lipid Panel    Component Value Date/Time   CHOL 197 06/04/2016 1114   TRIG 73.0 06/04/2016 1114   HDL 56.00 06/04/2016 1114   CHOLHDL 4 06/04/2016 1114   VLDL 14.6 06/04/2016 1114   LDLCALC 126 (H) 06/04/2016 1114    Physical Exam:    VS:  BP (!) 186/85   Pulse 74   Ht 5\' 3"  (1.6 m)   Wt 261 lb (118.4 kg)   SpO2 100%   BMI 46.23 kg/m     Wt Readings from Last 3 Encounters:  04/27/19 261 lb (118.4 kg)  04/25/19 261 lb (118.4 kg)  02/22/19 255 lb (115.7 kg)     GEN: Pleasant obese AA female,  well developed in no acute distress HEENT: Normal NECK: No JVD; No carotid bruits CARDIAC: RRR, no murmurs, rubs, gallops RESPIRATORY:  Clear to auscultation without rales, wheezing or rhonchi  ABDOMEN: Obese, Soft, non-tender, non-distended MUSCULOSKELETAL:  trace edema; No deformity  SKIN: Warm and dry NEUROLOGIC:  Alert and oriented x 3 PSYCHIATRIC:  Normal affect   ASSESSMENT:    Chest pain with moderate risk of acute coronary syndrome Symptoms not classic but she has multiple risk factors and know coronary Ca++- check Lexiscan.   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  Add Protonix 40 mg daily for belching and bloating symptoms.  Check Lexiscan Myoview.  F/U after the above.    Medication Adjustments/Labs and Tests Ordered: Current medicines are reviewed at length with the patient today.  Concerns regarding medicines are outlined above.  Orders Placed This Encounter  Procedures  . MYOCARDIAL PERFUSION IMAGING   Meds ordered this encounter  Medications  . hydrochlorothiazide (MICROZIDE) 12.5 MG capsule    Sig: Take 1 capsule (12.5 mg total) by mouth daily.    Dispense:  90 capsule    Refill:  1  . pantoprazole (PROTONIX) 40 MG tablet    Sig: Take 1 tablet (40 mg total) by mouth daily.    Dispense:  30 tablet    Refill:  11    Patient Instructions  Medication Instructions:  START Protonix 40mg  Take 1 tablet every morning  START Hydrochlorothiazide 12.5mg  Take 1 tablet once a day  *If you need a refill on your cardiac medications before your next appointment, please call your pharmacy*  Lab Work: None  If you have labs (blood work) drawn today and your tests are completely normal, you will receive your results only by: Arlyce Dice MyChart Message (if you have MyChart) OR . A paper copy in  the mail If you have any lab test that is abnormal or we need to change your treatment, we will call you to review the results.  Testing/Procedures: Your physician has requested that you have a  lexiscan myoview. For further information please visit [email or web address]. Please follow instruction sheet, as given. PLEASE SCHEDULE TEST FOR ONE DAY NEXT WEEK NO MEDS TO HOLD  Follow-Up: At [facility], you and your health needs are our priority.  As part of our continuing mission to provide you with exceptional heart care, we have created designated Provider Care Teams.  These Care Teams include your primary Cardiologist (physician) and Advanced Practice Providers (APPs -  Physician Assistants and Nurse Practitioners) who all work together to provide you with the care you need, when you need it.  Your next appointment:   2-3 week(s)  The format for your next appointment:   In Person  Provider:   Kerin Ransom, PA-C  Other Instructions     Signed, Kerin Ransom, PA-C  04/27/2019 10:12 AM    [place]

## 2019-04-27 NOTE — Assessment & Plan Note (Signed)
She had problems with myalgias in the past- currently on Crestor 10 mg

## 2019-04-27 NOTE — Assessment & Plan Note (Signed)
Symptoms not classic but she has multiple risk factors and know coronary Ca++- check Lexiscan.

## 2019-04-27 NOTE — Assessment & Plan Note (Signed)
Significant obesity complicated by untreated sleep apnea and DJD

## 2019-04-27 NOTE — Assessment & Plan Note (Signed)
Prior documented gastritis and Barrett's esophagus in 2013 (Dr Arlyce Dice) Add Protonix

## 2019-04-27 NOTE — Assessment & Plan Note (Addendum)
On oral agents- followed by Dr Althiemer

## 2019-04-27 NOTE — Patient Instructions (Signed)
Medication Instructions:  START Protonix 40mg  Take 1 tablet every morning  START Hydrochlorothiazide 12.5mg  Take 1 tablet once a day  *If you need a refill on your cardiac medications before your next appointment, please call your pharmacy*  Lab Work: None  If you have labs (blood work) drawn today and your tests are completely normal, you will receive your results only by: MyChart Message (if you have MyChart) OR . A paper copy in the mail If you have any lab test that is abnormal or we need to change your treatment, we will call you to review the results.  Testing/Procedures: Your physician has requested that you have a lexiscan myoview. For further information please visit Marland Kitchen. Please follow instruction sheet, as given. PLEASE SCHEDULE TEST FOR ONE DAY NEXT WEEK NO MEDS TO HOLD  Follow-Up: At [facility], you and your health needs are our priority.  As part of our continuing mission to provide you with exceptional heart care, we have created designated Provider Care Teams.  These Care Teams include your primary Cardiologist (physician) and Advanced Practice Providers (APPs -  Physician Assistants and Nurse Practitioners) who all work together to provide you with the care you need, when you need it.  Your next appointment:   2-3 week(s)  The format for your next appointment:   In Person  Provider:   [facility], PA-C  Other Instructions

## 2019-04-27 NOTE — Assessment & Plan Note (Signed)
Positive sleep study but intolerant to C-pap

## 2019-04-27 NOTE — Assessment & Plan Note (Signed)
Poor control- add HCTZ, HR seems too low for beta blcoker

## 2019-05-01 ENCOUNTER — Ambulatory Visit: Payer: Medicare Other | Admitting: Physical Therapy

## 2019-05-03 ENCOUNTER — Ambulatory Visit: Payer: Medicare Other | Admitting: Physical Therapy

## 2019-05-04 ENCOUNTER — Telehealth (HOSPITAL_COMMUNITY): Payer: Self-pay | Admitting: *Deleted

## 2019-05-04 NOTE — Telephone Encounter (Signed)
Close encounter 

## 2019-05-08 ENCOUNTER — Ambulatory Visit: Payer: Medicare Other | Admitting: Physical Therapy

## 2019-05-08 ENCOUNTER — Telehealth (HOSPITAL_COMMUNITY): Payer: Self-pay

## 2019-05-08 NOTE — Telephone Encounter (Signed)
Encounter complete. 

## 2019-05-09 ENCOUNTER — Other Ambulatory Visit: Payer: Self-pay

## 2019-05-09 ENCOUNTER — Ambulatory Visit (HOSPITAL_COMMUNITY)
Admission: RE | Admit: 2019-05-09 | Discharge: 2019-05-09 | Disposition: A | Discharge status: Home / Self Care | Payer: Medicare Other | Source: Ambulatory Visit | Attending: Cardiovascular Disease | Admitting: Cardiovascular Disease

## 2019-05-09 DIAGNOSIS — R079 Chest pain, unspecified: Secondary | ICD-10-CM | POA: Diagnosis present

## 2019-05-09 MED ORDER — TECHNETIUM TC 99M TETROFOSMIN IV KIT
30.6000 | PACK | Freq: Once | INTRAVENOUS | Status: AC | PRN
  Administered 2019-05-09: 30.6 via INTRAVENOUS
  Filled 2019-05-09: qty 31

## 2019-05-09 MED ORDER — REGADENOSON 0.4 MG/5ML IV SOLN
0.4000 mg | Freq: Once | INTRAVENOUS | Status: AC
  Administered 2019-05-09: 0.4 mg via INTRAVENOUS

## 2019-05-10 ENCOUNTER — Ambulatory Visit (HOSPITAL_COMMUNITY)
Admission: RE | Admit: 2019-05-10 | Discharge: 2019-05-10 | Disposition: A | Discharge status: Home / Self Care | Payer: Medicare Other | Source: Ambulatory Visit | Attending: Cardiology | Admitting: Cardiology

## 2019-05-10 ENCOUNTER — Ambulatory Visit: Payer: Medicare Other | Admitting: Physical Therapy

## 2019-05-10 LAB — MYOCARDIAL PERFUSION IMAGING
LV dias vol: 63 mL (ref 46–106)
LV sys vol: 19 mL
Peak HR: 95 {beats}/min
Rest HR: 63 {beats}/min
SDS: 1
SRS: 0
SSS: 1
TID: 0.71

## 2019-05-10 MED ORDER — TECHNETIUM TC 99M TETROFOSMIN IV KIT
31.1000 | PACK | Freq: Once | INTRAVENOUS | Status: AC | PRN
  Administered 2019-05-10: 31.1 via INTRAVENOUS

## 2019-05-22 ENCOUNTER — Encounter: Payer: Self-pay | Admitting: Cardiology

## 2019-05-22 ENCOUNTER — Encounter: Payer: Self-pay | Admitting: *Deleted

## 2019-05-22 ENCOUNTER — Ambulatory Visit (INDEPENDENT_AMBULATORY_CARE_PROVIDER_SITE_OTHER): Payer: Medicare Other | Admitting: Cardiology

## 2019-05-22 ENCOUNTER — Other Ambulatory Visit: Payer: Self-pay

## 2019-05-22 VITALS — BP 136/90 | HR 65 | Ht 64.0 in | Wt 262.0 lb

## 2019-05-22 DIAGNOSIS — Z6841 Body Mass Index (BMI) 40.0 and over, adult: Secondary | ICD-10-CM

## 2019-05-22 DIAGNOSIS — I10 Essential (primary) hypertension: Secondary | ICD-10-CM | POA: Diagnosis not present

## 2019-05-22 DIAGNOSIS — E785 Hyperlipidemia, unspecified: Secondary | ICD-10-CM | POA: Diagnosis not present

## 2019-05-22 DIAGNOSIS — E113293 Type 2 diabetes mellitus with mild nonproliferative diabetic retinopathy without macular edema, bilateral: Secondary | ICD-10-CM

## 2019-05-22 DIAGNOSIS — G4733 Obstructive sleep apnea (adult) (pediatric): Secondary | ICD-10-CM

## 2019-05-22 DIAGNOSIS — R5383 Other fatigue: Secondary | ICD-10-CM

## 2019-05-22 DIAGNOSIS — R002 Palpitations: Secondary | ICD-10-CM | POA: Diagnosis not present

## 2019-05-22 NOTE — Assessment & Plan Note (Signed)
Check TSH 

## 2019-05-22 NOTE — Progress Notes (Signed)
Patient ID: Kylie Wilson, female   DOB: 1950-11-07, 69 y.o.   MRN: [id number] Patiient enrolled for [employer] to mail a 14 day ZIO XT long term holter monitor to her home.

## 2019-05-22 NOTE — Patient Instructions (Addendum)
Medication Instructions:  Your physician recommends that you continue on your current medications as directed. Please refer to the Current Medication list given to you today.  *If you need a refill on your cardiac medications before your next appointment, please call your pharmacy*  Lab Work:  Your physician recommends that you return for lab work in: TODAY  TSH If you have labs (blood work) drawn today and your tests are completely normal, you will receive your results only by: Marland Kitchen MyChart Message (if you have MyChart) OR . A paper copy in the mail If you have any lab test that is abnormal or we need to change your treatment, we will call you to review the results.  Testing/Procedures: Christena Deem- Long Term Monitor Instructions   Your physician has requested you wear your ZIO patch monitor___14____days.   This is a single patch monitor.  [employer] supplies one patch monitor per enrollment.  Additional stickers are not available.   Please do not apply patch if you will be having a Nuclear Stress Test, Echocardiogram, Cardiac CT, MRI, or Chest Xray during the time frame you would be wearing the monitor. The patch cannot be worn during these tests.  You cannot remove and re-apply the ZIO XT patch monitor.   Your ZIO patch monitor will be sent USPS Priority mail from [facility] directly to your home address. The monitor may also be mailed to a PO BOX if home delivery is not available.   It may take 3-5 days to receive your monitor after you have been enrolled.   Once you have received you monitor, please review enclosed instructions.  Your monitor has already been registered assigning a specific monitor serial # to you.   Applying the monitor   Shave hair from upper left chest.   Hold abrader disc by orange tab.  Rub abrader in 40 strokes over left upper chest as indicated in your monitor instructions.   Clean area with 4 enclosed alcohol pads .  Use all pads to assure are is cleaned  thoroughly.  Let dry.   Apply patch as indicated in monitor instructions.  Patch will be place under collarbone on left side of chest with arrow pointing upward.   Rub patch adhesive wings for 2 minutes.Remove white label marked "1".  Remove white label marked "2".  Rub patch adhesive wings for 2 additional minutes.   While looking in a mirror, press and release button in center of patch.  A small green light will flash 3-4 times .  This will be your only indicator the monitor has been turned on.     Do not shower for the first 24 hours.  You may shower after the first 24 hours.   Press button if you feel a symptom. You will hear a small click.  Record Date, Time and Symptom in the Patient Log Book.   When you are ready to remove patch, follow instructions on last 2 pages of Patient Log Book.  Stick patch monitor onto last page of Patient Log Book.   Place Patient Log Book in [place] box.  Use locking tab on box and tape box closed securely.  The [employer] and [employer] has [employer] on it.  Please place in mailbox as soon as possible.  Your physician should have your test results approximately 7 days after the monitor has been mailed back to [facility]   Call [facility] Customer Care at [phone] if you have questions regarding your ZIO XT  patch monitor.  Call them immediately if you see an orange light blinking on your monitor.   If your monitor falls off in less than 4 days contact our Monitor department at [phone].  If your monitor becomes loose or falls off after 4 days call [employer] at [phone] for suggestions on securing your monitor.    Follow-Up: At [facility], you and your health needs are our priority.  As part of our continuing mission to provide you with exceptional heart care, we have created designated Provider Care Teams.  These Care Teams include your primary Cardiologist (physician) and Advanced Practice Providers (APPs -  Physician Assistants and  Nurse Practitioners) who all work together to provide you with the care you need, when you need it.  We recommend signing up for the patient portal called "MyChart".  Sign up information is provided on this After Visit Summary.  MyChart is used to connect with patients for Virtual Visits (Telemedicine).  Patients are able to view lab/test results, encounter notes, upcoming appointments, etc.  Non-urgent messages can be sent to your provider as well.   To learn more about what you can do with MyChart, go to [email or web address].    Your next appointment:   6 week(s)  The format for your next appointment:   In Person  Provider:   Skeet Latch, MD  Other Instructions

## 2019-05-22 NOTE — Progress Notes (Signed)
Cardiology Office Note:    Date:  05/22/2019   ID:  Kylie Wilson, DOB 1950-07-08, MRN [id number]  PCP:  Virl Cagey, MD  Cardiologist:  Chilton Si, MD  Electrophysiologist:  None   Referring MD: No ref. provider found   CC: palpitations, fatigue  History of Present Illness:    Kylie Wilson is a pleasant 69 y.o. female with a hx of morbid obesity, sleep apnea, not on CPAP, dyslipidemia, hypertension, non-insulin-dependent diabetes, and prior coronary calcification seen on CT scan.  She was seen initially by cardiology in 2018 for chest pain.  Troponin was negative.  CT scan revealed atherosclerosis of the coronaries.  Myoview 7/18 showed no ischemia and normal LV function   The patient was seen in the emergency room 04/25/2019.  She initially presented to urgent care with multiple complaints.  She had a headache, left facial numbness, nausea, bloating, belching, and fatigue.  She also admitted to some chest discomfort but it is hard for her to describe.  She read on the Internet that frequently females have atypical symptoms of coronary disease.  She knew she had "some blockages" by her prior evaluation.  She was sent from the urgent care to the emergency room.  Her EKG showed no acute changes.  Her troponin was negative x2 although the patient read the result as "high-sensitivity troponin" and thought it was positive.  She was seen 04/27/2019 for further evaluation. Myoview was done 05/10/2019 and was normal with an EF of 70%.  She returns today for follow up. She was surprised her Myoview was negative and I explained how that study worked.  She now denies chest pain but complains primarily of fatigue.  Recent labs done by her PCP did not show her to be anemic and her renal and liver function was normal.  She did admit to me that she has had palpitations, mainly at night.  She describes a rapid heart heart as opposed to skipped beats.     Past Medical History:   Diagnosis Date  . Arthritis   . Barrett's esophagus   . Burning pain   . Cervical radiculopathy   . Cervical spondylosis   . DDD (degenerative disc disease), cervical   . Diabetes mellitus    type 2   . GERD (gastroesophageal reflux disease)   . Hyperlipidemia   . Hypertension   . Obesity   . Rectocele   . Sleep apnea    does not tolerate cpap  . Uterine prolapse     Past Surgical History:  Procedure Laterality Date  . BREAST BIOPSY  1980   left  . BREAST EXCISIONAL BIOPSY Left    Bening 1980's  . SCAR REVISION  2001   both legs  . TOE SURGERY  1981   4th and 5th toe right foot  . TOE SURGERY  2002   great toe left  . TONSILLECTOMY    . TUBAL LIGATION      Current Medications: Current Meds  Medication Sig  . ACCU-CHEK AVIVA PLUS test strip USE TO TEST BLOOD SUGARS DAILY.  Marland Kitchen amlodipine-olmesartan (AZOR) 10-20 MG tablet Take 1 tablet by mouth daily.  . Ascorbic Acid (VITAMIN C) 1000 MG tablet Take 1,000 mg by mouth daily.  Marland Kitchen BIOTIN PO Take 1 tablet by mouth daily.  . Blood Glucose Monitoring Suppl (GLUCOCOM BLOOD GLUCOSE MONITOR) DEVI One Touch Verio Meter. FSBS BID. E11.319  . Cholecalciferol (VITAMIN D3 PO) Take 5,000 Units by mouth daily.  Marland Kitchen  Diclofenac-miSOPROStol 50-0.2 MG TBEC Take 1 tablet by mouth 2 (two) times daily.  . dorzolamide-timolol (COSOPT) 22.3-6.8 MG/ML ophthalmic solution Place 1 drop into both eyes 2 (two) times daily.  . hydrochlorothiazide (MICROZIDE) 12.5 MG capsule Take 1 capsule (12.5 mg total) by mouth daily.  Marland Kitchen LINZESS 72 MCG capsule Take 72 mcg by mouth daily as needed (abdominal cramping).   Marland Kitchen MAGNESIUM PO Take 250 mg by mouth daily.  . metFORMIN (GLUCOPHAGE) 500 MG tablet Take 1,000 mg by mouth 2 (two) times daily with a meal.   . Multiple Vitamin (MULTIVITAMIN) tablet Take 1 tablet by mouth daily.  . Netarsudil-Latanoprost (ROCKLATAN) 0.02-0.005 % SOLN Place 1 drop into both eyes at bedtime.   . pantoprazole (PROTONIX) 40 MG tablet  Take 1 tablet (40 mg total) by mouth daily.  . rosuvastatin (CRESTOR) 10 MG tablet Take 1 tablet by mouth daily.  . TRULICITY 1.5 JK/0.9FG SOPN Inject 1.5 mg into the skin once a week.     Allergies:   Lisinopril   Social History   Socioeconomic History  . Marital status: Widowed    Spouse name: Not on file  . Number of children: 3  . Years of education: 8th grade  . Highest education level: Not on file  Occupational History  . Occupation: Retired    Fish farm manager: UNEMPLOYED  Tobacco Use  . Smoking status: Former Smoker    Packs/day: 0.80    Years: 30.00    Pack years: 24.00    Types: Cigarettes    Quit date: 09/15/2011    Years since quitting: 7.6  . Smokeless tobacco: Never Used  . Tobacco comment: smoked 20 years; then quit 10; smoked 30 years plus  Substance and Sexual Activity  . Alcohol use: Yes    Comment: Every evening, 3 glasses of wine   . Drug use: No  . Sexual activity: Not on file  Other Topics Concern  . Not on file  Social History Narrative   Lives at home alone.   Right-handed.   One cup caffeine per day.   Social Determinants of Health   Financial Resource Strain:   . Difficulty of Paying Living Expenses: Not on file  Food Insecurity:   . Worried About Charity fundraiser in the Last Year: Not on file  . Ran Out of Food in the Last Year: Not on file  Transportation Needs:   . Lack of Transportation (Medical): Not on file  . Lack of Transportation (Non-Medical): Not on file  Physical Activity:   . Days of Exercise per Week: Not on file  . Minutes of Exercise per Session: Not on file  Stress:   . Feeling of Stress : Not on file  Social Connections:   . Frequency of Communication with Friends and Family: Not on file  . Frequency of Social Gatherings with Friends and Family: Not on file  . Attends Religious Services: Not on file  . Active Member of Clubs or Organizations: Not on file  . Attends Archivist Meetings: Not on file  . Marital  Status: Not on file     Family History: The patient's family history includes Breast cancer in her paternal aunt; Heart disease in her brother and father; Pancreatic cancer in her father; Stroke in her mother. There is no history of Colon cancer, Stomach cancer, Colon polyps, or Rectal cancer.  ROS:   Please see the history of present illness.     All other systems reviewed and are  negative.  EKGs/Labs/Other Studies Reviewed:    The following studies were reviewed today: Myoview 05/10/2019  EKG:  EKG is not ordered today.  The ekg ordered 04/25/2019 demonstrates NSR- HR 61.   Recent Labs: 04/25/2019: ALT 22; B Natriuretic Peptide 62.9; BUN 13; Creatinine, Ser 0.58; Hemoglobin 12.1; Platelets 191; Potassium 3.9; Sodium 139  Recent Lipid Panel    Component Value Date/Time   CHOL 197 06/04/2016 1114   TRIG 73.0 06/04/2016 1114   HDL 56.00 06/04/2016 1114   CHOLHDL 4 06/04/2016 1114   VLDL 14.6 06/04/2016 1114   LDLCALC 126 (H) 06/04/2016 1114    Physical Exam:    VS:  BP 136/90   Pulse 65   Ht 5\' 4"  (1.626 m)   Wt 262 lb (118.8 kg)   SpO2 95%   BMI 44.97 kg/m     Wt Readings from Last 3 Encounters:  05/22/19 262 lb (118.8 kg)  05/09/19 261 lb (118.4 kg)  04/27/19 261 lb (118.4 kg)     GEN: Morbidly obese AA female, in no acute distress HEENT: Normal NECK: No JVD; No carotid bruits CARDIAC: RRR, no murmurs, rubs, gallops RESPIRATORY:  Clear to auscultation without rales, wheezing or rhonchi  ABDOMEN: Soft, non-tender, non-distended MUSCULOSKELETAL:  No edema; No deformity  SKIN: Warm and dry NEUROLOGIC:  Alert and oriented x 3 PSYCHIATRIC:  Normal affect   ASSESSMENT:    Palpitations- R/O arrhythmia  Fatigue- Document TSH  CAD-  coronary Ca++-on CT- Lexiscan normal.   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  She stopped her vitamin supplement as well and that seemed to help.   Dyslipidemia, goal LDL below 70 She had problems with myalgias in the past- currently on Crestor 10 mg  PLAN:    Document TSH, check 14 day ZIO.  F/U with Dr Duke Salviaandolph 6 weeks   Medication Adjustments/Labs and Tests Ordered: Current medicines are reviewed at length with the patient today.  Concerns regarding medicines are outlined above.  Orders Placed This Encounter  Procedures  . TSH  . LONG TERM MONITOR (3-14 DAYS)   No orders of the defined types were placed in this encounter.   Patient Instructions  Medication Instructions:  Your physician recommends that you continue on your current medications as directed. Please refer to the Current Medication list given to you today.  *If you need a refill on your cardiac medications before your next appointment, please call your pharmacy*  Lab Work:  Your physician recommends that you return for lab work in: TODAY  TSH If you have labs (blood work) drawn today and your tests are completely normal, you will receive your results only by: Marland Kitchen. MyChart Message (if you have MyChart) OR . A paper copy in the mail If you have any lab test that is abnormal or we need to change your treatment, we will call you to review the results.  Testing/Procedures: Christena DeemZIO XT- Long Term Monitor Instructions   Your physician has requested you wear your ZIO patch monitor___14____days.   This is  a single patch monitor.  [employer] supplies one patch monitor per enrollment.  Additional stickers are not available.    Please do not apply patch if you will be having a Nuclear Stress Test, Echocardiogram, Cardiac CT, MRI, or Chest Xray during the time frame you would be wearing the monitor. The patch cannot be worn during these tests.  You cannot remove and re-apply the ZIO XT patch monitor.   Your ZIO patch monitor will be sent USPS Priority mail from [facility] directly to your home address. The monitor may also be mailed to a PO BOX if home delivery is not available.   It may take 3-5 days to receive your monitor after you have been enrolled.   Once you have received you monitor, please review enclosed instructions.  Your monitor has already been registered assigning a specific monitor serial # to you.   Applying the monitor   Shave hair from upper left chest.   Hold abrader disc by orange tab.  Rub abrader in 40 strokes over left upper chest as indicated in your monitor instructions.   Clean area with 4 enclosed alcohol pads .  Use all pads to assure are is cleaned thoroughly.  Let dry.   Apply patch as indicated in monitor instructions.  Patch will be place under collarbone on left side of chest with arrow pointing upward.   Rub patch adhesive wings for 2 minutes.Remove white label marked "1".  Remove white label marked "2".  Rub patch adhesive wings for 2 additional minutes.   While looking in a mirror, press and release button in center of patch.  A small green light will flash 3-4 times .  This will be your only indicator the monitor has been turned on.     Do not shower for the first 24 hours.  You may shower after the first 24 hours.   Press button if you feel a symptom. You will hear a small click.  Record Date, Time and Symptom in the Patient Log Book.   When you are ready to remove patch, follow instructions on last 2 pages of Patient Log Book.  Stick patch monitor onto last page of Patient Log Book.   Place Patient Log Book in [place] box.  Use locking tab on box and tape box closed  securely.  The [employer] and [employer] has [employer] on it.  Please place in mailbox as soon as possible.  Your physician should have your test results approximately 7 days after the monitor has been mailed back to [facility].   Call [facility] Customer Care at [phone] if you have questions regarding your ZIO XT patch monitor.  Call them immediately if you see an orange light blinking on your monitor.   If your monitor falls off in less than 4 days contact our Monitor department at [phone].  If your monitor becomes loose or falls off after 4 days call [employer] at [phone] for suggestions on securing your monitor.    Follow-Up: At [facility] you and your health needs are our priority.  As part of our continuing mission to provide you with exceptional heart care, we have created designated Provider Care Teams.  These Care Teams include your primary Cardiologist (physician) and Advanced Practice Providers (APPs -  Physician Assistants and Nurse Practitioners) who all work together to provide you with the care you need, when you need it.  We recommend signing up for the patient portal called "MyChart".  Sign up information  is provided on this After Visit Summary.  MyChart is used to connect with patients for Virtual Visits (Telemedicine).  Patients are able to view lab/test results, encounter notes, upcoming appointments, etc.  Non-urgent messages can be sent to your provider as well.   To learn more about what you can do with MyChart, go to [email or web address].    Your next appointment:   6 week(s)  The format for your next appointment:   In Person  Provider:   Chilton Si, MD  Other Instructions      Signed, Corine Shelter, PA-C  05/22/2019 2:51 PM    [facility]

## 2019-05-22 NOTE — Assessment & Plan Note (Signed)
R/O arrhythmia  

## 2019-05-23 LAB — TSH: TSH: 1.29 u[IU]/mL (ref 0.450–4.500)

## 2019-05-25 ENCOUNTER — Telehealth: Payer: Self-pay | Admitting: Cardiology

## 2019-05-25 NOTE — Telephone Encounter (Signed)
Patient returned call for lab results.  

## 2019-05-25 NOTE — Telephone Encounter (Signed)
Pt aware of normal TSH level. No additional questions at this time.

## 2019-07-02 ENCOUNTER — Ambulatory Visit: Payer: Medicare Other | Admitting: Cardiovascular Disease

## 2019-07-13 ENCOUNTER — Encounter: Payer: Self-pay | Admitting: Cardiology

## 2019-07-13 ENCOUNTER — Other Ambulatory Visit: Payer: Self-pay

## 2019-07-13 ENCOUNTER — Ambulatory Visit: Payer: Medicare Other | Admitting: Cardiology

## 2019-07-13 VITALS — BP 140/79 | HR 68 | Temp 98.2°F | Resp 17 | Ht 64.0 in | Wt 268.0 lb

## 2019-07-13 DIAGNOSIS — K222 Esophageal obstruction: Secondary | ICD-10-CM | POA: Insufficient documentation

## 2019-07-13 DIAGNOSIS — Z6841 Body Mass Index (BMI) 40.0 and over, adult: Secondary | ICD-10-CM

## 2019-07-13 DIAGNOSIS — G4733 Obstructive sleep apnea (adult) (pediatric): Secondary | ICD-10-CM

## 2019-07-13 DIAGNOSIS — E1165 Type 2 diabetes mellitus with hyperglycemia: Secondary | ICD-10-CM

## 2019-07-13 DIAGNOSIS — K219 Gastro-esophageal reflux disease without esophagitis: Secondary | ICD-10-CM

## 2019-07-13 DIAGNOSIS — I10 Essential (primary) hypertension: Secondary | ICD-10-CM

## 2019-07-13 DIAGNOSIS — I2510 Atherosclerotic heart disease of native coronary artery without angina pectoris: Secondary | ICD-10-CM

## 2019-07-13 DIAGNOSIS — Z87891 Personal history of nicotine dependence: Secondary | ICD-10-CM

## 2019-07-13 DIAGNOSIS — E785 Hyperlipidemia, unspecified: Secondary | ICD-10-CM

## 2019-07-13 MED ORDER — ROSUVASTATIN CALCIUM 10 MG PO TABS: 10.0000 mg | ORAL_TABLET | Freq: Every day | ORAL | 0 refills | Status: AC

## 2019-07-13 NOTE — Progress Notes (Signed)
Date:  07/13/2019   ID:  [name], DOB [birth year], MRN [id number]  PCP:  No primary care provider on file.  Cardiologist:  Tessa Lerner, DO, [facility] (established care 07/13/2019) Former Cardiology Providers: Dr. Chilton Si and and Corine Shelter PA-C  REASON FOR CONSULT: Second opinion  REQUESTING PHYSICIAN:  Virl Cagey, MD [address] [place],  [place] [address]  Chief Complaint  Patient presents with  . second opinion    heart blockage  . New Patient (Initial Visit)    HPI  Kylie Wilson is a 69 y.o. female who presents to the office as a self-referral for second opinion regarding CT findings and nuclear stress test results.  Patient's past medical history and cardiac risk factors include: Obesity due to excess calories, OSA not on CPAP, dyslipidemia, hypertension, non-insulin-dependent diabetes mellitus type 2, coronary artery calcification, postmenopausal female, advanced age, and former smoker.  Patient had a CT chest with contrast back in December 2017 and per report she was noted to have coronary artery calcification and has been following up with [facility].  Recently in February 2021 she had an episode of chest pain that was the worst that she has felt and went to the ER for further evaluation.  She was noted to have 2 negative high sensitive troponin and BNP was 63.  She was discharged home with close follow-up with cardiology.  She last saw Corine Shelter PA-C who ordered nuclear stress test given the coronary artery calcification.  The nuclear stress test was interpreted as normal.  Since then patient states that she has intermittent episodes of chest discomfort mostly secondary to the stress regarding one of her daughter's wellbeing.  Patient states that periodically she may have chest discomfort while she is sitting and reminiscing of the course of events in her daughter's life.  The discomfort is located substernally, describes it like an ache-like sensation,  last for a few seconds, intensity 3 out of 10, nonradiating, nonexertional, self-limited.  No improving or worsening factors.  Patient tries to go to a senior citizens facility to exercise on a weekly basis also tries to walk as much as possible by himself but with friends.  She has not noticed any decrease in overall endurance with physical exertion.  She presents today to have her tests reviewed and "to see if any additional testing is needed to remove the blockage."  FUNCTIONAL STATUS: Goes to the senior fitness center twice a week, does home arthritis exercise, and walks with friend once a week.    ALLERGIES: Allergies  Allergen Reactions  . Lisinopril Other (See Comments)    cough    MEDICATION LIST PRIOR TO VISIT: Current Meds  Medication Sig  . ACCU-CHEK AVIVA PLUS test strip USE TO TEST BLOOD SUGARS DAILY.  Marland Kitchen amlodipine-olmesartan (AZOR) 10-20 MG tablet Take 1 tablet by mouth daily.  . Ascorbic Acid (VITAMIN C) 1000 MG tablet Take 1,000 mg by mouth daily.  Marland Kitchen BIOTIN PO Take 1 tablet by mouth daily.  . Blood Glucose Monitoring Suppl (GLUCOCOM BLOOD GLUCOSE MONITOR) DEVI One Touch Verio Meter. FSBS BID. E11.319  . Cholecalciferol (VITAMIN D3 PO) Take 5,000 Units by mouth daily.  . dorzolamide-timolol (COSOPT) 22.3-6.8 MG/ML ophthalmic solution Place 1 drop into both eyes 2 (two) times daily.  . hydrochlorothiazide (MICROZIDE) 12.5 MG capsule Take 1 capsule (12.5 mg total) by mouth daily.  Marland Kitchen LINZESS 72 MCG capsule Take 72 mcg by mouth daily as needed (abdominal cramping).   Marland Kitchen MAGNESIUM PO  Take 250 mg by mouth daily.  . metFORMIN (GLUCOPHAGE) 500 MG tablet Take 1,000 mg by mouth 2 (two) times daily with a meal.   . Multiple Vitamin (MULTIVITAMIN) tablet Take 1 tablet by mouth daily.  . Netarsudil-Latanoprost (ROCKLATAN) 0.02-0.005 % SOLN Place 1 drop into both eyes at bedtime.   . TRULICITY 1.5 MG/0.5ML SOPN Inject 1.5 mg into the skin once a week.     PAST MEDICAL HISTORY: Past  Medical History:  Diagnosis Date  . Arthritis   . Barrett's esophagus   . Burning pain   . Cervical radiculopathy   . Cervical spondylosis   . Coronary artery calcification   . DDD (degenerative disc disease), cervical   . Diabetes mellitus    type 2   . GERD (gastroesophageal reflux disease)   . Hyperlipidemia   . Hypertension   . Obesity   . Rectocele   . Sleep apnea    does not tolerate cpap  . Uterine prolapse     PAST SURGICAL HISTORY: Past Surgical History:  Procedure Laterality Date  . BREAST BIOPSY  1980   left  . BREAST EXCISIONAL BIOPSY Left    Bening 1980's  . SCAR REVISION  2001   both legs  . TOE SURGERY  1981   4th and 5th toe right foot  . TOE SURGERY  2002   great toe left  . TONSILLECTOMY    . TUBAL LIGATION      FAMILY HISTORY: The patient family history includes Breast cancer in her paternal aunt; Heart disease in her brother and father; Pancreatic cancer in her father; Stroke in her mother.  SOCIAL HISTORY:  The patient  reports that she quit smoking about 7 years ago. Her smoking use included cigarettes. She has a 24.00 pack-year smoking history. She has never used smokeless tobacco. She reports current alcohol use. She reports that she does not use drugs.  REVIEW OF SYSTEMS: Review of Systems  Constitution: Positive for malaise/fatigue. Negative for chills and fever.  HENT: Negative for hoarse voice and nosebleeds.   Eyes: Negative for discharge, double vision and pain.  Cardiovascular: Negative for chest pain, claudication, dyspnea on exertion, leg swelling, near-syncope, orthopnea, palpitations, paroxysmal nocturnal dyspnea and syncope.  Respiratory: Positive for shortness of breath (chronic). Negative for hemoptysis.   Musculoskeletal: Negative for muscle cramps and myalgias.  Gastrointestinal: Negative for abdominal pain, constipation, diarrhea, hematemesis, hematochezia, melena, nausea and vomiting.  Neurological: Negative for dizziness  and light-headedness.    PHYSICAL EXAM: Vitals with BMI 07/13/2019 05/22/2019 05/09/2019  Height 5\' 4"  5\' 4"  5\' 3"   Weight 268 lbs 262 lbs 261 lbs  BMI 45.98 44.95 46.25  Systolic 140 136 -  Diastolic 79 90 -  Pulse 68 65 -   CONSTITUTIONAL: Well-developed and well-nourished. No acute distress.  SKIN: Skin is warm and dry. No rash noted. No cyanosis. No pallor. No jaundice HEAD: Normocephalic and atraumatic.  EYES: No scleral icterus MOUTH/THROAT: Moist oral membranes.  NECK: No JVD present. No thyromegaly noted. No carotid bruits  LYMPHATIC: No visible cervical adenopathy.  CHEST Normal respiratory effort. No intercostal retractions  LUNGS: Clear to auscultation bilaterally no stridor. No wheezes. No rales.  CARDIOVASCULAR: Regular rate and rhythm, positive S1-S2, no murmurs rubs or gallops appreciated. ABDOMINAL: Obese, soft, nontender, nondistended, positive bowel sounds all 4 quadrants. No apparent ascites.  EXTREMITIES: No peripheral edema  HEMATOLOGIC: No significant bruising NEUROLOGIC: Oriented to person, place, and time. Nonfocal. Normal muscle tone.  PSYCHIATRIC: Normal mood  and affect. Normal behavior. Cooperative  CARDIAC DATABASE: EKG: 07/13/2019: Normal sinus rhythm, 64 bpm, normal axis, nonspecific T wave abnormality, no underlying injury pattern  Echocardiogram: None  Stress Testing: MPI 04/2019:Nuclear stress EF: 70%. The left ventricular ejection fraction is hyperdynamic (>65%). There was no ST segment deviation noted during stress. This is a low risk study. The study is normal.  Heart Catheterization: None  LABORATORY DATA: CBC Latest Ref Rng & Units 04/25/2019 08/22/2016 02/19/2016  WBC 4.0 - 10.5 K/uL 5.5 6.8 6.3  Hemoglobin 12.0 - 15.0 g/dL 55.2 11.9(L) 12.4  Hematocrit 36.0 - 46.0 % 38.6 37.4 38.5  Platelets 150 - 400 K/uL 191 198 191    CMP Latest Ref Rng & Units 04/25/2019 08/22/2016 06/04/2016  Glucose 70 - 99 mg/dL 080(E) 233(K) 122(E)  BUN 8 - 23  mg/dL 13 10 12   Creatinine 0.44 - 1.00 mg/dL 4.97 5.30  Sodium 135 - 145 mmol/L 139 137 138  Potassium 3.5 - 5.1 mmol/L 3.9 3.8 4.2  Chloride 98 - 111 mmol/L 105 104 102  CO2 22 - 32 mmol/L 26 24 28   Calcium 8.9 - 10.3 mg/dL 9.0 0.51) 9.0  Total Protein 6.5 - 8.1 g/dL 7.6 - -  Total Bilirubin 0.3 - 1.2 mg/dL 0.6 - -  Alkaline Phos 38 - 126 U/L 102 - -  AST 15 - 41 U/L 18 - -  ALT 0 - 44 U/L 22 - -    Lipid Panel     Component Value Date/Time   CHOL 197 06/04/2016 1114   TRIG 73.0 06/04/2016 1114   HDL 56.00 06/04/2016 1114   CHOLHDL 4 06/04/2016 1114   VLDL 14.6 06/04/2016 1114   LDLCALC 126 (H) 06/04/2016 1114    Lab Results  Component Value Date   HGBA1C 9.2 (H) 06/04/2016   HGBA1C 7.9 (H) 02/17/2016   HGBA1C 7.3 07/24/2015   No components found for: NTPROBNP Lab Results  Component Value Date   TSH 1.290 05/22/2019   TSH 1.35 07/24/2015   Cardiac Panel (last 3 results) No results for input(s): CKTOTAL, CKMB, TROPONINIHS, RELINDX in the last 72 hours.  TSH Recent Labs    05/22/19 1515  TSH 1.290   IMPRESSION:    ICD-10-CM   1. Coronary artery calcification of native artery  I25.10 PCV ECHOCARDIOGRAM COMPLETE   I25.84   2. Essential hypertension  I10 EKG 12-Lead  3. Dyslipidemia, goal LDL below 70  E78.5   4. Obstructive sleep apnea syndrome  G47.33   5. Gastroesophageal reflux disease, unspecified whether esophagitis present  K21.9   6. Type 2 diabetes mellitus with hyperglycemia, without long-term current use of insulin (HCC)  E11.65   7. Class 3 severe obesity due to excess calories with serious comorbidity and body mass index (BMI) of 45.0 to 49.9 in adult (HCC)  E66.01    Z68.42   8. Former smoker  Z87.891      RECOMMENDATIONS: Kylie Wilson is a 69 y.o. female whose past medical history and cardiac risk factors include: Obesity due to excess calories, OSA not on CPAP, dyslipidemia, hypertension, non-insulin-dependent diabetes mellitus  type 2, coronary artery calcification, postmenopausal female, advanced age, and former smoker.  Coronary artery calcification without active angina pectoris:  I independently reviewed her CT of the chest with contrast that was performed back in December 2017.  I showed her the calcifications within her coronary tree.  I also informed her that the study was not designed to look for coronary artery  calcification and therefore cannot quantify the degree of coronary artery calcification.  Even the fact that she is diabetic and has coronary artery calcification I agree with continuing statin therapy at the given time.  Would also recommend aspirin 81 mg p.o. daily.  Patient's most recent nuclear stress test results were independently reviewed.  There are still perfusion images that are available on EMR were reviewed with the patient in great detail.  Her questions and concerns were addressed to her satisfaction she was thankful for going over her test results.  Educated on importance of continued lifestyle modifications to improve her modifiable cardiovascular risk factors.  Encouraged walking 30 minutes a day 5 days a week as tolerated.  Patient understands the importance of better glycemic control.  Patient also understands the importance of decreasing stress.  Echocardiogram will be ordered to evaluate for structural heart disease and left ventricular systolic function.  Benign essential hypertension: Currently managed by primary team.  Patient is asked to keep a log of her blood pressures and to take it in at the next office visit to see if medical therapy has to be uptitrated.  Low-salt diet recommended.  Dyslipidemia: Continue rosuvastatin.  Management primary team.  Patient does not endorse any myalgias.  Obstructive sleep apnea not on CPAP: Patient states that she cannot tolerate the CPAP machine.  I have asked her to work with her primary team to possibly be reevaluated for sleep apnea  and to see if her appliance can be updated in the hopes of improving compliance.  FINAL MEDICATION LIST END OF ENCOUNTER: Meds ordered this encounter  Medications  . rosuvastatin (CRESTOR) 10 MG tablet    Sig: Take 1 tablet (10 mg total) by mouth at bedtime for 90 doses.    Dispense:  90 tablet    Refill:  0    Medications Discontinued During This Encounter  Medication Reason  . Diclofenac-miSOPROStol 50-0.2 MG TBEC Discontinued by provider  . rosuvastatin (CRESTOR) 10 MG tablet Patient Preference  . pantoprazole (PROTONIX) 40 MG tablet Patient Preference     Current Outpatient Medications:  .  ACCU-CHEK AVIVA PLUS test strip, USE TO TEST BLOOD SUGARS DAILY., Disp: 100 each, Rfl: 1 .  amlodipine-olmesartan (AZOR) 10-20 MG tablet, Take 1 tablet by mouth daily., Disp: , Rfl:  .  Ascorbic Acid (VITAMIN C) 1000 MG tablet, Take 1,000 mg by mouth daily., Disp: , Rfl:  .  BIOTIN PO, Take 1 tablet by mouth daily., Disp: , Rfl:  .  Blood Glucose Monitoring Suppl (GLUCOCOM BLOOD GLUCOSE MONITOR) DEVI, One Touch Verio Meter. FSBS BID. E11.319, Disp: , Rfl:  .  Cholecalciferol (VITAMIN D3 PO), Take 5,000 Units by mouth daily., Disp: , Rfl:  .  dorzolamide-timolol (COSOPT) 22.3-6.8 MG/ML ophthalmic solution, Place 1 drop into both eyes 2 (two) times daily., Disp: , Rfl:  .  hydrochlorothiazide (MICROZIDE) 12.5 MG capsule, Take 1 capsule (12.5 mg total) by mouth daily., Disp: 90 capsule, Rfl: 1 .  LINZESS 72 MCG capsule, Take 72 mcg by mouth daily as needed (abdominal cramping). , Disp: , Rfl:  .  MAGNESIUM PO, Take 250 mg by mouth daily., Disp: , Rfl:  .  metFORMIN (GLUCOPHAGE) 500 MG tablet, Take 1,000 mg by mouth 2 (two) times daily with a meal. , Disp: , Rfl:  .  Multiple Vitamin (MULTIVITAMIN) tablet, Take 1 tablet by mouth daily., Disp: , Rfl:  .  Netarsudil-Latanoprost (ROCKLATAN) 0.02-0.005 % SOLN, Place 1 drop into both eyes at bedtime. , Disp: ,  Rfl:  .  TRULICITY 1.5 MG/0.5ML SOPN, Inject  1.5 mg into the skin once a week., Disp: , Rfl:  .  rosuvastatin (CRESTOR) 10 MG tablet, Take 1 tablet (10 mg total) by mouth at bedtime for 90 doses., Disp: 90 tablet, Rfl: 0  Orders Placed This Encounter  Procedures  . EKG 12-Lead  . PCV ECHOCARDIOGRAM COMPLETE   --Continue cardiac medications as reconciled in final medication list. --Return in about 6 months (around 01/12/2020). Or sooner if needed. --Continue follow-up with your primary care physician regarding the management of your other chronic comorbid conditions.  Patient's questions and concerns were addressed to her satisfaction. She voices understanding of the instructions provided during this encounter.   This note was created using a voice recognition software as a result there may be grammatical errors inadvertently enclosed that do not reflect the nature of this encounter. Every attempt is made to correct such errors.  Tessa Lerner, [place], [facility]  Pager: [phone] Office: [phone]

## 2019-07-23 ENCOUNTER — Other Ambulatory Visit: Payer: Self-pay

## 2019-07-23 ENCOUNTER — Ambulatory Visit: Payer: Medicare Other

## 2019-07-23 DIAGNOSIS — I2510 Atherosclerotic heart disease of native coronary artery without angina pectoris: Secondary | ICD-10-CM

## 2019-07-31 NOTE — Progress Notes (Signed)
Relayed information to patient. Patient voiced understanding.  

## 2019-08-21 ENCOUNTER — Other Ambulatory Visit: Payer: Medicare Other

## 2019-09-18 DIAGNOSIS — H5213 Myopia, bilateral: Secondary | ICD-10-CM | POA: Insufficient documentation

## 2019-09-18 DIAGNOSIS — Z961 Presence of intraocular lens: Secondary | ICD-10-CM | POA: Insufficient documentation

## 2019-09-18 DIAGNOSIS — H3581 Retinal edema: Secondary | ICD-10-CM | POA: Insufficient documentation

## 2019-10-17 ENCOUNTER — Other Ambulatory Visit: Payer: Self-pay | Admitting: Cardiology

## 2019-11-06 DIAGNOSIS — J432 Centrilobular emphysema: Secondary | ICD-10-CM | POA: Insufficient documentation

## 2019-11-13 DIAGNOSIS — M5412 Radiculopathy, cervical region: Secondary | ICD-10-CM | POA: Insufficient documentation

## 2020-01-14 ENCOUNTER — Ambulatory Visit: Payer: Medicare Other | Admitting: Cardiology

## 2020-01-14 ENCOUNTER — Other Ambulatory Visit: Payer: Self-pay

## 2020-01-14 ENCOUNTER — Encounter: Payer: Self-pay | Admitting: Cardiology

## 2020-01-14 VITALS — BP 169/83 | HR 67 | Ht 64.0 in | Wt 263.0 lb

## 2020-01-14 DIAGNOSIS — I2510 Atherosclerotic heart disease of native coronary artery without angina pectoris: Secondary | ICD-10-CM

## 2020-01-14 DIAGNOSIS — I10 Essential (primary) hypertension: Secondary | ICD-10-CM

## 2020-01-14 DIAGNOSIS — Z87891 Personal history of nicotine dependence: Secondary | ICD-10-CM

## 2020-01-14 DIAGNOSIS — E1165 Type 2 diabetes mellitus with hyperglycemia: Secondary | ICD-10-CM

## 2020-01-14 DIAGNOSIS — Z6841 Body Mass Index (BMI) 40.0 and over, adult: Secondary | ICD-10-CM

## 2020-01-14 DIAGNOSIS — G4733 Obstructive sleep apnea (adult) (pediatric): Secondary | ICD-10-CM

## 2020-01-14 DIAGNOSIS — E785 Hyperlipidemia, unspecified: Secondary | ICD-10-CM

## 2020-01-14 MED ORDER — HYDROCHLOROTHIAZIDE 25 MG PO TABS: 25.0000 mg | ORAL_TABLET | Freq: Every morning | ORAL | 0 refills | Status: DC

## 2020-01-14 NOTE — Progress Notes (Signed)
Date:  01/14/2020   ID:  [name], DOB September 05, 1950, MRN [id number]  PCP:  No primary care provider on file.  Cardiologist:  Tessa Lerner, DO, [facility] (established care 07/13/2019) Former Cardiology Providers: Dr. Chilton Si and and Corine Shelter PA-C  Date: 01/14/20 Last Office Visit: 07/13/2019  Chief Complaint  Patient presents with  . Coronary Artery Disease  . Follow-up    HPI  Carleah Yablonski is a 69 y.o. female whose past medical history and cardiovascular risk factors include: Obesity due to excess calories, OSA not on CPAP, dyslipidemia, hypertension, non-insulin-dependent diabetes mellitus type 2, coronary artery calcification, postmenopausal female, advanced age, and former smoker.  She is here for her 62-month follow-up for the management of CAD.  Patient had a CT chest with contrast back in December 2017 and per report she was noted to have coronary artery calcification and has been following up with [facility].  Recently in February 2021 she had an episode of chest pain that was the worst that she has felt and went to the ER for further evaluation.  She was noted to have 2 negative high sensitive troponin and BNP was 63.  She was discharged home with close follow-up with cardiology.  She last saw Corine Shelter PA-C who ordered nuclear stress test given the coronary artery calcification.  The nuclear stress test was interpreted as normal.   Since last visit patient states that she is doing well from a cardiovascular standpoint.  She denies any chest pain at rest or with effort related activities.  Her shortness of breath is chronic and stable.  No recent hospitalizations or urgent care visits from cardiovascular standpoint.  Since last visit patient did have an echocardiogram results of which were reviewed with her at today's office visit and noted below for further reference.  Medications reconciled.  Patient is concerned that her home blood pressures are elevated.   She states that her systolic blood pressure ranges between 144-171 mmHg and diastolic blood pressures range between 85-90 mmHg.  I do not have a blood pressure log to review at today's encounter.  Patient states that her blood pressures are currently being managed by her primary care provider.  Since COVID-19 pandemic her physical exertion is also reduced as she no longer goes to the senior fitness center which she used to go twice a week.  ALLERGIES: Allergies  Allergen Reactions  . Duloxetine Other (See Comments)    Pt says it also caused high pressure.  . Lisinopril Other (See Comments)    cough    MEDICATION LIST PRIOR TO VISIT: Current Meds  Medication Sig  . ACCU-CHEK AVIVA PLUS test strip USE TO TEST BLOOD SUGARS DAILY.  Marland Kitchen amlodipine-olmesartan (AZOR) 10-20 MG tablet Take 1 tablet by mouth daily.  . Ascorbic Acid (VITAMIN C) 1000 MG tablet Take 1,000 mg by mouth daily.  Marland Kitchen aspirin EC 81 MG tablet Take 81 mg by mouth daily. Swallow whole.  . Blood Glucose Monitoring Suppl (GLUCOCOM BLOOD GLUCOSE MONITOR) DEVI One Touch Verio Meter. FSBS BID. E11.319  . Cholecalciferol (VITAMIN D3 PO) Take 5,000 Units by mouth daily.  . dorzolamide-timolol (COSOPT) 22.3-6.8 MG/ML ophthalmic solution Place 1 drop into both eyes 2 (two) times daily.  Marland Kitchen JARDIANCE 25 MG TABS tablet Take 25 mg by mouth daily.  Marland Kitchen LINZESS 72 MCG capsule Take 72 mcg by mouth daily as needed (abdominal cramping).   Marland Kitchen MAGNESIUM PO Take 250 mg by mouth daily.  . metFORMIN (GLUCOPHAGE) 500 MG  tablet Take 1,000 mg by mouth 2 (two) times daily with a meal.   . pantoprazole (PROTONIX) 40 MG tablet Take 40 mg by mouth daily.  . rosuvastatin (CRESTOR) 10 MG tablet Take 1 tablet (10 mg total) by mouth at bedtime for 90 doses.  . [DISCONTINUED] hydrochlorothiazide (MICROZIDE) 12.5 MG capsule TAKE 1 CAPSULE BY MOUTH EVERY DAY  . [DISCONTINUED] rosuvastatin (CRESTOR) 10 MG tablet Take 10 mg by mouth daily.     PAST MEDICAL  HISTORY: Past Medical History:  Diagnosis Date  . Arthritis   . Barrett's esophagus   . Burning pain   . Cervical radiculopathy   . Cervical spondylosis   . Coronary artery calcification   . DDD (degenerative disc disease), cervical   . Diabetes mellitus    type 2   . GERD (gastroesophageal reflux disease)   . Hyperlipidemia   . Hypertension   . Obesity   . Rectocele   . Sleep apnea    does not tolerate cpap  . Uterine prolapse     PAST SURGICAL HISTORY: Past Surgical History:  Procedure Laterality Date  . BREAST BIOPSY  1980   left  . BREAST EXCISIONAL BIOPSY Left    Bening 1980's  . SCAR REVISION  2001   both legs  . TOE SURGERY  1981   4th and 5th toe right foot  . TOE SURGERY  2002   great toe left  . TONSILLECTOMY    . TUBAL LIGATION      FAMILY HISTORY: The patient family history includes Breast cancer in her paternal aunt; Heart disease in her brother and father; Pancreatic cancer in her father; Stroke in her mother.  SOCIAL HISTORY:  The patient  reports that she quit smoking about 8 years ago. Her smoking use included cigarettes. She has a 24.00 pack-year smoking history. She has never used smokeless tobacco. She reports current alcohol use. She reports that she does not use drugs.  REVIEW OF SYSTEMS: Review of Systems  Constitutional: Negative for chills, fever and malaise/fatigue.  HENT: Negative for hoarse voice and nosebleeds.   Eyes: Negative for discharge, double vision and pain.  Cardiovascular: Negative for chest pain, claudication, dyspnea on exertion, leg swelling, near-syncope, orthopnea, palpitations, paroxysmal nocturnal dyspnea and syncope.  Respiratory: Positive for shortness of breath (chronic). Negative for hemoptysis.   Musculoskeletal: Negative for muscle cramps and myalgias.  Gastrointestinal: Negative for abdominal pain, constipation, diarrhea, hematemesis, hematochezia, melena, nausea and vomiting.  Neurological: Negative for  dizziness and light-headedness.   PHYSICAL EXAM: Vitals with BMI 01/14/2020 07/13/2019 05/22/2019  Height     Weight 263 lbs 268 lbs 262 lbs  BMI 45.12 45.98 44.95  Systolic 169 140 161  Diastolic 83 79 90  Pulse 67 68 65   CONSTITUTIONAL: Well-developed and well-nourished. No acute distress.  SKIN: Skin is warm and dry. No rash noted. No cyanosis. No pallor. No jaundice HEAD: Normocephalic and atraumatic.  EYES: No scleral icterus MOUTH/THROAT: Moist oral membranes.  NECK: No JVD present. No thyromegaly noted. No carotid bruits  LYMPHATIC: No visible cervical adenopathy.  CHEST Normal respiratory effort. No intercostal retractions  LUNGS: Clear to auscultation bilaterally no stridor. No wheezes. No rales.  CARDIOVASCULAR: Regular rate and rhythm, positive S1-S2, no murmurs rubs or gallops appreciated. ABDOMINAL: Obese, soft, nontender, nondistended, positive bowel sounds all 4 quadrants. No apparent ascites.  EXTREMITIES: No peripheral edema  HEMATOLOGIC: No significant bruising NEUROLOGIC: Oriented to person, place, and time. Nonfocal. Normal muscle tone.  PSYCHIATRIC: Normal mood and affect. Normal behavior. Cooperative  CARDIAC DATABASE: EKG: 01/14/2020: Normal sinus rhythm, 61 bpm, normal axis, nonspecific T wave abnormality, without underlying injury pattern.  Echocardiogram: 07/23/2019:  Normal LV systolic function with visual EF 65-70%. Left ventricle cavity is normal in size. Mild concentric hypertrophy of the left ventricle. Normal global wall motion. Normal diastolic filling pattern, normal LAP. Calculated EF 66%.  Mild mitral valve leaflet thickening. Normal mitral valve leaflet mobility. No evidence of mitral stenosis. Mild (Grade I) mitral regurgitation.  No other significant valvular abnormalities. No prior study for comparison.  Stress Testing: MPI 04/2019:Nuclear stress EF: 70%. The left ventricular ejection fraction is hyperdynamic (>65%). There was no  ST segment deviation noted during stress. This is a low risk study. The study is normal.  Heart Catheterization: None  LABORATORY DATA: CBC Latest Ref Rng & Units 04/25/2019 08/22/2016 02/19/2016  WBC 4.0 - 10.5 K/uL 5.5 6.8 6.3  Hemoglobin 12.0 - 15.0 g/dL 16.112.1 11.9(L) 12.4  Hematocrit 36 - 46 % 38.6 37.4 38.5  Platelets 150 - 400 K/uL 191 198 191    CMP Latest Ref Rng & Units 04/25/2019 08/22/2016 06/04/2016  Glucose 70 - 99 mg/dL 096(E124(H) 454(U175(H) 981(X236(H)  BUN 8 - 23 mg/dL 13 10 12   Creatinine 0.44 - 1.00 mg/dL 9.140.58 7.820.77 9.560.69  Sodium 135 - 145 mmol/L 139 137 138  Potassium 3.5 - 5.1 mmol/L 3.9 3.8 4.2  Chloride 98 - 111 mmol/L 105 104 102  CO2 22 - 32 mmol/L 26 24 28   Calcium 8.9 - 10.3 mg/dL 9.0 2.1(H8.8(L) 9.0  Total Protein 6.5 - 8.1 g/dL 7.6 - -  Total Bilirubin 0.3 - 1.2 mg/dL 0.6 - -  Alkaline Phos 38 - 126 U/L 102 - -  AST 15 - 41 U/L 18 - -  ALT 0 - 44 U/L 22 - -    Lipid Panel     Component Value Date/Time   CHOL 197 06/04/2016 1114   TRIG 73.0 06/04/2016 1114   HDL 56.00 06/04/2016 1114   CHOLHDL 4 06/04/2016 1114   VLDL 14.6 06/04/2016 1114   LDLCALC 126 (H) 06/04/2016 1114    Lab Results  Component Value Date   HGBA1C 9.2 (H) 06/04/2016   HGBA1C 7.9 (H) 02/17/2016   HGBA1C 7.3 07/24/2015   No components found for: NTPROBNP Lab Results  Component Value Date   TSH 1.290 05/22/2019   TSH 1.35 07/24/2015   Cardiac Panel (last 3 results) No results for input(s): CKTOTAL, CKMB, TROPONINIHS, RELINDX in the last 72 hours.  TSH Recent Labs    05/22/19 1515  TSH 1.290   IMPRESSION:    ICD-10-CM   1. Coronary artery calcification of native artery  I25.10 EKG 12-Lead   I25.84   2. Dyslipidemia, goal LDL below 70  E78.5   3. Essential hypertension  I10 hydrochlorothiazide (HYDRODIURIL) 25 MG tablet    Basic metabolic panel    Magnesium  4. Obstructive sleep apnea syndrome  G47.33   5. Type 2 diabetes mellitus with hyperglycemia, without long-term current use  of insulin (HCC)  E11.65   6. Former smoker  Z87.891   7. Class 3 severe obesity due to excess calories with serious comorbidity and body mass index (BMI) of 45.0 to 49.9 in adult [facility](HCC)  E66.01    Z68.42      RECOMMENDATIONS: Gabriel EaringJanice Robinson Duer is a 69 y.o. female whose past medical history and cardiac risk factors include: Obesity due to excess calories, OSA not  on CPAP, dyslipidemia, hypertension, non-insulin-dependent diabetes mellitus type 2, coronary artery calcification, postmenopausal female, advanced age, and former smoker.  Coronary artery calcification without active angina pectoris:  Continue aspirin and statin therapy.  Patient has undergone ischemic evaluation including echo and stress test.  Stress test was performed in outside facility results noted above.  Echocardiogram which was performed since last office visit was reviewed with the patient with her in great detail.  EF is preserved and no significant valvular heart disease.  Continue to monitor symptoms.    Educated on the importance of improving her modifiable cardiovascular risk factors.    Encouraged walking 30 minutes a day 5 days a week as tolerated.  Patient understands the importance of better glycemic control.  Patient also understands the importance of decreasing stress.  Benign essential hypertension:  Office blood pressure not well controlled. Currently managed by primary care provider.  Patient states that she does not have an upcoming appointment to see her PCP for medication titration. Since her blood pressures are reaching close to 170 mmHg I recommended increasing her hydrochlorothiazide to 25 mg p.o. daily. Blood work in 1 week to reevaluate kidney function and electrolytes. Patient is asked to keep a log of her blood pressures and to make a follow-up appointment for her PCP to see if other additional medication titration is warranted. Low-salt diet recommended.  Dyslipidemia: Continue  rosuvastatin.  Management primary team.  Patient does not endorse any myalgias.  Patient is asked to bring her most recent lipid profile in at the next office visit for review.  Obstructive sleep apnea not on CPAP: Encouraged her to improve her CPAP compliance.  And if needed may consider discussing it with her sleep medicine physician/provider for different mask in the hopes of improving compliance.  FINAL MEDICATION LIST END OF ENCOUNTER: Meds ordered this encounter  Medications  . hydrochlorothiazide (HYDRODIURIL) 25 MG tablet    Sig: Take 1 tablet (25 mg total) by mouth in the morning.    Dispense:  90 tablet    Refill:  0     Current Outpatient Medications:  .  ACCU-CHEK AVIVA PLUS test strip, USE TO TEST BLOOD SUGARS DAILY., Disp: 100 each, Rfl: 1 .  amlodipine-olmesartan (AZOR) 10-20 MG tablet, Take 1 tablet by mouth daily., Disp: , Rfl:  .  Ascorbic Acid (VITAMIN C) 1000 MG tablet, Take 1,000 mg by mouth daily., Disp: , Rfl:  .  aspirin EC 81 MG tablet, Take 81 mg by mouth daily. Swallow whole., Disp: , Rfl:  .  Blood Glucose Monitoring Suppl (GLUCOCOM BLOOD GLUCOSE MONITOR) DEVI, One Touch Verio Meter. FSBS BID. E11.319, Disp: , Rfl:  .  Cholecalciferol (VITAMIN D3 PO), Take 5,000 Units by mouth daily., Disp: , Rfl:  .  dorzolamide-timolol (COSOPT) 22.3-6.8 MG/ML ophthalmic solution, Place 1 drop into both eyes 2 (two) times daily., Disp: , Rfl:  .  JARDIANCE 25 MG TABS tablet, Take 25 mg by mouth daily., Disp: , Rfl:  .  LINZESS 72 MCG capsule, Take 72 mcg by mouth daily as needed (abdominal cramping). , Disp: , Rfl:  .  MAGNESIUM PO, Take 250 mg by mouth daily., Disp: , Rfl:  .  metFORMIN (GLUCOPHAGE) 500 MG tablet, Take 1,000 mg by mouth 2 (two) times daily with a meal. , Disp: , Rfl:  .  pantoprazole (PROTONIX) 40 MG tablet, Take 40 mg by mouth daily., Disp: , Rfl:  .  rosuvastatin (CRESTOR) 10 MG tablet, Take 1 tablet (10 mg total)  by mouth at bedtime for 90 doses., Disp: 90  tablet, Rfl: 0 .  hydrochlorothiazide (HYDRODIURIL) 25 MG tablet, Take 1 tablet (25 mg total) by mouth in the morning., Disp: 90 tablet, Rfl: 0  Orders Placed This Encounter  Procedures  . Basic metabolic panel  . Magnesium  . EKG 12-Lead   --Continue cardiac medications as reconciled in final medication list. --Return in about 6 months (around 07/13/2020) for followup, CAD. Or sooner if needed. --Continue follow-up with your primary care physician regarding the management of your other chronic comorbid conditions.  Patient's questions and concerns were addressed to her satisfaction. She voices understanding of the instructions provided during this encounter.   This note was created using a voice recognition software as a result there may be grammatical errors inadvertently enclosed that do not reflect the nature of this encounter. Every attempt is made to correct such errors.  Total time spent: 33 minutes  Delilah Shan [facility]  Pager: [phone] Office: [phone]

## 2020-01-31 LAB — BASIC METABOLIC PANEL
BUN/Creatinine Ratio: 21 (ref 12–28)
BUN: 15 mg/dL (ref 8–27)
CO2: 19 mmol/L — ABNORMAL LOW (ref 20–29)
Calcium: 9.6 mg/dL (ref 8.7–10.3)
Chloride: 102 mmol/L (ref 96–106)
Creatinine, Ser: 0.73 mg/dL (ref 0.57–1.00)
GFR calc Af Amer: 97 mL/min/{1.73_m2} (ref >59)
GFR calc non Af Amer: 84 mL/min/{1.73_m2} (ref >59)
Glucose: 123 mg/dL — ABNORMAL HIGH (ref 65–99)
Potassium: 4.5 mmol/L (ref 3.5–5.2)
Sodium: 139 mmol/L (ref 134–144)

## 2020-01-31 LAB — MAGNESIUM: Magnesium: 1.9 mg/dL (ref 1.6–2.3)

## 2020-02-01 NOTE — Progress Notes (Signed)
Called pt to inform her about her lab results. Pt understood

## 2020-02-01 NOTE — Progress Notes (Signed)
Spoke with patient. Patient voiced understanding.

## 2020-02-11 ENCOUNTER — Encounter (INDEPENDENT_AMBULATORY_CARE_PROVIDER_SITE_OTHER): Payer: Self-pay | Admitting: Ophthalmology

## 2020-02-11 ENCOUNTER — Ambulatory Visit (INDEPENDENT_AMBULATORY_CARE_PROVIDER_SITE_OTHER): Payer: Medicare Other | Admitting: Ophthalmology

## 2020-02-11 ENCOUNTER — Other Ambulatory Visit: Payer: Self-pay

## 2020-02-11 DIAGNOSIS — H353221 Exudative age-related macular degeneration, left eye, with active choroidal neovascularization: Secondary | ICD-10-CM | POA: Diagnosis not present

## 2020-02-11 DIAGNOSIS — H318 Other specified disorders of choroid: Secondary | ICD-10-CM | POA: Diagnosis not present

## 2020-02-11 DIAGNOSIS — Z961 Presence of intraocular lens: Secondary | ICD-10-CM

## 2020-02-11 DIAGNOSIS — H33102 Unspecified retinoschisis, left eye: Secondary | ICD-10-CM | POA: Diagnosis not present

## 2020-02-11 DIAGNOSIS — H3581 Retinal edema: Secondary | ICD-10-CM

## 2020-02-11 DIAGNOSIS — H353112 Nonexudative age-related macular degeneration, right eye, intermediate dry stage: Secondary | ICD-10-CM | POA: Diagnosis not present

## 2020-02-11 DIAGNOSIS — I10 Essential (primary) hypertension: Secondary | ICD-10-CM

## 2020-02-11 DIAGNOSIS — H35033 Hypertensive retinopathy, bilateral: Secondary | ICD-10-CM | POA: Diagnosis not present

## 2020-02-11 MED ORDER — BEVACIZUMAB CHEMO INJECTION 1.25MG/0.05ML SYRINGE FOR KALEIDOSCOPE
1.2500 mg | INTRAVITREAL | Status: AC | PRN
  Administered 2020-02-11: 1.25 mg via INTRAVITREAL

## 2020-02-11 NOTE — Progress Notes (Addendum)
[facility] - Clinic Note  02/11/2020     CHIEF COMPLAINT Patient presents for Retina Evaluation   HISTORY OF PRESENT ILLNESS: Kylie Wilson is a 69 y.o. female who presents to the clinic today for:   HPI    Retina Evaluation    In left eye.  Associated Symptoms Flashes and Pain.  I, the attending physician,  performed the HPI with the patient and updated documentation appropriately.          Comments    Patient here for Retinal Evaluation for possible RD. Patient states vision not well in OS. Saw flashing lights 4 times last week OS. Runs water. Feels losing sight. Sees red lines. Had some bleeding going on. Had a little pain OD last night. Uses Dorzolamide/Timolol BID OU. Stopped [place]. Doesn't have glasses. Waiting until Jan to get glasses.       Last edited by Kylie Chris, MD on 02/11/2020  1:19 PM. (History)    hx of PCV per [facility], last seen at [facility] in 2017 (Dr. Dorette Wilson), pt states she feels like a part of her vision is missing temporally from her left eye, she feels like her eye is watering a lot and "pulling", pt has had cataract sx with Dr. Randon Wilson, pt is diabetic and states her A1c should be under 7, pt uses Systane and Lumify eye drops, she used to take Preservision, but has stopped  Referring physician: No referring provider defined for this encounter.  HISTORICAL INFORMATION:   Selected notes from the MEDICAL RECORD NUMBER Previous Kylie Wilson pt (2016) self-referred for possible RD LEE:  Ocular Hx- PCV diagnosed by Dr. Dorette Wilson at [facility]; followed w/ Dr. Rhina Wilson for DM PMH-    CURRENT MEDICATIONS: Current Outpatient Medications (Ophthalmic Drugs)  Medication Sig  . dorzolamide-timolol (COSOPT) 22.3-6.8 MG/ML ophthalmic solution Place 1 drop into both eyes 2 (two) times daily.   No current facility-administered medications for this visit. (Ophthalmic Drugs)   Current Outpatient Medications (Other)  Medication Sig  . ACCU-CHEK  AVIVA PLUS test strip USE TO TEST BLOOD SUGARS DAILY.  Marland Kitchen amlodipine-olmesartan (AZOR) 10-20 MG tablet Take 1 tablet by mouth daily.  . Ascorbic Acid (VITAMIN C) 1000 MG tablet Take 1,000 mg by mouth daily.  Marland Kitchen aspirin EC 81 MG tablet Take 81 mg by mouth daily. Swallow whole.  . Blood Glucose Monitoring Suppl (GLUCOCOM BLOOD GLUCOSE MONITOR) DEVI One Touch Verio Meter. FSBS BID. E11.319  . Cholecalciferol (VITAMIN D3 PO) Take 5,000 Units by mouth daily.  . hydrochlorothiazide (HYDRODIURIL) 25 MG tablet Take 1 tablet (25 mg total) by mouth in the morning.  Marland Kitchen JARDIANCE 25 MG TABS tablet Take 25 mg by mouth daily.  Marland Kitchen LINZESS 72 MCG capsule Take 72 mcg by mouth daily as needed (abdominal cramping).   Marland Kitchen MAGNESIUM PO Take 250 mg by mouth daily.  . metFORMIN (GLUCOPHAGE) 500 MG tablet Take 1,000 mg by mouth 2 (two) times daily with a meal.   . pantoprazole (PROTONIX) 40 MG tablet Take 40 mg by mouth daily.  . rosuvastatin (CRESTOR) 10 MG tablet Take 1 tablet (10 mg total) by mouth at bedtime for 90 doses.   No current facility-administered medications for this visit. (Other)      REVIEW OF SYSTEMS: ROS    Positive for: Neurological, Endocrine, Cardiovascular, Eyes   Last edited by Kylie Wilson, COA on 02/11/2020  1:10 PM. (History)       ALLERGIES Allergies  Allergen Reactions  . Duloxetine  Other (See Comments)    Pt says it also caused high pressure.  . Lisinopril Other (See Comments)    cough    PAST MEDICAL HISTORY Past Medical History:  Diagnosis Date  . Arthritis   . Barrett's esophagus   . Burning pain   . Cervical radiculopathy   . Cervical spondylosis   . Coronary artery calcification   . DDD (degenerative disc disease), cervical   . Diabetes mellitus    type 2   . GERD (gastroesophageal reflux disease)   . Hyperlipidemia   . Hypertension   . Obesity   . Rectocele   . Sleep apnea    does not tolerate cpap  . Uterine prolapse    Past Surgical History:   Procedure Laterality Date  . BREAST BIOPSY  1980   left  . BREAST EXCISIONAL BIOPSY Left    Bening 1980's  . SCAR REVISION  2001   both legs  . TOE SURGERY  1981   4th and 5th toe right foot  . TOE SURGERY  2002   great toe left  . TONSILLECTOMY    . TUBAL LIGATION      FAMILY HISTORY Family History  Problem Relation Age of Onset  . Heart disease Father   . Pancreatic cancer Father   . Breast cancer Paternal Aunt   . Stroke Mother   . Heart disease Brother   . Colon cancer Neg Hx   . Stomach cancer Neg Hx   . Colon polyps Neg Hx   . Rectal cancer Neg Hx     SOCIAL HISTORY Social History   Tobacco Use  . Smoking status: Former Smoker    Packs/day: 0.80    Years: 30.00    Pack years: 24.00    Types: Cigarettes    Quit date: 09/15/2011    Years since quitting: 8.4  . Smokeless tobacco: Never Used  . Tobacco comment: smoked 20 years; then quit 10; smoked 30 years plus  Vaping Use  . Vaping Use: Never used  Substance Use Topics  . Alcohol use: Yes    Comment: Every evening, 3 glasses of wine   . Drug use: No         OPHTHALMIC EXAM:  Base Eye Exam    Visual Acuity (Snellen - Linear)      Right Left   Dist [place] 20/30 -2 20/25 -1   Dist ph [place] 20/25 -1 NI       Tonometry (Tonopen, 1:04 PM)      Right Left   Pressure 20 21       Pupils      Dark Light Shape React APD   Right 3 2 Round Brisk None   Left 3 2 Round Brisk None       Visual Fields      Left Right   Restrictions Partial outer inferior temporal deficiency        Extraocular Movement      Right Left    Full, Ortho Full, Ortho       Neuro/Psych    Oriented x3: Yes   Mood/Affect: Normal       Dilation    Both eyes: 1.0% Mydriacyl, 2.5% Phenylephrine @ 1:04 PM        Slit Lamp and Fundus Exam    Slit Lamp Exam      Right Left   Lids/Lashes Dermatochalasis - upper lid, Meibomian gland dysfunction Dermatochalasis - upper lid, Meibomian gland dysfunction  Conjunctiva/Sclera  Melanosis Melanosis, trace Injection   Cornea trace Punctate epithelial erosions, arcus trace Punctate epithelial erosions, arcus   Anterior Chamber Deep and quiet Deep and quiet   Iris Round and moderately dilated to 5.78mm, No NVI Round and moderately dilated to 5.83mm, No NVI   Lens PC IOL in good position with open PC PC IOL in good position with open PC   Vitreous Vitreous syneresis, scattered silicone oil bubbles Vitreous syneresis, silicone oil bubbles       Fundus Exam      Right Left   Disc Trace Pallor, +cupping, Thin inferior rim Pink and Sharp, Peripapillary atrophy   C/D Ratio 0.6 0.5   Macula Flat, Blunted foveal reflex, Drusen, RPE mottling and clumping, No heme or edema Blunted foveal reflex, RPE clumping and atrophy, +SRF/edema infeior macula, mild IRH, +ERM   Vessels attenuated, mild Copper wiring, AV crossing changes attenuated, mild Copper wiring, AV crossing changes   Periphery Attached    Attached, bullous schisis cavity from 0200-0430; no RT/RD        Refraction    Manifest Refraction      Sphere Cylinder Axis Dist VA   Right -1.25 +0.50 015 20/30-1   Left -0.75 +0.75 160 20/30+1          IMAGING AND PROCEDURES  Imaging and Procedures for 02/11/2020  OCT, Retina - OU - Both Eyes       Right Eye Quality was good. Central Foveal Thickness: 216. Progression has no prior data. Findings include no IRF, no SRF, retinal drusen , outer retinal atrophy, vitreomacular adhesion , normal foveal contour.   Left Eye Quality was good. Central Foveal Thickness: 236. Progression has no prior data. Findings include normal foveal contour, no IRF, retinal drusen , subretinal hyper-reflective material, subretinal fluid, pigment epithelial detachment, choroidal neovascular membrane, outer retinal atrophy, vitreomacular adhesion  (Temporal bullous schisis cavity temporal periphery caught on widefield).   Notes *Images captured and stored on drive  Diagnosis / Impression:   OD: nonexudative ARMD OS: exudative ARMD v PCV w/ peripapillary CNV, PED and +SRF  Clinical management:  See below  Abbreviations: NFP - Normal foveal profile. CME - cystoid macular edema. PED - pigment epithelial detachment. IRF - intraretinal fluid. SRF - subretinal fluid. EZ - ellipsoid zone. ERM - epiretinal membrane. ORA - outer retinal atrophy. ORT - outer retinal tubulation. SRHM - subretinal hyper-reflective material. IRHM - intraretinal hyper-reflective material        Fluorescein Angiography Optos (Transit OS)       Right Eye   Progression has no prior data. Early phase findings include vascular perfusion defect. Mid/Late phase findings include staining, leakage (Mild hyperfluorescent staining superior to fovea, peripheral perivascular leakage temporal periphery).   Left Eye   Progression has no prior data. Early phase findings include staining, window defect, leakage. Mid/Late phase findings include staining, leakage (Focal staining of peripapillary CNV, focal hyperfluorescent staining along posterior border of schisis cavity, temporal periphery).   Notes **Images stored on drive**  Impression: Exudative ARMD v PCV OU OD: Mild hyperfluorescent staining superior to fovea, peripheral perivascular leakage temporal periphery OS: Focal staining of peripapillary CNV, focal hyperfluorescent staining along posterior border of schisis cavity, temporal periphery         Intravitreal Injection, Pharmacologic Agent - OS - Left Eye       Time Out 02/11/2020. 3:19 PM. Confirmed correct patient, procedure, site, and patient consented.   Anesthesia Topical anesthesia was used. Anesthetic medications included Lidocaine  2%, Proparacaine 0.5%.   Procedure Preparation included eyelid speculum, 5% betadine to ocular surface. A supplied needle was used.   Injection:  1.25 mg Bevacizumab (AVASTIN) SOLN   NDC: 78938-101-75, Lot: [id number] , Expiration date: 03/11/2020    Route: Intravitreal, Site: Left Eye, Waste: 0 mL  Post-op Post injection exam found visual acuity of at least counting fingers. The patient tolerated the procedure well. There were no complications. The patient received written and verbal post procedure care education. Post injection medications were not given.                ASSESSMENT/PLAN:    ICD-10-CM   1. Exudative age-related macular degeneration of left eye with active choroidal neovascularization (HCC)  H35.3221 Intravitreal Injection, Pharmacologic Agent - OS - Left Eye    Bevacizumab (AVASTIN) SOLN 1.25 mg  2. 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 Retinal edema  H35.81 OCT, Retina - OU - Both Eyes  6. Essential hypertension  I10   7. Hypertensive retinopathy of both eyes  H35.033 Fluorescein Angiography Optos (Transit OS)  8. Pseudophakia, both eyes  Z96.1     1-3. Idiopathic polypoidal choroidal vasculopathy (PCV) OU  - Exudative age related macular degeneration, OS  - Nonexudative ARMD OD  - was seen in 2016 here by JDM  - transferred care to [facility] w/ Dr. [facility] -- received intravitreal injxns  - then transferred to Dr. Dorette Wilson -- last visit 07/2019 -- IVE OD #7 for DME  - has seen Dr. 08/2019 twice  - The incidence pathology and anatomy of wet AMD discussed   - discussed treatment options including observation vs intravitreal anti-VEGF agents such as Avastin, Lucentis, Eylea.    - Risks of endophthalmitis and vascular occlusive events and atrophic changes discussed with patient  - OCT shows peripapillary CNV w/ +SRF OS; OD without exudative disease  - FA (11.29.21) shows peripapillary CNV OS + peripheral leakage OU consistent w/ PCV  - recommend IVA OS #1 today, 11.29.21 - pt  wishes to be treated with IVA - RBA of procedure discussed, questions answered - informed consent obtained, signed and scanned, 11.29.21 (IVA OS) - see procedure note  - f/u in 2 wks -- DFE/OCT, possible ICG OS  4,5. Retinoschisis OS  - focal, bullous schisis cavity in temporal periphery 2-430  - widefield OCT confirms schisis  - discussed findings, prognosis and treatment options  - recommend monitoring for now  - may need laser retinopexy if schisis progresses posteriorly  6,7. Hypertensive retinopathy OU - discussed importance of tight BP control - monitor  8. Pseudophakia OU  - s/p CE/IOL (Dr. 12.01.21)  - IOL in good position, doing well - monitor   Ophthalmic Meds Ordered this visit:  Meds ordered this encounter  Medications  . Bevacizumab (AVASTIN) SOLN 1.25 mg       Return in about 2 weeks (around 02/25/2020) for f/u exu ARMD OS, DFE, OCT.  There are no Patient Instructions on file for this visit.   Explained the diagnoses, plan, and follow up with the patient and they expressed understanding.  Patient expressed understanding of the importance of proper follow up care.   This document serves as a record of services personally performed by 02/27/2020, MD, PhD. It was created  on their behalf by Glee ArvinAmanda J. Manson PasseyBrown, OA an ophthalmic technician. The creation of this record is the provider's dictation and/or activities during the visit.    Electronically signed by: Glee ArvinAmanda J. Manson PasseyBrown, [place] 11.29.2021 10:57 PM   Karie ChimeraBrian G. Dyron Kawano, M.D., Ph.D. Diseases & Surgery of the Retina and Vitreous [facility]  I have reviewed the above documentation for accuracy and completeness, and I agree with the above. Karie ChimeraBrian G. Jennfer Gassen, M.D., Ph.D. 02/11/20 10:57 PM   Abbreviations: M myopia (nearsighted); A astigmatism; H hyperopia (farsighted); P presbyopia; Mrx spectacle prescription;  CTL contact lenses; OD right eye; OS left eye; OU both eyes  XT exotropia; ET  esotropia; PEK punctate epithelial keratitis; PEE punctate epithelial erosions; DES dry eye syndrome; MGD meibomian gland dysfunction; ATs artificial tears; PFAT's preservative free artificial tears; NSC nuclear sclerotic cataract; PSC posterior subcapsular cataract; ERM epi-retinal membrane; PVD posterior vitreous detachment; RD retinal detachment; DM diabetes mellitus; DR diabetic retinopathy; NPDR non-proliferative diabetic retinopathy; PDR proliferative diabetic retinopathy; CSME clinically significant macular edema; DME diabetic macular edema; dbh dot blot hemorrhages; CWS cotton wool spot; POAG primary open angle glaucoma; C/D cup-to-disc ratio; HVF humphrey visual field; GVF goldmann visual field; OCT optical coherence tomography; IOP intraocular pressure; BRVO Branch retinal vein occlusion; CRVO central retinal vein occlusion; CRAO central retinal artery occlusion; BRAO branch retinal artery occlusion; RT retinal tear; SB scleral buckle; PPV pars plana vitrectomy; VH Vitreous hemorrhage; PRP panretinal laser photocoagulation; IVK intravitreal kenalog; VMT vitreomacular traction; MH Macular hole;  NVD neovascularization of the disc; NVE neovascularization elsewhere; AREDS age related eye disease study; ARMD age related macular degeneration; POAG primary open angle glaucoma; EBMD epithelial/anterior basement membrane dystrophy; ACIOL anterior chamber intraocular lens; IOL intraocular lens; PCIOL posterior chamber intraocular lens; Phaco/IOL phacoemulsification with intraocular lens placement; PRK photorefractive keratectomy; LASIK laser assisted in situ keratomileusis; HTN hypertension; DM diabetes mellitus; COPD chronic obstructive pulmonary disease

## 2020-02-21 NOTE — Progress Notes (Shared)
[facility] - Clinic Note  02/25/2020     CHIEF COMPLAINT Patient presents for No chief complaint on file.   HISTORY OF PRESENT ILLNESS: Kylie Wilson is a 69 y.o. female who presents to the clinic today for:   hx of PCV per [facility], last seen at [facility] in 2017 (Dr. Dorette Wilson), pt states she feels like a part of her vision is missing temporally from her left eye, she feels like her eye is watering a lot and "pulling", pt has had cataract sx with Dr. Randon Goldsmith, pt is diabetic and states her A1c should be under 7, pt uses Systane and Lumify eye drops, she used to take Preservision, but has stopped  Referring physician: Truett Perna, MD [address] [place],  [place] [address]  HISTORICAL INFORMATION:   Selected notes from the MEDICAL RECORD NUMBER Previous Kylie Wilson pt (2016) self-referred for possible RD LEE:  Ocular Hx- PCV diagnosed by Dr. Dorette Wilson at [facility]; followed w/ Dr. Rhina Wilson for DM PMH-    CURRENT MEDICATIONS: Current Outpatient Medications (Ophthalmic Drugs)  Medication Sig  . dorzolamide-timolol (COSOPT) 22.3-6.8 MG/ML ophthalmic solution Place 1 drop into both eyes 2 (two) times daily.   No current facility-administered medications for this visit. (Ophthalmic Drugs)   Current Outpatient Medications (Other)  Medication Sig  . ACCU-CHEK AVIVA PLUS test strip USE TO TEST BLOOD SUGARS DAILY.  Marland Kitchen amlodipine-olmesartan (AZOR) 10-20 MG tablet Take 1 tablet by mouth daily.  . Ascorbic Acid (VITAMIN C) 1000 MG tablet Take 1,000 mg by mouth daily.  Marland Kitchen aspirin EC 81 MG tablet Take 81 mg by mouth daily. Swallow whole.  . Blood Glucose Monitoring Suppl (GLUCOCOM BLOOD GLUCOSE MONITOR) DEVI One Touch Verio Meter. FSBS BID. E11.319  . Cholecalciferol (VITAMIN D3 PO) Take 5,000 Units by mouth daily.  . hydrochlorothiazide (HYDRODIURIL) 25 MG tablet Take 1 tablet (25 mg total) by mouth in the morning.  Marland Kitchen JARDIANCE 25 MG TABS tablet Take 25 mg by mouth  daily.  Marland Kitchen LINZESS 72 MCG capsule Take 72 mcg by mouth daily as needed (abdominal cramping).   Marland Kitchen MAGNESIUM PO Take 250 mg by mouth daily.  . metFORMIN (GLUCOPHAGE) 500 MG tablet Take 1,000 mg by mouth 2 (two) times daily with a meal.   . pantoprazole (PROTONIX) 40 MG tablet Take 40 mg by mouth daily.  . rosuvastatin (CRESTOR) 10 MG tablet Take 1 tablet (10 mg total) by mouth at bedtime for 90 doses.   No current facility-administered medications for this visit. (Other)      REVIEW OF SYSTEMS:    ALLERGIES Allergies  Allergen Reactions  . Duloxetine Other (See Comments)    Pt says it also caused high pressure.  . Lisinopril Other (See Comments)    cough    PAST MEDICAL HISTORY Past Medical History:  Diagnosis Date  . Arthritis   . Barrett's esophagus   . Burning pain   . Cervical radiculopathy   . Cervical spondylosis   . Coronary artery calcification   . DDD (degenerative disc disease), cervical   . Diabetes mellitus    type 2   . GERD (gastroesophageal reflux disease)   . Hyperlipidemia   . Hypertension   . Obesity   . Rectocele   . Sleep apnea    does not tolerate cpap  . Uterine prolapse    Past Surgical History:  Procedure Laterality Date  . BREAST BIOPSY  1980   left  . BREAST EXCISIONAL BIOPSY  Left    Bening 1980's  . SCAR REVISION  2001   both legs  . TOE SURGERY  1981   4th and 5th toe right foot  . TOE SURGERY  2002   great toe left  . TONSILLECTOMY    . TUBAL LIGATION      FAMILY HISTORY Family History  Problem Relation Age of Onset  . Heart disease Father   . Pancreatic cancer Father   . Breast cancer Paternal Aunt   . Stroke Mother   . Heart disease Brother   . Colon cancer Neg Hx   . Stomach cancer Neg Hx   . Colon polyps Neg Hx   . Rectal cancer Neg Hx     SOCIAL HISTORY Social History   Tobacco Use  . Smoking status: Former Smoker    Packs/day: 0.80    Years: 30.00    Pack years: 24.00    Types: Cigarettes    Quit  date: 09/15/2011    Years since quitting: 8.4  . Smokeless tobacco: Never Used  . Tobacco comment: smoked 20 years; then quit 10; smoked 30 years plus  Vaping Use  . Vaping Use: Never used  Substance Use Topics  . Alcohol use: Yes    Comment: Every evening, 3 glasses of wine   . Drug use: No         OPHTHALMIC EXAM:  Not recorded     IMAGING AND PROCEDURES  Imaging and Procedures for 02/25/2020          ASSESSMENT/PLAN:    ICD-10-CM   1. Exudative age-related macular degeneration of left eye with active choroidal neovascularization (HCC)  H35.3221   2. 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 Retinal edema  H35.81   6. Essential hypertension  I10   7. Hypertensive retinopathy of both eyes  H35.033   8. Pseudophakia, both eyes  Z96.1     1-3. Idiopathic polypoidal choroidal vasculopathy (PCV) OU  - S/P IVA OS #1 (11.29.21)             - Exudative age related macular degeneration, OS  - Nonexudative ARMD OD  - was seen in 2016 here by Kylie Wilson  - transferred care to [facility] w/ Dr. Dorette Wilson -- received intravitreal injxns  - then transferred to Dr. Quinn Wilson -- last visit 07/2019 -- IVE OD #7 for DME  - has seen Kylie Wilson twice  - The incidence pathology and anatomy of wet AMD discussed   - discussed treatment options including observation vs intravitreal anti-VEGF agents such as Avastin, Lucentis, Eylea.    - Risks of endophthalmitis and vascular occlusive events and atrophic changes discussed with patient  - OCT shows peripapillary CNV w/ +SRF OS; OD without exudative disease  - FA (11.29.21) shows peripapillary CNV OS + peripheral leakage OU consistent w/ PCV  - recommend IVA OS #1 today, 11.29.21 - pt wishes to be treated with IVA - RBA of procedure discussed, questions answered - informed consent obtained, signed and scanned,  11.29.21 (IVA OS) - see procedure note  - f/u in 2 wks -- DFE/OCT, possible ICG OS  4,5. Retinoschisis OS  - focal, bullous schisis cavity in temporal periphery 2-430  - widefield OCT confirms schisis  - discussed findings, prognosis and treatment options  - recommend monitoring for now  - may need laser retinopexy if schisis  progresses posteriorly  6,7. Hypertensive retinopathy OU - discussed importance of tight BP control - monitor  8. Pseudophakia OU  - s/p CE/IOL (Dr. Randon Goldsmith)  - IOL in good position, doing well - monitor   Ophthalmic Meds Ordered this visit:  No orders of the defined types were placed in this encounter.      No follow-ups on file.  There are no Patient Instructions on file for this visit.  This document serves as a record of services personally performed by Karie Chimera, MD, PhD. It was created on their behalf by Herby Abraham, COA, an ophthalmic technician. The creation of this record is the provider's dictation and/or activities during the visit.    Electronically signed by: Herby Abraham, COA @TODAY @ 8:21 AM   Abbreviations: M myopia (nearsighted); A astigmatism; H hyperopia (farsighted); P presbyopia; Mrx spectacle prescription;  CTL contact lenses; OD right eye; OS left eye; OU both eyes  XT exotropia; ET esotropia; PEK punctate epithelial keratitis; PEE punctate epithelial erosions; DES dry eye syndrome; MGD meibomian gland dysfunction; ATs artificial tears; PFAT's preservative free artificial tears; NSC nuclear sclerotic cataract; PSC posterior subcapsular cataract; ERM epi-retinal membrane; PVD posterior vitreous detachment; RD retinal detachment; DM diabetes mellitus; DR diabetic retinopathy; NPDR non-proliferative diabetic retinopathy; PDR proliferative diabetic retinopathy; CSME clinically significant macular edema; DME diabetic macular edema; dbh dot blot hemorrhages; CWS cotton wool spot; POAG primary open angle glaucoma; C/D cup-to-disc  ratio; HVF humphrey visual field; GVF goldmann visual field; OCT optical coherence tomography; IOP intraocular pressure; BRVO Branch retinal vein occlusion; CRVO central retinal vein occlusion; CRAO central retinal artery occlusion; BRAO branch retinal artery occlusion; RT retinal tear; SB scleral buckle; PPV pars plana vitrectomy; VH Vitreous hemorrhage; PRP panretinal laser photocoagulation; IVK intravitreal kenalog; VMT vitreomacular traction; MH Macular hole;  NVD neovascularization of the disc; NVE neovascularization elsewhere; AREDS age related eye disease study; ARMD age related macular degeneration; POAG primary open angle glaucoma; EBMD epithelial/anterior basement membrane dystrophy; ACIOL anterior chamber intraocular lens; IOL intraocular lens; PCIOL posterior chamber intraocular lens; Phaco/IOL phacoemulsification with intraocular lens placement; PRK photorefractive keratectomy; LASIK laser assisted in situ keratomileusis; HTN hypertension; DM diabetes mellitus; COPD chronic obstructive pulmonary disease

## 2020-02-25 ENCOUNTER — Other Ambulatory Visit: Payer: Self-pay

## 2020-02-25 ENCOUNTER — Ambulatory Visit (INDEPENDENT_AMBULATORY_CARE_PROVIDER_SITE_OTHER): Payer: Medicare Other | Admitting: Ophthalmology

## 2020-02-25 ENCOUNTER — Encounter (INDEPENDENT_AMBULATORY_CARE_PROVIDER_SITE_OTHER): Payer: Self-pay | Admitting: Ophthalmology

## 2020-02-25 ENCOUNTER — Encounter (INDEPENDENT_AMBULATORY_CARE_PROVIDER_SITE_OTHER): Payer: Medicare Other | Admitting: Ophthalmology

## 2020-02-25 DIAGNOSIS — I10 Essential (primary) hypertension: Secondary | ICD-10-CM

## 2020-02-25 DIAGNOSIS — Z961 Presence of intraocular lens: Secondary | ICD-10-CM

## 2020-02-25 DIAGNOSIS — H318 Other specified disorders of choroid: Secondary | ICD-10-CM

## 2020-02-25 DIAGNOSIS — H33102 Unspecified retinoschisis, left eye: Secondary | ICD-10-CM

## 2020-02-25 DIAGNOSIS — H353221 Exudative age-related macular degeneration, left eye, with active choroidal neovascularization: Secondary | ICD-10-CM | POA: Diagnosis not present

## 2020-02-25 DIAGNOSIS — H3581 Retinal edema: Secondary | ICD-10-CM

## 2020-02-25 DIAGNOSIS — H35033 Hypertensive retinopathy, bilateral: Secondary | ICD-10-CM

## 2020-02-25 DIAGNOSIS — H353112 Nonexudative age-related macular degeneration, right eye, intermediate dry stage: Secondary | ICD-10-CM

## 2020-02-25 NOTE — Progress Notes (Signed)
[facility] - Clinic Note  02/25/2020     CHIEF COMPLAINT Patient presents for Retina Follow Up   HISTORY OF PRESENT ILLNESS: Kylie Wilson is a 69 y.o. 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  This started 2 weeks ago.  I, the attending physician,  performed the HPI with the patient and updated documentation appropriately.          Comments    Patient here for 2 weeks retina follow up for exu ARMD OS. Patient states vision no worse. Last time saw large floater. Now sees a lot of small floaters. No eye pain.        Last edited by Rennis ChrisZamora, Gila Lauf, MD on 02/25/2020 10:02 PM. (History)    pt states no problems with injection at last visit, she states her floaters are smaller now  Referring physician: Truett PernaWang, Yun, MD [address] [place],  [place] [phone]  HISTORICAL INFORMATION:   Selected notes from the MEDICAL RECORD NUMBER Previous [name] pt (2016) self-referred for possible RD LEE:  Ocular Hx- [facility]; followed w/ Dr. Rhina Brackettadiochencko for DM PMH-    CURRENT MEDICATIONS: Current Outpatient Medications (Ophthalmic Drugs)  Medication Sig  . dorzolamide-timolol (COSOPT) 22.3-6.8 MG/ML ophthalmic solution Place 1 drop into both eyes 2 (two) times daily.   No current facility-administered medications for this visit. (Ophthalmic Drugs)   Current Outpatient Medications (Other)  Medication Sig  . ACCU-CHEK AVIVA PLUS test strip USE TO TEST BLOOD SUGARS DAILY.  Marland Kitchen. amlodipine-olmesartan (AZOR) 10-20 MG tablet Take 1 tablet by mouth daily.  . Ascorbic Acid (VITAMIN C) 1000 MG tablet Take 1,000 mg by mouth daily.  Marland Kitchen. aspirin EC 81 MG tablet Take 81 mg by mouth daily. Swallow whole.  . Blood Glucose Monitoring Suppl (GLUCOCOM BLOOD GLUCOSE MONITOR) DEVI One Touch Verio Meter. FSBS BID. E11.319  . Cholecalciferol (VITAMIN D3 PO) Take  5,000 Units by mouth daily.  . hydrochlorothiazide (HYDRODIURIL) 25 MG tablet Take 1 tablet (25 mg total) by mouth in the morning.  Marland Kitchen. JARDIANCE 25 MG TABS tablet Take 25 mg by mouth daily.  Marland Kitchen. LINZESS 72 MCG capsule Take 72 mcg by mouth daily as needed (abdominal cramping).   Marland Kitchen. MAGNESIUM PO Take 250 mg by mouth daily.  . metFORMIN (GLUCOPHAGE) 500 MG tablet Take 1,000 mg by mouth 2 (two) times daily with a meal.   . pantoprazole (PROTONIX) 40 MG tablet Take 40 mg by mouth daily.  . rosuvastatin (CRESTOR) 10 MG tablet Take 1 tablet (10 mg total) by mouth at bedtime for 90 doses.   No current facility-administered medications for this visit. (Other)      REVIEW OF SYSTEMS: ROS    Positive for: Neurological, Endocrine, Cardiovascular, Eyes   Last edited by Laddie Aquaslarke, Rebecca S, COA on 02/25/2020  1:17 PM. (History)       ALLERGIES Allergies  Allergen Reactions  . Duloxetine Other (See Comments)    Pt says it also caused high pressure.  . Lisinopril Other (See Comments)    cough    PAST MEDICAL HISTORY Past Medical History:  Diagnosis Date  . Arthritis   . Barrett's esophagus   . Burning pain   . Cervical radiculopathy   . Cervical spondylosis   . Coronary artery calcification   . DDD (degenerative disc  disease), cervical   . Diabetes mellitus    type 2   . GERD (gastroesophageal reflux disease)   . Hyperlipidemia   . Hypertension   . Obesity   . Rectocele   . Sleep apnea    does not tolerate cpap  . Uterine prolapse    Past Surgical History:  Procedure Laterality Date  . BREAST BIOPSY  1980   left  . BREAST EXCISIONAL BIOPSY Left    Bening 1980's  . SCAR REVISION  2001   both legs  . TOE SURGERY  1981   4th and 5th toe right foot  . TOE SURGERY  2002   great toe left  . TONSILLECTOMY    . TUBAL LIGATION      FAMILY HISTORY Family History  Problem Relation Age of Onset  . Heart disease Father   . Pancreatic cancer Father   . Breast cancer Paternal Aunt    . Stroke Mother   . Heart disease Brother   . Colon cancer Neg Hx   . Stomach cancer Neg Hx   . Colon polyps Neg Hx   . Rectal cancer Neg Hx     SOCIAL HISTORY Social History   Tobacco Use  . Smoking status: Former Smoker    Packs/day: 0.80    Years: 30.00    Pack years: 24.00    Types: Cigarettes    Quit date: 09/15/2011    Years since quitting: 8.4  . Smokeless tobacco: Never Used  . Tobacco comment: smoked 20 years; then quit 10; smoked 30 years plus  Vaping Use  . Vaping Use: Never used  Substance Use Topics  . Alcohol use: Yes    Comment: Every evening, 3 glasses of wine   . Drug use: No         OPHTHALMIC EXAM:  Base Eye Exam    Visual Acuity (Snellen - Linear)      Right Left   Dist Fleming 20/30 -2 20/25 -2   Dist ph Datil 20/25 -2 NI       Tonometry (Tonopen, 1:13 PM)      Right Left   Pressure 17 16       Pupils      Dark Light Shape React APD   Right 3 2 Round Brisk None   Left 3 2 Round Brisk None       Visual Fields (Counting fingers)      Left Right     Full   Restrictions Partial outer inferior temporal deficiency        Extraocular Movement      Right Left    Full, Ortho Full, Ortho       Neuro/Psych    Oriented x3: Yes   Mood/Affect: Normal       Dilation    Both eyes: 1.0% Mydriacyl, 2.5% Phenylephrine @ 1:13 PM        Slit Lamp and Fundus Exam    Slit Lamp Exam      Right Left   Lids/Lashes Dermatochalasis - upper lid, Meibomian gland dysfunction Dermatochalasis - upper lid, Meibomian gland dysfunction   Conjunctiva/Sclera Melanosis Melanosis, trace Injection   Cornea trace Punctate epithelial erosions, arcus trace Punctate epithelial erosions, arcus   Anterior Chamber Deep and quiet Deep and quiet   Iris Round and moderately dilated to 5.87mm, No NVI Round and moderately dilated to 5.84mm, No NVI   Lens PC IOL in good position with open PC PC IOL in good position  with open PC   Vitreous Vitreous syneresis, scattered silicone  oil bubbles Vitreous syneresis, silicone oil bubbles       Fundus Exam      Right Left   Disc Trace Pallor, +cupping, Thin inferior rim Pink and Sharp, Peripapillary atrophy   C/D Ratio 0.6 0.5   Macula Flat, Blunted foveal reflex, Drusen, RPE mottling and clumping, No heme or edema Blunted foveal reflex, RPE clumping and atrophy, persistent SRF/edema infeior macula, mild IRH, +ERM   Vessels attenuated, mild Copper wiring, AV crossing changes attenuated, mild Copper wiring, AV crossing changes   Periphery Attached    Attached, bullous schisis cavity from 0200-0430; no RT/RD          IMAGING AND PROCEDURES  Imaging and Procedures for 02/25/2020  OCT, Retina - OU - Both Eyes       Right Eye Quality was good. Central Foveal Thickness: 216. Progression has been stable. Findings include no IRF, no SRF, retinal drusen , outer retinal atrophy, vitreomacular adhesion , normal foveal contour.   Left Eye Quality was good. Central Foveal Thickness: 227. Progression has been stable. Findings include normal foveal contour, no IRF, retinal drusen , subretinal hyper-reflective material, subretinal fluid, pigment epithelial detachment, choroidal neovascular membrane, outer retinal atrophy, vitreomacular adhesion  (Persistent SRF/PEDs; PCV with peripapillary CNV -- no significant improvement).   Notes *Images captured and stored on drive  Diagnosis / Impression:  OD: nonexudative ARMD OS: Persistent SRF/PEDs; PCV/exu ARMD with peripapillary CNV -- no significant improvement  Clinical management:  See below  Abbreviations: NFP - Normal foveal profile. CME - cystoid macular edema. PED - pigment epithelial detachment. IRF - intraretinal fluid. SRF - subretinal fluid. EZ - ellipsoid zone. ERM - epiretinal membrane. ORA - outer retinal atrophy. ORT - outer retinal tubulation. SRHM - subretinal hyper-reflective material. IRHM - intraretinal hyper-reflective material        Color Fundus  Photography Optos - OU - Both Eyes       Right Eye Progression has no prior data. Disc findings include normal observations. Macula : retinal pigment epithelium abnormalities. Vessels : attenuated, tortuous vessels. Periphery : normal observations.   Left Eye Progression has no prior data. Disc findings include normal observations. Macula : retinal pigment epithelium abnormalities (Peripapillary CNV with pigment clumping and atrophy). Vessels : attenuated, tortuous vessels. Periphery : RPE abnormality (Bullous schisis cavity at 0300).   Notes **Images stored on drive**                 ASSESSMENT/PLAN:    ICD-10-CM   1. Exudative age-related macular degeneration of left eye with active choroidal neovascularization (HCC)  H35.3221 Color Fundus Photography Optos - OU - Both Eyes    CANCELED: ICG Angiography Optos (Transit OS)  2. 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 Intermediate stage nonexudative age-related macular degeneration of right eye  H35.3112   4. Left retinoschisis  H33.102 Color Fundus Photography Optos - OU - Both Eyes  5. Retinal edema  H35.81 OCT, Retina - OU - Both Eyes  6. Essential hypertension  I10   7. Hypertensive retinopathy of both eyes  H35.033   8. Pseudophakia, both eyes  Z96.1     1-3. Idiopathic polypoidal choroidal vasculopathy (PCV) OU  - Exudative age related macular degeneration, OS  - Nonexudative ARMD OD  - was seen in 2016 here by JDM  - transferred care to [facility] w/ Dr. Dorette Grate -- received intravitreal  injxns  - then transferred to Dr. Quinn Axe -- last visit 07/2019 -- IVE OD #7 for DME  - has seen Dr. Sherryll Burger twice  - The incidence pathology and anatomy of wet AMD discussed   - s/p IVA OS #1 (11.29.21)  - OCT shows persistent peripapillary CNV w/ +SRF OS; OD without exudative disease  - FA (11.29.21) shows peripapillary CNV OS + peripheral leakage OU consistent w/ PCV  - ICG  angiography ordered 12.13.21, but pt currently on metformin -- contraindicated - informed consent obtained, signed and scanned, 11.29.21 (IVA OS) - see procedure note  - f/u in 2 wks -- DFE/OCT  4,5. Retinoschisis OS  - focal, bullous schisis cavity in temporal periphery 2-430  - widefield OCT confirms schisis  - stable  - continue monitoring for now  - may need laser retinopexy if schisis progresses posteriorly  6,7. Hypertensive retinopathy OU - discussed importance of tight BP control - monitor  8. Pseudophakia OU  - s/p CE/IOL (Dr. Randon Goldsmith)  - IOL in good position, doing well - monitor   Ophthalmic Meds Ordered this visit:  No orders of the defined types were placed in this encounter.      Return in about 2 weeks (around 03/10/2020) for f/u PCV OU, DFE, OCT.  There are no Patient Instructions on file for this visit.   Explained the diagnoses, plan, and follow up with the patient and they expressed understanding.  Patient expressed understanding of the importance of proper follow up care.   This document serves as a record of services personally performed by Karie Chimera, MD, PhD. It was created on their behalf by Glee Arvin. Manson Passey, OA an ophthalmic technician. The creation of this record is the provider's dictation and/or activities during the visit.    Electronically signed by: Glee Arvin. Manson Passey, [place] 12.13.2021 10:17 PM   Karie Chimera, M.D., Ph.D. Diseases & Surgery of the Retina and Vitreous [facility]  I have reviewed the above documentation for accuracy and completeness, and I agree with the above. Karie Chimera, M.D., Ph.D. 02/25/20 10:17 PM   Abbreviations: M myopia (nearsighted); A astigmatism; H hyperopia (farsighted); P presbyopia; Mrx spectacle prescription;  CTL contact lenses; OD right eye; OS left eye; OU both eyes  XT exotropia; ET esotropia; PEK punctate epithelial keratitis; PEE punctate epithelial erosions; DES dry eye  syndrome; MGD meibomian gland dysfunction; ATs artificial tears; PFAT's preservative free artificial tears; NSC nuclear sclerotic cataract; PSC posterior subcapsular cataract; ERM epi-retinal membrane; PVD posterior vitreous detachment; RD retinal detachment; DM diabetes mellitus; DR diabetic retinopathy; NPDR non-proliferative diabetic retinopathy; PDR proliferative diabetic retinopathy; CSME clinically significant macular edema; DME diabetic macular edema; dbh dot blot hemorrhages; CWS cotton wool spot; POAG primary open angle glaucoma; C/D cup-to-disc ratio; HVF humphrey visual field; GVF goldmann visual field; OCT optical coherence tomography; IOP intraocular pressure; BRVO Branch retinal vein occlusion; CRVO central retinal vein occlusion; CRAO central retinal artery occlusion; BRAO branch retinal artery occlusion; RT retinal tear; SB scleral buckle; PPV pars plana vitrectomy; VH Vitreous hemorrhage; PRP panretinal laser photocoagulation; IVK intravitreal kenalog; VMT vitreomacular traction; MH Macular hole;  NVD neovascularization of the disc; NVE neovascularization elsewhere; AREDS age related eye disease study; ARMD age related macular degeneration; POAG primary open angle glaucoma; EBMD epithelial/anterior basement membrane dystrophy; ACIOL anterior chamber intraocular lens; IOL intraocular lens; PCIOL posterior chamber intraocular lens; Phaco/IOL phacoemulsification with intraocular lens placement; PRK photorefractive keratectomy; LASIK laser assisted in situ keratomileusis; HTN hypertension;  DM diabetes mellitus; COPD chronic obstructive pulmonary disease

## 2020-03-06 NOTE — Progress Notes (Signed)
[facility] - Clinic Note  03/14/2020     CHIEF COMPLAINT Patient presents for Retina Follow Up   HISTORY OF PRESENT ILLNESS: Kylie Wilson is a 69 y.o. 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  Severity is moderate.  Duration of 2 weeks.  Since onset it is gradually worsening.  I, the attending physician,  performed the HPI with the patient and updated documentation appropriately.          Comments    2 week follow up for Exu Armd. Patient states vision seems little worse today. Patient also had flashes of light in the left eye.        Last edited by Kylie Wilson, Kylie Pantaleo, MD on 03/14/2020  3:51 PM. (History)    pt states left eye vision seems a little worse today  Referring physician: Truett Wilson, Yun, MD [id number] [place],  [place] [id number]  HISTORICAL INFORMATION:   Selected notes from the MEDICAL RECORD NUMBER Previous Kylie Wilson pt (2016) self-referred for possible RD LEE:  Ocular Hx- PC[facility]; followed w/ Dr. Rhina Wilson for DM PMH-    CURRENT MEDICATIONS: Current Outpatient Medications (Ophthalmic Drugs)  Medication Sig  . dorzolamide-timolol (COSOPT) 22.3-6.8 MG/ML ophthalmic solution Place 1 drop into both eyes 2 (two) times daily.   No current facility-administered medications for this visit. (Ophthalmic Drugs)   Current Outpatient Medications (Other)  Medication Sig  . ACCU-CHEK AVIVA PLUS test strip USE TO TEST BLOOD SUGARS DAILY.  Marland Kitchen. amlodipine-olmesartan (AZOR) 10-20 MG tablet Take 1 tablet by mouth daily.  . Ascorbic Acid (VITAMIN C) 1000 MG tablet Take 1,000 mg by mouth daily.  Marland Kitchen. aspirin EC 81 MG tablet Take 81 mg by mouth daily. Swallow whole.  . Blood Glucose Monitoring Suppl (GLUCOCOM BLOOD GLUCOSE MONITOR) DEVI One Touch Verio Meter. FSBS BID. E11.319  . Cholecalciferol (VITAMIN D3 PO) Take 5,000 Units by mouth  daily.  . hydrochlorothiazide (HYDRODIURIL) 25 MG tablet Take 1 tablet (25 mg total) by mouth in the morning.  Marland Kitchen. JARDIANCE 25 MG TABS tablet Take 25 mg by mouth daily.  Marland Kitchen. LINZESS 72 MCG capsule Take 72 mcg by mouth daily as needed (abdominal cramping).   Marland Kitchen. MAGNESIUM PO Take 250 mg by mouth daily.  . metFORMIN (GLUCOPHAGE) 500 MG tablet Take 1,000 mg by mouth 2 (two) times daily with a meal.   . pantoprazole (PROTONIX) 40 MG tablet Take 40 mg by mouth daily.  . rosuvastatin (CRESTOR) 10 MG tablet Take 1 tablet (10 mg total) by mouth at bedtime for 90 doses.   No current facility-administered medications for this visit. (Other)      REVIEW OF SYSTEMS: ROS    Positive for: Neurological, Endocrine, Cardiovascular, Eyes   Last edited by Kylie Wilson, Kylie Wilson, COT on 03/14/2020  8:14 AM. (History)       ALLERGIES Allergies  Allergen Reactions  . Duloxetine Other (See Comments)    Pt says it also caused high pressure.  . Lisinopril Other (See Comments)    cough    PAST MEDICAL HISTORY Past Medical History:  Diagnosis Date  . Arthritis   . Barrett's esophagus   . Burning pain   . Cervical radiculopathy   . Cervical spondylosis   . Coronary artery calcification   . DDD (degenerative disc disease), cervical   .  Diabetes mellitus    type 2   . GERD (gastroesophageal reflux disease)   . Hyperlipidemia   . Hypertension   . Obesity   . Rectocele   . Sleep apnea    does not tolerate cpap  . Uterine prolapse    Past Surgical History:  Procedure Laterality Date  . BREAST BIOPSY  1980   left  . BREAST EXCISIONAL BIOPSY Left    Bening 1980's  . SCAR REVISION  2001   both legs  . TOE SURGERY  1981   4th and 5th toe right foot  . TOE SURGERY  2002   great toe left  . TONSILLECTOMY    . TUBAL LIGATION      FAMILY HISTORY Family History  Problem Relation Age of Onset  . Heart disease Father   . Pancreatic cancer Father   . Breast cancer Paternal Aunt   . Stroke Mother   .  Heart disease Brother   . Colon cancer Neg Hx   . Stomach cancer Neg Hx   . Colon polyps Neg Hx   . Rectal cancer Neg Hx     SOCIAL HISTORY Social History   Tobacco Use  . Smoking status: Former Smoker    Packs/day: 0.80    Years: 30.00    Pack years: 24.00    Types: Cigarettes    Quit date: 09/15/2011    Years since quitting: 8.5  . Smokeless tobacco: Never Used  . Tobacco comment: smoked 20 years; then quit 10; smoked 30 years plus  Vaping Use  . Vaping Use: Never used  Substance Use Topics  . Alcohol use: Yes    Comment: Every evening, 3 glasses of wine   . Drug use: No         OPHTHALMIC EXAM:  Base Eye Exam    Visual Acuity (Snellen - Linear)      Right Left   Dist [place] 20/30/-2 20/25-2   Dist ph [place] 20/20-2 20/NI       Tonometry (Tonopen, 8:25 AM)      Right Left   Pressure 16 16       Pupils      Dark Light Shape React APD   Right 3 2 Round Brisk None   Left 3 2 Round Brisk None       Visual Fields (Counting fingers)      Left Right    Full Full       Extraocular Movement      Right Left    Full, Ortho Full, Ortho       Neuro/Psych    Oriented x3: Yes   Mood/Affect: Normal       Dilation    Both eyes: 1.0% Mydriacyl, 2.5% Phenylephrine @ 8:24 AM        Slit Lamp and Fundus Exam    Slit Lamp Exam      Right Left   Lids/Lashes Dermatochalasis - upper lid, Meibomian gland dysfunction Dermatochalasis - upper lid, Meibomian gland dysfunction   Conjunctiva/Sclera Melanosis Melanosis, trace Injection   Cornea trace Punctate epithelial erosions, arcus trace Punctate epithelial erosions, arcus   Anterior Chamber Deep and quiet Deep and quiet   Iris Round and moderately dilated to 5.37mm, No NVI Round and moderately dilated to 5.48mm, No NVI   Lens PC IOL in good position with open PC PC IOL in good position with open PC   Vitreous Vitreous syneresis, scattered silicone oil bubbles Vitreous syneresis, silicone oil bubbles  Fundus Exam       Right Left   Disc Trace Pallor, +cupping, Thin inferior rim Pink and Sharp, Peripapillary atrophy   C/D Ratio 0.7 0.5   Macula Flat, Blunted foveal reflex, Drusen, RPE mottling and clumping, No heme or edema Blunted foveal reflex, RPE clumping and atrophy, peripapillary SRF/edema SN macula - improving, +ERM, pigmented atrophy/scarring IN macula   Vessels attenuated, mild Copper wiring, AV crossing changes attenuated, mild Copper wiring, AV crossing changes   Periphery Attached    Attached, bullous schisis cavity from 0200-0430; no RT/RD          IMAGING AND PROCEDURES  Imaging and Procedures for 03/14/2020  OCT, Retina - OU - Both Eyes       Right Eye Quality was good. Central Foveal Thickness: 218. Progression has been stable. Findings include no IRF, no SRF, retinal drusen , outer retinal atrophy, vitreomacular adhesion , normal foveal contour.   Left Eye Quality was good. Central Foveal Thickness: 227. Progression has improved. Findings include normal foveal contour, no IRF, retinal drusen , subretinal hyper-reflective material, subretinal fluid, pigment epithelial detachment, choroidal neovascular membrane, outer retinal atrophy, vitreomacular adhesion  (Persistent SRF/PEDs; PCV with peripapillary CNV -- mild interval improvement).   Notes *Images captured and stored on drive  Diagnosis / Impression:  OD: nonexudative ARMD OS: Persistent SRF/PEDs; PCV with peripapillary CNV -- mild interval improvement  Clinical management:  See below  Abbreviations: NFP - Normal foveal profile. CME - cystoid macular edema. PED - pigment epithelial detachment. IRF - intraretinal fluid. SRF - subretinal fluid. EZ - ellipsoid zone. ERM - epiretinal membrane. ORA - outer retinal atrophy. ORT - outer retinal tubulation. SRHM - subretinal hyper-reflective material. IRHM - intraretinal hyper-reflective material        Intravitreal Injection, Pharmacologic Agent - OS - Left Eye       Time  Out 03/14/2020. 9:19 AM. Confirmed correct patient, procedure, site, and patient consented.   Anesthesia Topical anesthesia was used. Anesthetic medications included Lidocaine 2%, Proparacaine 0.5%.   Procedure Preparation included eyelid speculum, 5% betadine to ocular surface. A supplied needle was used.   Injection:  1.25 mg Bevacizumab (AVASTIN) 1.25mg /0.27mL SOLN   NDC: 70360-001-02, Lot: [id number], Expiration date: 04/14/2020   Route: Intravitreal, Site: Left Eye, Waste: 0.05 mL  Post-op Post injection exam found visual acuity of at least counting fingers. The patient tolerated the procedure well. There were no complications. The patient received written and verbal post procedure care education. Post injection medications were not given.                ASSESSMENT/PLAN:    ICD-10-CM   1. 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 Retinal edema  H35.81 OCT, Retina - OU - Both Eyes  6. Essential hypertension  I10   7. Hypertensive retinopathy of both eyes  H35.033   8. Pseudophakia, both eyes  Z96.1     1-3. Idiopathic polypoidal choroidal vasculopathy (PCV) OU  - Exudative age related macular degeneration, OS  - Nonexudative ARMD OD  - was seen in 2016 here by JDM  - transferred care to [facility] w/ Dr. Dorette Grate -- received intravitreal injxns  -  then transferred to Dr. Quinn Axe -- last visit 07/2019 -- IVE OD #7 for DME  - has seen Dr. Sherryll Burger twice  - The incidence pathology and anatomy of wet AMD discussed   - s/p IVA OS #1 (11.29.21)  - OCT shows persistent peripapillary CNV w/ +SRF OS (SRF improving); OD without exudative  disease  - FA (11.29.21) shows peripapillary CNV OS + peripheral leakage OU consistent w/ PCV  - recommend IVA OS #2 today, 12.31.21  - pt wishes to proceed  - RBA of procedure discussed, questions answered  - informed consent obtained and signed  - see procedure note - informed consent obtained, signed and scanned, 11.29.21 (IVA OS) - see procedure note  - f/u in 4 wks -- DFE/OCT possible ICG  4,5. Retinoschisis OS  - focal, bullous schisis cavity in temporal periphery 2-430  - widefield OCT confirms schisis  - stable  - continue monitoring for now  - may need laser retinopexy if schisis progresses posteriorly  6,7. Hypertensive retinopathy OU - discussed importance of tight BP control - monitor  8. Pseudophakia OU  - s/p CE/IOL (Dr. Randon Goldsmith)  - IOL in good position, doing well - monitor   Ophthalmic Meds Ordered this visit:  Meds ordered this encounter  Medications  . Bevacizumab (AVASTIN) SOLN 1.25 mg       Return in about 4 weeks (around 04/11/2020) for f/u PCV OU, DFE, OCT.  There are no Patient Instructions on file for this visit.   Explained the diagnoses, plan, and follow up with the patient and they expressed understanding.  Patient expressed understanding of the importance of proper follow up care.   This document serves as a record of services personally performed by Karie Chimera, MD, PhD. It was created on their behalf by Glee Arvin. Manson Passey, OA an ophthalmic technician. The creation of this record is the provider's dictation and/or activities during the visit.    Electronically signed by: Glee Arvin. Manson Passey, [place] 12.23.2021 4:00 PM   Karie Chimera, M.D., Ph.D. Diseases & Surgery of the Retina and Vitreous [facility]  I have reviewed the above documentation for accuracy and completeness, and I agree with the above. Karie Chimera, M.D., Ph.D. 03/14/20 4:00 PM   Abbreviations: M myopia (nearsighted); A astigmatism; H hyperopia  (farsighted); P presbyopia; Mrx spectacle prescription;  CTL contact lenses; OD right eye; OS left eye; OU both eyes  XT exotropia; ET esotropia; PEK punctate epithelial keratitis; PEE punctate epithelial erosions; DES dry eye syndrome; MGD meibomian gland dysfunction; ATs artificial tears; PFAT's preservative free artificial tears; NSC nuclear sclerotic cataract; PSC posterior subcapsular cataract; ERM epi-retinal membrane; PVD posterior vitreous detachment; RD retinal detachment; DM diabetes mellitus; DR diabetic retinopathy; NPDR non-proliferative diabetic retinopathy; PDR proliferative diabetic retinopathy; CSME clinically significant macular edema; DME diabetic macular edema; dbh dot blot hemorrhages; CWS cotton wool spot; POAG primary open angle glaucoma; C/D cup-to-disc ratio; HVF humphrey visual field; GVF goldmann visual field; OCT optical coherence tomography; IOP intraocular pressure; BRVO Branch retinal vein occlusion; CRVO central retinal vein occlusion; CRAO central retinal artery occlusion; BRAO branch retinal artery occlusion; RT retinal tear; SB scleral buckle; PPV pars plana vitrectomy; VH Vitreous hemorrhage; PRP panretinal laser photocoagulation; IVK intravitreal kenalog; VMT vitreomacular traction; MH Macular hole;  NVD neovascularization of the disc; NVE neovascularization elsewhere; AREDS age related eye disease study; ARMD age related macular degeneration; POAG primary open angle glaucoma; EBMD epithelial/anterior basement membrane dystrophy; ACIOL anterior chamber intraocular lens;  IOL intraocular lens; PCIOL posterior chamber intraocular lens; Phaco/IOL phacoemulsification with intraocular lens placement; [place] photorefractive keratectomy; LASIK laser assisted in situ keratomileusis; HTN hypertension; DM diabetes mellitus; COPD chronic obstructive pulmonary disease

## 2020-03-14 ENCOUNTER — Ambulatory Visit (INDEPENDENT_AMBULATORY_CARE_PROVIDER_SITE_OTHER): Payer: Medicare Other | Admitting: Ophthalmology

## 2020-03-14 ENCOUNTER — Other Ambulatory Visit: Payer: Self-pay

## 2020-03-14 ENCOUNTER — Encounter (INDEPENDENT_AMBULATORY_CARE_PROVIDER_SITE_OTHER): Payer: Self-pay | Admitting: Ophthalmology

## 2020-03-14 DIAGNOSIS — H353112 Nonexudative age-related macular degeneration, right eye, intermediate dry stage: Secondary | ICD-10-CM

## 2020-03-14 DIAGNOSIS — H318 Other specified disorders of choroid: Secondary | ICD-10-CM | POA: Diagnosis not present

## 2020-03-14 DIAGNOSIS — H35033 Hypertensive retinopathy, bilateral: Secondary | ICD-10-CM

## 2020-03-14 DIAGNOSIS — H33102 Unspecified retinoschisis, left eye: Secondary | ICD-10-CM | POA: Diagnosis not present

## 2020-03-14 DIAGNOSIS — H353221 Exudative age-related macular degeneration, left eye, with active choroidal neovascularization: Secondary | ICD-10-CM

## 2020-03-14 DIAGNOSIS — H3581 Retinal edema: Secondary | ICD-10-CM | POA: Diagnosis not present

## 2020-03-14 DIAGNOSIS — Z961 Presence of intraocular lens: Secondary | ICD-10-CM

## 2020-03-14 DIAGNOSIS — I10 Essential (primary) hypertension: Secondary | ICD-10-CM

## 2020-03-14 MED ORDER — BEVACIZUMAB CHEMO INJECTION 1.25MG/0.05ML SYRINGE FOR KALEIDOSCOPE
1.2500 mg | INTRAVITREAL | Status: AC | PRN
  Administered 2020-03-14: 1.25 mg via INTRAVITREAL

## 2020-03-17 ENCOUNTER — Other Ambulatory Visit: Payer: Self-pay | Admitting: Cardiology

## 2020-03-17 DIAGNOSIS — I10 Essential (primary) hypertension: Secondary | ICD-10-CM

## 2020-04-10 NOTE — Progress Notes (Signed)
[facility] - Clinic Note  04/14/2020     CHIEF COMPLAINT Patient presents for Retina Follow Up   HISTORY OF PRESENT ILLNESS: Kylie Wilson is a 70 y.o. 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  This started weeks ago.  Severity is moderate.  Duration of weeks.  Since onset it is stable.  I, the attending physician,  performed the HPI with the patient and updated documentation appropriately.          Comments    Pt states her vision seems the same.  Pt denies eye pain or discomfort and denies any new or worsening floaters or fol OU.  Pt was using Rocklatan QHS OU--prescribed by Dr. Arnetha Gula, but discontinued on her own--states it makes her eyes "jump" and makes them red.       Last edited by Rennis Chris, MD on 04/14/2020  3:57 PM. (History)    pt had a routine eye exam at [facility], she states the Dr there told her she has a retinal tear in her left eye, she states she sees fol every once in awhile  Referring physician: Bunnie Pion, OD [address] 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 Rhina Brackett for DM PMH-    CURRENT MEDICATIONS: Current Outpatient Medications (Ophthalmic Drugs)  Medication Sig  . dorzolamide-timolol (COSOPT) 22.3-6.8 MG/ML ophthalmic solution Place 1 drop into both eyes 2 (two) times daily.   No current facility-administered medications for this visit. (Ophthalmic Drugs)   Current Outpatient Medications (Other)  Medication Sig  . ACCU-CHEK AVIVA PLUS test strip USE TO TEST BLOOD SUGARS DAILY.  Marland Kitchen amlodipine-olmesartan (AZOR) 10-20 MG tablet Take 1 tablet by mouth daily.  . Ascorbic Acid (VITAMIN C) 1000 MG tablet Take 1,000 mg by  mouth daily.  Marland Kitchen aspirin EC 81 MG tablet Take 81 mg by mouth daily. Swallow whole.  . Blood Glucose Monitoring Suppl (GLUCOCOM BLOOD GLUCOSE MONITOR) DEVI One Touch Verio Meter. FSBS BID. E11.319  . Cholecalciferol (VITAMIN D3 PO) Take 5,000 Units by mouth daily.  . hydrochlorothiazide (HYDRODIURIL) 25 MG tablet TAKE 1 TABLET (25 MG TOTAL) BY MOUTH IN THE MORNING.  . JARDIANCE 25 MG TABS tablet Take 25 mg by mouth daily.  Marland Kitchen LINZESS 72 MCG capsule Take 72 mcg by mouth daily as needed (abdominal cramping).   Marland Kitchen MAGNESIUM PO Take 250 mg by mouth daily.  . metFORMIN (GLUCOPHAGE) 500 MG tablet Take 1,000 mg by mouth 2 (two) times daily with a meal.   . pantoprazole (PROTONIX) 40 MG tablet Take 40 mg by mouth daily.  . rosuvastatin (CRESTOR) 10 MG tablet Take 1 tablet (10 mg total) by mouth at bedtime for 90 doses.   No current facility-administered medications for this visit. (Other)      REVIEW OF SYSTEMS: ROS    Positive for: Neurological, Endocrine, Cardiovascular, Eyes   Negative for: Constitutional, Gastrointestinal, Skin, Genitourinary, Musculoskeletal, HENT, Respiratory, Psychiatric, Allergic/Imm, Heme/Lymph   Last edited by Corrinne Eagle on 04/14/2020  3:09 PM. (History)       ALLERGIES Allergies  Allergen Reactions  . Duloxetine Other (See Comments)    Pt says it also  caused high pressure.  . Lisinopril Other (See Comments)    cough    PAST MEDICAL HISTORY Past Medical History:  Diagnosis Date  . Arthritis   . Barrett's esophagus   . Burning pain   . Cervical radiculopathy   . Cervical spondylosis   . Coronary artery calcification   . DDD (degenerative disc disease), cervical   . Diabetes mellitus    type 2   . GERD (gastroesophageal reflux disease)   . Hyperlipidemia   . Hypertension   . Obesity   . Rectocele   . Sleep apnea    does not tolerate cpap  . Uterine prolapse    Past Surgical History:  Procedure Laterality Date  . BREAST BIOPSY  1980   left   . BREAST EXCISIONAL BIOPSY Left    Bening 1980's  . SCAR REVISION  2001   both legs  . TOE SURGERY  1981   4th and 5th toe right foot  . TOE SURGERY  2002   great toe left  . TONSILLECTOMY    . TUBAL LIGATION      FAMILY HISTORY Family History  Problem Relation Age of Onset  . Heart disease Father   . Pancreatic cancer Father   . Breast cancer Paternal Aunt   . Stroke Mother   . Heart disease Brother   . Colon cancer Neg Hx   . Stomach cancer Neg Hx   . Colon polyps Neg Hx   . Rectal cancer Neg Hx     SOCIAL HISTORY Social History   Tobacco Use  . Smoking status: Former Smoker    Packs/day: 0.80    Years: 30.00    Pack years: 24.00    Types: Cigarettes    Quit date: 09/15/2011    Years since quitting: 8.5  . Smokeless tobacco: Never Used  . Tobacco comment: smoked 20 years; then quit 10; smoked 30 years plus  Vaping Use  . Vaping Use: Never used  Substance Use Topics  . Alcohol use: Yes    Comment: Every evening, 3 glasses of wine   . Drug use: No         OPHTHALMIC EXAM:  Base Eye Exam    Visual Acuity (Snellen - Linear)      Right Left   Dist [place] 20/40 -2 20/30 -1   Dist ph [place] 20/25 -1 NI   Correction: Glasses       Tonometry (Tonopen, 3:14 PM)      Right Left   Pressure 20 22       Pupils      Dark Light Shape React APD   Right 2 1 Round Minimal 0   Left 2 1 Round Minimal 0       Visual Fields      Left Right    Full Full       Extraocular Movement      Right Left    Full Full       Neuro/Psych    Oriented x3: Yes   Mood/Affect: Normal       Dilation    Both eyes: 1.0% Mydriacyl, 2.5% Phenylephrine @ 3:14 PM        Slit Lamp and Fundus Exam    Slit Lamp Exam      Right Left   Lids/Lashes Dermatochalasis - upper lid, Meibomian gland dysfunction Dermatochalasis - upper lid, Meibomian gland dysfunction   Conjunctiva/Sclera Melanosis Melanosis, trace Injection   Cornea trace Punctate epithelial erosions,  arcus trace  Punctate epithelial erosions, arcus   Anterior Chamber Deep and quiet Deep and quiet   Iris Round and moderately dilated to 5.7mm, No NVI Round and moderately dilated to 5.79mm, No NVI   Lens PC IOL in good position with open PC PC IOL in good position with open PC   Vitreous Vitreous syneresis, scattered silicone oil bubbles Vitreous syneresis, silicone oil bubbles       Fundus Exam      Right Left   Disc Trace Pallor, +cupping, Thin inferior rim mild pallor, Sharp rim, Peripapillary atrophy   C/D Ratio 0.7 0.5   Macula Flat, Blunted foveal reflex, Drusen, RPE mottling and clumping, No heme or edema Blunted foveal reflex, RPE clumping and atrophy, peripapillary SRF/edema SN macula - improving, +ERM, pigmented atrophy/scarring IN macula   Vessels attenuated, mild Copper wiring, AV crossing changes attenuated, mild tortuousity   Periphery Attached    Attached, bullous schisis cavity from 0200-0430; no RT/RD          IMAGING AND PROCEDURES  Imaging and Procedures for 04/14/2020  OCT, Retina - OU - Both Eyes       Right Eye Quality was good. Central Foveal Thickness: 218. Progression has been stable. Findings include no IRF, no SRF, retinal drusen , outer retinal atrophy, vitreomacular adhesion , normal foveal contour, intraretinal hyper-reflective material.   Left Eye Quality was good. Central Foveal Thickness: 231. Progression has improved. Findings include normal foveal contour, no IRF, retinal drusen , subretinal hyper-reflective material, subretinal fluid, pigment epithelial detachment, choroidal neovascular membrane, outer retinal atrophy, vitreomacular adhesion  (Interval improvement in peripapillary SRF overlying peripapillary PED).   Notes *Images captured and stored on drive  Diagnosis / Impression:  OD: nonexudative ARMD OS: Interval improvement in peripapillary SRF overlying peripapillary PED  Clinical management:  See below  Abbreviations: NFP - Normal foveal  profile. CME - cystoid macular edema. PED - pigment epithelial detachment. IRF - intraretinal fluid. SRF - subretinal fluid. EZ - ellipsoid zone. ERM - epiretinal membrane. ORA - outer retinal atrophy. ORT - outer retinal tubulation. SRHM - subretinal hyper-reflective material. IRHM - intraretinal hyper-reflective material        Intravitreal Injection, Pharmacologic Agent - OS - Left Eye       Time Out 04/14/2020. 3:24 PM. Confirmed correct patient, procedure, site, and patient consented.   Anesthesia Topical anesthesia was used. Anesthetic medications included Lidocaine 2%, Proparacaine 0.5%.   Procedure Preparation included eyelid speculum, 5% betadine to ocular surface. A supplied needle was used.   Injection:  1.25 mg Bevacizumab (AVASTIN) 1.25mg /0.45mL SOLN   NDC: 32122-482-50, Lot: [id number] , Expiration date: 05/21/2020   Route: Intravitreal, Site: Left Eye, Waste: 0 mL  Post-op Post injection exam found visual acuity of at least counting fingers. The patient tolerated the procedure well. There were no complications. The patient received written and verbal post procedure care education. Post injection medications were not given.                ASSESSMENT/PLAN:    ICD-10-CM   1. 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 Retinal edema  H35.81 OCT, Retina - OU - Both Eyes  6. Essential hypertension  I10   7. Hypertensive retinopathy of both eyes  H35.033   8. Pseudophakia, both eyes  Z96.1     1-3. Idiopathic polypoidal choroidal vasculopathy (PCV) OU  - Exudative age related macular degeneration, OS  - Nonexudative  ARMD OD  - was seen in 2016 here by JDM  - transferred care to [facility] w/ Dr. Dorette Grate -- received intravitreal injxns  - then transferred to Dr. Quinn Axe -- last visit 07/2019 -- IVE OD #7 for DME  - has seen Dr. Sherryll Burger twice  - s/p IVA OS #1 (11.29.21), #2 (12.31.21) here  - OCT shows persistent peripapillary CNV w/ +SRF OS (SRF improving); OD without exudative disease  - BCVA 20/25 OD, 20/30 OS  - FA (11.29.21) shows peripapillary CNV OS + peripheral leakage OU consistent w/ PCV  - recommend IVA OS #3 today, 01.31.22  - pt wishes to proceed  - RBA of procedure discussed, questions answered  - informed consent obtained and signed  - see procedure note - informed consent obtained, signed and scanned, 11.29.21 (IVA OS) - see procedure note  - f/u in 4 wks -- DFE/OCT, possible ICG angiography (transit OS)  4,5. Retinoschisis OS  - focal, bullous schisis cavity in temporal periphery 2-430  - widefield OCT confirms schisis w/o RD  - stable  - continue monitoring for now  - may need laser retinopexy if schisis progresses posteriorly  6,7. Hypertensive retinopathy OU - discussed importance of tight BP control - monitor  8. Pseudophakia OU  - s/p CE/IOL (Dr. Randon Goldsmith)  - IOL in good position, doing well - monitor   Ophthalmic Meds Ordered this visit:  Meds ordered this encounter  Medications  . Bevacizumab (AVASTIN) SOLN 1.25 mg       Return in about 4 weeks (around 05/12/2020) for f/u PCV OU, DFE, OCT.  There are no Patient Instructions on file for this visit.  This document serves as a record of services personally performed by Karie Chimera, MD, PhD. It was created on their behalf by Herby Abraham, COA, an ophthalmic technician. The creation of this record is the provider's dictation and/or activities during the visit.    Electronically signed by: Herby Abraham, COA 01.27.2022 4:04 PM   This document serves as a record of services personally performed by Karie Chimera,  MD, PhD. It was created on their behalf by Glee Arvin. Manson Passey, OA an ophthalmic technician. The creation of this record is the provider's dictation and/or activities during the visit.    Electronically signed by: Glee Arvin. Manson Passey, [place] 01.31.2022 4:04 PM  Karie Chimera, M.D., Ph.D. Diseases & Surgery of the Retina and Vitreous [facility] 04/14/2020   I have reviewed the above documentation for accuracy and completeness, and I agree with the above. Karie Chimera, M.D., Ph.D. 04/14/20 4:04 PM    Abbreviations: M myopia (nearsighted); A astigmatism; H hyperopia (farsighted); P presbyopia; Mrx spectacle prescription;  CTL contact lenses; OD right eye; OS left eye; OU both eyes  XT exotropia; ET esotropia; PEK punctate epithelial keratitis; PEE punctate epithelial erosions; DES dry eye syndrome; MGD meibomian gland dysfunction; ATs artificial tears; PFAT's preservative free artificial tears; NSC nuclear sclerotic cataract; PSC posterior subcapsular cataract; ERM epi-retinal membrane; PVD posterior vitreous detachment; RD retinal detachment; DM diabetes mellitus; DR diabetic retinopathy; NPDR non-proliferative diabetic retinopathy; PDR proliferative diabetic retinopathy; CSME clinically significant macular edema; DME diabetic macular edema; dbh dot blot hemorrhages; CWS cotton wool spot;  POAG primary open angle glaucoma; C/D cup-to-disc ratio; HVF humphrey visual field; GVF goldmann visual field; OCT optical coherence tomography; IOP intraocular pressure; BRVO Branch retinal vein occlusion; CRVO central retinal vein occlusion; CRAO central retinal artery occlusion; BRAO branch retinal artery occlusion; RT retinal tear; SB scleral buckle; PPV pars plana vitrectomy; VH Vitreous hemorrhage; PRP panretinal laser photocoagulation; IVK intravitreal kenalog; VMT vitreomacular traction; MH Macular hole;  NVD neovascularization of the disc; NVE neovascularization elsewhere; AREDS age related eye  disease study; ARMD age related macular degeneration; POAG primary open angle glaucoma; EBMD epithelial/anterior basement membrane dystrophy; ACIOL anterior chamber intraocular lens; IOL intraocular lens; PCIOL posterior chamber intraocular lens; Phaco/IOL phacoemulsification with intraocular lens placement; PRK photorefractive keratectomy; LASIK laser assisted in situ keratomileusis; HTN hypertension; DM diabetes mellitus; COPD chronic obstructive pulmonary disease

## 2020-04-14 ENCOUNTER — Ambulatory Visit (INDEPENDENT_AMBULATORY_CARE_PROVIDER_SITE_OTHER): Payer: Medicare Other | Admitting: Ophthalmology

## 2020-04-14 ENCOUNTER — Other Ambulatory Visit: Payer: Self-pay

## 2020-04-14 ENCOUNTER — Encounter (INDEPENDENT_AMBULATORY_CARE_PROVIDER_SITE_OTHER): Payer: Self-pay | Admitting: Ophthalmology

## 2020-04-14 DIAGNOSIS — Z961 Presence of intraocular lens: Secondary | ICD-10-CM

## 2020-04-14 DIAGNOSIS — H35033 Hypertensive retinopathy, bilateral: Secondary | ICD-10-CM

## 2020-04-14 DIAGNOSIS — H33102 Unspecified retinoschisis, left eye: Secondary | ICD-10-CM | POA: Diagnosis not present

## 2020-04-14 DIAGNOSIS — H318 Other specified disorders of choroid: Secondary | ICD-10-CM

## 2020-04-14 DIAGNOSIS — H353221 Exudative age-related macular degeneration, left eye, with active choroidal neovascularization: Secondary | ICD-10-CM

## 2020-04-14 DIAGNOSIS — H353112 Nonexudative age-related macular degeneration, right eye, intermediate dry stage: Secondary | ICD-10-CM | POA: Diagnosis not present

## 2020-04-14 DIAGNOSIS — I10 Essential (primary) hypertension: Secondary | ICD-10-CM

## 2020-04-14 DIAGNOSIS — H3581 Retinal edema: Secondary | ICD-10-CM

## 2020-04-14 MED ORDER — BEVACIZUMAB CHEMO INJECTION 1.25MG/0.05ML SYRINGE FOR KALEIDOSCOPE
1.2500 mg | INTRAVITREAL | Status: AC | PRN
  Administered 2020-04-14: 1.25 mg via INTRAVITREAL

## 2020-04-29 ENCOUNTER — Encounter: Payer: Self-pay | Admitting: Podiatry

## 2020-04-29 ENCOUNTER — Other Ambulatory Visit: Payer: Self-pay

## 2020-04-29 ENCOUNTER — Ambulatory Visit (INDEPENDENT_AMBULATORY_CARE_PROVIDER_SITE_OTHER): Payer: Medicare Other | Admitting: Podiatry

## 2020-04-29 DIAGNOSIS — E119 Type 2 diabetes mellitus without complications: Secondary | ICD-10-CM

## 2020-04-29 DIAGNOSIS — L84 Corns and callosities: Secondary | ICD-10-CM | POA: Diagnosis not present

## 2020-04-29 DIAGNOSIS — M79675 Pain in left toe(s): Secondary | ICD-10-CM | POA: Diagnosis not present

## 2020-04-29 DIAGNOSIS — B351 Tinea unguium: Secondary | ICD-10-CM

## 2020-04-29 DIAGNOSIS — M2041 Other hammer toe(s) (acquired), right foot: Secondary | ICD-10-CM

## 2020-04-29 DIAGNOSIS — M79674 Pain in right toe(s): Secondary | ICD-10-CM

## 2020-04-29 DIAGNOSIS — M2042 Other hammer toe(s) (acquired), left foot: Secondary | ICD-10-CM

## 2020-04-29 NOTE — Progress Notes (Signed)
  Subjective:  Patient ID: Kylie Wilson, female    DOB: 1951-02-02,  MRN: [id number]  70 y.o. female presents with preventative diabetic foot care and corn(s) b/l 5th digits and painful thick toenails that are difficult to trim. Painful toenails interfere with ambulation. Aggravating factors include wearing enclosed shoe gear. Pain is relieved with periodic professional debridement. Painful corns are aggravated when weightbearing when wearing enclosed shoe gear. Pain is relieved with periodic professional debridement..    Patient's blood sugar was 155 mg/dl this morning.  PCP: Truett Perna, MD and last visit was: 11/13/2019.  Review of Systems: Negative except as noted in the HPI.   Allergies  Allergen Reactions  . Duloxetine Other (See Comments)    Pt says it also caused high pressure.  . Lisinopril Other (See Comments)    cough    Objective:  There were no vitals filed for this visit. Constitutional Patient is a pleasant 70 y.o. African American female morbidly obese in NAD. AAO x 3.  Vascular Capillary refill time to digits immediate b/l. Palpable pedal pulses b/l LE. Pedal hair absent. Lower extremity skin temperature gradient within normal limits. No pain with calf compression b/l. No cyanosis or clubbing noted.  Neurologic Normal speech. Protective sensation intact 5/5 intact bilaterally with 10g monofilament b/l. Vibratory sensation intact b/l.  Dermatologic Pedal skin with normal turgor, texture and tone bilaterally. No open wounds bilaterally. No interdigital macerations bilaterally. Toenails 1-5 b/l elongated, discolored, dystrophic, thickened, crumbly with subungual debris and tenderness to dorsal palpation. Hyperkeratotic lesion(s) L 5th toe and R 5th toe.  No erythema, no edema, no drainage, no fluctuance.  Orthopedic: Normal muscle strength 5/5 to all lower extremity muscle groups bilaterally. No pain crepitus or joint limitation noted with ROM b/l. Hammertoes noted to the  L 5th toe and R 5th toe. Pes planus deformity noted b/l.     Assessment:   1. Pain due to onychomycosis of toenails of both feet   2. Corns   3. Acquired hammertoes of both feet   4. Controlled type 2 diabetes mellitus without complication, without long-term current use of insulin (HCC)    Plan:  Patient was evaluated and treated and all questions answered.  Onychomycosis with pain -Nails palliatively debridement as below. -Educated on self-care  Procedure: Nail Debridement Rationale: Pain Type of Debridement: manual, sharp debridement. Instrumentation: Nail nipper, rotary burr. Number of Nails: 10  -Examined patient. -No new findings. No new orders. -Continue diabetic foot care principles. -Patient to continue soft, supportive shoe gear daily. -Toenails 1-5 b/l were debrided in length and girth with sterile nail nippers and dremel without iatrogenic bleeding.  -Corn(s) L 5th toe and R 5th toe pared utilizing sterile scalpel blade without complication or incident. Total number debrided=2. -Patient to report any pedal injuries to medical professional immediately. -Patient/POA to call should there be question/concern in the interim.  Return in about 3 months (around 07/27/2020).  Freddie Breech, DPM

## 2020-05-12 ENCOUNTER — Other Ambulatory Visit: Payer: Self-pay

## 2020-05-12 ENCOUNTER — Encounter (INDEPENDENT_AMBULATORY_CARE_PROVIDER_SITE_OTHER): Payer: Self-pay | Admitting: Ophthalmology

## 2020-05-12 ENCOUNTER — Ambulatory Visit (INDEPENDENT_AMBULATORY_CARE_PROVIDER_SITE_OTHER): Payer: Medicare Other | Admitting: Ophthalmology

## 2020-05-12 DIAGNOSIS — H353112 Nonexudative age-related macular degeneration, right eye, intermediate dry stage: Secondary | ICD-10-CM | POA: Diagnosis not present

## 2020-05-12 DIAGNOSIS — H318 Other specified disorders of choroid: Secondary | ICD-10-CM

## 2020-05-12 DIAGNOSIS — H353221 Exudative age-related macular degeneration, left eye, with active choroidal neovascularization: Secondary | ICD-10-CM | POA: Diagnosis not present

## 2020-05-12 DIAGNOSIS — H33102 Unspecified retinoschisis, left eye: Secondary | ICD-10-CM

## 2020-05-12 DIAGNOSIS — H3581 Retinal edema: Secondary | ICD-10-CM

## 2020-05-12 DIAGNOSIS — I10 Essential (primary) hypertension: Secondary | ICD-10-CM

## 2020-05-12 DIAGNOSIS — Z961 Presence of intraocular lens: Secondary | ICD-10-CM

## 2020-05-12 DIAGNOSIS — H35033 Hypertensive retinopathy, bilateral: Secondary | ICD-10-CM

## 2020-05-12 MED ORDER — BEVACIZUMAB CHEMO INJECTION 1.25MG/0.05ML SYRINGE FOR KALEIDOSCOPE
1.2500 mg | INTRAVITREAL | Status: AC | PRN
  Administered 2020-05-12: 1.25 mg via INTRAVITREAL

## 2020-05-12 NOTE — Progress Notes (Signed)
[facility] - Clinic Note  05/12/2020     CHIEF COMPLAINT Patient presents for Retina Follow Up   HISTORY OF PRESENT ILLNESS: Kylie Wilson is a 70 y.o. 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  This started 4 weeks ago.  I, the attending physician,  performed the HPI with the patient and updated documentation appropriately.          Comments    Patient here for 4 weeks retina follow up for exu ARMD OS. Patient states vision about the same. OS sees more light flashes. No eye pain. Runs a lot to water and itches. Uses both drops- if don't use then pressure too high.       Last edited by Rennis Chris, MD on 05/12/2020 12:53 PM. (History)    pt states vision is "no worse" today  Referring physician: Truett Perna, MD [id number] [place],  [place] [address]  HISTORICAL INFORMATION:   Selected notes from the MEDICAL RECORD NUMBER Previous Kylie Wilson pt (2016) self-referred for possible RD LEE:  Ocular Hx- PCV diagnosed by Dr. Dorette Grate at [facility]; followed w/ Dr. Rhina Brackett for DM PMH-    CURRENT MEDICATIONS: Current Outpatient Medications (Ophthalmic Drugs)  Medication Sig  . dorzolamide-timolol (COSOPT) 22.3-6.8 MG/ML ophthalmic solution Place 1 drop into both eyes 2 (two) times daily.  Marland Kitchen ROCKLATAN 0.02-0.005 % SOLN Apply to eye.   No current facility-administered medications for this visit. (Ophthalmic Drugs)   Current Outpatient Medications (Other)  Medication Sig  . ACCU-CHEK AVIVA PLUS test strip USE TO TEST BLOOD SUGARS DAILY.  Marland Kitchen amlodipine-olmesartan (AZOR) 10-20 MG tablet Take 1 tablet by mouth daily.  . Ascorbic Acid (VITAMIN C) 1000 MG tablet Take 1,000 mg by mouth daily.  Marland Kitchen aspirin EC 81 MG tablet Take 81 mg by mouth daily. Swallow whole.  . Blood Glucose Monitoring Suppl (GLUCOCOM BLOOD GLUCOSE MONITOR) DEVI One Touch Verio Meter. FSBS BID.  E11.319  . Cholecalciferol (VITAMIN D3 PO) Take 5,000 Units by mouth daily.  . empagliflozin (JARDIANCE) 10 MG TABS tablet   . hydrochlorothiazide (HYDRODIURIL) 12.5 MG tablet   . Lancets (ONETOUCH DELICA PLUS LANCET33G) MISC Apply topically.  Marland Kitchen LINZESS 72 MCG capsule Take 72 mcg by mouth daily as needed (abdominal cramping).   Marland Kitchen MAGNESIUM PO Take 250 mg by mouth daily.  . metFORMIN (GLUCOPHAGE) 500 MG tablet Take 1,000 mg by mouth 2 (two) times daily with a meal.   . pantoprazole (PROTONIX) 40 MG tablet Take 40 mg by mouth daily.  . rosuvastatin (CRESTOR) 10 MG tablet Take 1 tablet (10 mg total) by mouth at bedtime for 90 doses.   No current facility-administered medications for this visit. (Other)      REVIEW OF SYSTEMS: ROS    Positive for: Neurological, Endocrine, Cardiovascular, Eyes   Negative for: Constitutional, Gastrointestinal, Skin, Genitourinary, Musculoskeletal, HENT, Respiratory, Psychiatric, Allergic/Imm, Heme/Lymph   Last edited by Laddie Aquas, COA on 05/12/2020  9:45 AM. (History)       ALLERGIES Allergies  Allergen Reactions  . Duloxetine Other (See Comments)    Pt says it also caused high pressure.  . Lisinopril Other (See Comments)    cough    PAST MEDICAL HISTORY Past Medical History:  Diagnosis Date  . Arthritis   . Barrett's esophagus   . Burning pain   .  Cervical radiculopathy   . Cervical spondylosis   . Coronary artery calcification   . DDD (degenerative disc disease), cervical   . Diabetes mellitus    type 2   . GERD (gastroesophageal reflux disease)   . Hyperlipidemia   . Hypertension   . Obesity   . Rectocele   . Sleep apnea    does not tolerate cpap  . Uterine prolapse    Past Surgical History:  Procedure Laterality Date  . BREAST BIOPSY  1980   left  . BREAST EXCISIONAL BIOPSY Left    Bening 1980's  . SCAR REVISION  2001   both legs  . TOE SURGERY  1981   4th and 5th toe right foot  . TOE SURGERY  2002   great toe  left  . TONSILLECTOMY    . TUBAL LIGATION      FAMILY HISTORY Family History  Problem Relation Age of Onset  . Heart disease Father   . Pancreatic cancer Father   . Breast cancer Paternal Aunt   . Stroke Mother   . Heart disease Brother   . Colon cancer Neg Hx   . Stomach cancer Neg Hx   . Colon polyps Neg Hx   . Rectal cancer Neg Hx     SOCIAL HISTORY Social History   Tobacco Use  . Smoking status: Former Smoker    Packs/day: 0.80    Years: 30.00    Pack years: 24.00    Types: Cigarettes    Quit date: 09/15/2011    Years since quitting: 8.6  . Smokeless tobacco: Never Used  . Tobacco comment: smoked 20 years; then quit 10; smoked 30 years plus  Vaping Use  . Vaping Use: Never used  Substance Use Topics  . Alcohol use: Yes    Comment: Every evening, 3 glasses of wine   . Drug use: No         OPHTHALMIC EXAM:  Base Eye Exam    Visual Acuity (Snellen - Linear)      Right Left   Dist cc 20/25 20/25   Dist ph cc 20/20 -1 NI   Correction: Glasses       Tonometry (Tonopen, 9:40 AM)      Right Left   Pressure 16 18       Pupils      Dark Light Shape React APD   Right 2 1 Round Minimal None   Left 2 1 Round Minimal None       Visual Fields (Counting fingers)      Left Right    Full Full       Extraocular Movement      Right Left    Full Full       Neuro/Psych    Oriented x3: Yes   Mood/Affect: Normal       Dilation    Both eyes: 1.0% Mydriacyl, 2.5% Phenylephrine @ 9:40 AM        Slit Lamp and Fundus Exam    Slit Lamp Exam      Right Left   Lids/Lashes Dermatochalasis - upper lid, Meibomian gland dysfunction Dermatochalasis - upper lid, Meibomian gland dysfunction   Conjunctiva/Sclera Melanosis Melanosis, trace Injection   Cornea trace Punctate epithelial erosions, arcus trace Punctate epithelial erosions, arcus   Anterior Chamber Deep and quiet Deep and quiet   Iris Round and moderately dilated to 5.86mm, No NVI Round and moderately  dilated to 5.42mm, No NVI   Lens PC IOL  in good position with open PC PC IOL in good position with open PC   Vitreous Vitreous syneresis, scattered silicone oil bubbles Vitreous syneresis, silicone oil bubbles       Fundus Exam      Right Left   Disc Trace Pallor, +cupping, Thin inferior rim mild pallor, Sharp rim, Peripapillary atrophy   C/D Ratio 0.7 0.5   Macula Flat, Blunted foveal reflex, Drusen, RPE mottling and clumping, No heme or edema good foveal reflex, RPE clumping and atrophy, peripapillary SRF/edema SN macula - persistent, +ERM, pigmented atrophy/scarring IN macula   Vessels attenuated, mild Copper wiring, AV crossing changes attenuated, mild tortuousity   Periphery Attached    Attached, bullous schisis cavity from 0200-0430 -- stable; no RT/RD        Refraction    Wearing Rx      Sphere Cylinder Axis Add   Right -0.75 +2.00 163 +1.75   Left +0.00 +1.25 169 +1.75          IMAGING AND PROCEDURES  Imaging and Procedures for 05/12/2020  OCT, Retina - OU - Both Eyes       Right Eye Quality was good. Central Foveal Thickness: 217. Progression has been stable. Findings include no IRF, no SRF, retinal drusen , outer retinal atrophy, vitreomacular adhesion , normal foveal contour, intraretinal hyper-reflective material.   Left Eye Quality was good. Central Foveal Thickness: 228. Progression has been stable. Findings include normal foveal contour, no IRF, retinal drusen , subretinal hyper-reflective material, subretinal fluid, pigment epithelial detachment, choroidal neovascular membrane, outer retinal atrophy, vitreomacular adhesion  (persistent SRF superior to disc -- slightly increased).   Notes *Images captured and stored on drive  Diagnosis / Impression:  OD: nonexudative ARMD OS: persistent SRF superior to disc -- slightly increased  Clinical management:  See below  Abbreviations: NFP - Normal foveal profile. CME - cystoid macular edema. PED - pigment  epithelial detachment. IRF - intraretinal fluid. SRF - subretinal fluid. EZ - ellipsoid zone. ERM - epiretinal membrane. ORA - outer retinal atrophy. ORT - outer retinal tubulation. SRHM - subretinal hyper-reflective material. IRHM - intraretinal hyper-reflective material        Intravitreal Injection, Pharmacologic Agent - OS - Left Eye       Time Out 05/12/2020. 10:28 AM. Confirmed correct patient, procedure, site, and patient consented.   Anesthesia Topical anesthesia was used. Anesthetic medications included Lidocaine 2%, Proparacaine 0.5%.   Procedure Preparation included eyelid speculum, 5% betadine to ocular surface. A supplied needle was used.   Injection:  1.25 mg Bevacizumab (AVASTIN) 1.25mg /0.13mL SOLN   NDC: 62563-893-73, Lot: [id number], Expiration date: 06/19/2020   Route: Intravitreal, Site: Left Eye, Waste: 0.05 mL  Post-op Post injection exam found visual acuity of at least counting fingers. The patient tolerated the procedure well. There were no complications. The patient received written and verbal post procedure care education. Post injection medications were not given.        ICG Angiography Optos (Transit OS)       Right Eye Progression has no prior data. Early phase findings include normal observations. Mid/Late phase findings include normal observations (No polyps).   Left Eye Progression has no prior data. Early phase findings include blockage, window defect, leakage (Focal hyper cyanscence superior to disc -- cluster of polyps ). Mid/Late phase findings include blockage, leakage (Focal hyper cyancescence temporal periphery within schisis cavity, persistent blockage inferior macula and peripapillary).   Notes **Images stored on drive**  Impression: OD: normal -- polyps  OS: polypoidal choroidal vasculopathy (PCV); Focal hyper-cyanescence superior to disc -- cluster of polyps                  ASSESSMENT/PLAN:    ICD-10-CM   1. 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 Retinal edema  H35.81 OCT, Retina - OU - Both Eyes  6. Essential hypertension  I10   7. Hypertensive retinopathy of both eyes  H35.033 ICG Angiography Optos (Transit OS)  8. Pseudophakia, both eyes  Z96.1     1-3. Idiopathic polypoidal choroidal vasculopathy (PCV) OU  - Exudative age related macular degeneration, OS  - Nonexudative ARMD OD  - was seen in 2016 here by JDM  - transferred care to [facility] w/ Dr. Dorette Grate -- received intravitreal injxns  - then transferred to Dr. Quinn Axe -- last visit 07/2019 -- IVE OD #7 for DME  - has seen Dr. Sherryll Burger twice  - now here, s/p IVA OS #1 (11.29.21), #2 (12.31.21), #3 (01.31.22)  - OCT shows persistent peripapillary CNV w/ +SRF OS (SRF persistent); OD without exudative disease  - BCVA 20/20 OD, 20/25 OS (both improved)  - FA (11.29.21) shows peripapillary CNV OS + peripheral leakage OU consistent w/ PCV  - ICG (02.28.22) shows polypoidal choroidal vasculopathy (PCV); Focal hyper cyanscence superior to disc -- cluster of polyps   - recommend IVA OS #4 today, 02.28.22  - pt wishes to proceed  - RBA of procedure discussed, questions answered  - informed consent obtained and signed  - see procedure note - informed consent obtained, signed and scanned, 11.29.21 (IVA OS) - see procedure note  - f/u in 4 wks -- DFE/OCT  4,5. Retinoschisis OS  - focal, bullous schisis cavity in temporal periphery 2-430  - widefield OCT confirms schisis w/o RD  - stable  - continue monitoring for now  - may need laser retinopexy if schisis progresses posteriorly  6,7. Hypertensive  retinopathy OU - discussed importance of tight BP control - monitor  8. Pseudophakia OU  - s/p CE/IOL (Dr. Randon Goldsmith)  - IOL in good position, doing well - monitor   Ophthalmic Meds Ordered this visit:  Meds ordered this encounter  Medications  . Bevacizumab (AVASTIN) SOLN 1.25 mg       Return in about 4 weeks (around 06/09/2020) for f/u PCV OU, DFE, OCT.  There are no Patient Instructions on file for this visit.  This document serves as a record of services personally performed by Karie Chimera, MD, PhD. It was created on their behalf by Glee Arvin. Manson Passey, OA an ophthalmic technician. The creation of this record is the provider's dictation and/or activities during the visit.    Electronically signed by: Glee Arvin. Kristopher Oppenheim 02.28.2022 12:57 PM  Karie Chimera, M.D., Ph.D. Diseases & Surgery of the Retina and Vitreous [facility]  I have reviewed the above documentation for accuracy and completeness, and I agree with the above. Karie Chimera, M.D., Ph.D. 05/12/20 12:57 PM   Abbreviations: M myopia (nearsighted); A astigmatism; H hyperopia (farsighted); P presbyopia; Mrx spectacle prescription;  CTL contact lenses; OD right eye; OS left eye; OU both eyes  XT exotropia; ET esotropia; PEK punctate epithelial keratitis; PEE punctate epithelial erosions; DES dry eye syndrome; MGD meibomian gland dysfunction; ATs artificial tears; PFAT's preservative free artificial tears; NSC nuclear sclerotic cataract; PSC posterior subcapsular cataract; ERM epi-retinal membrane; PVD posterior vitreous detachment; RD retinal detachment; DM diabetes mellitus; DR diabetic retinopathy; NPDR non-proliferative diabetic retinopathy; PDR proliferative diabetic retinopathy; CSME clinically significant macular edema; DME diabetic macular edema; dbh dot blot hemorrhages; CWS cotton wool spot; POAG primary open angle glaucoma; C/D cup-to-disc ratio; HVF humphrey visual field; GVF goldmann  visual field; OCT optical coherence tomography; IOP intraocular pressure; BRVO Branch retinal vein occlusion; CRVO central retinal vein occlusion; CRAO central retinal artery occlusion; BRAO branch retinal artery occlusion; RT retinal tear; SB scleral buckle; PPV pars plana vitrectomy; VH Vitreous hemorrhage; PRP panretinal laser photocoagulation; IVK intravitreal kenalog; VMT vitreomacular traction; MH Macular hole;  NVD neovascularization of the disc; NVE neovascularization elsewhere; AREDS age related eye disease study; ARMD age related macular degeneration; POAG primary open angle glaucoma; EBMD epithelial/anterior basement membrane dystrophy; ACIOL anterior chamber intraocular lens; IOL intraocular lens; PCIOL posterior chamber intraocular lens; Phaco/IOL phacoemulsification with intraocular lens placement; PRK photorefractive keratectomy; LASIK laser assisted in situ keratomileusis; HTN hypertension; DM diabetes mellitus; COPD chronic obstructive pulmonary disease

## 2020-06-10 NOTE — Progress Notes (Signed)
[facility] - Clinic Note  06/11/2020     CHIEF COMPLAINT Patient presents for Retina Follow Up   HISTORY OF PRESENT ILLNESS: Kylie Wilson is a 70 y.o. female who presents to the clinic today for:   HPI    Retina Follow Up    Patient presents with  Other.  In both eyes.  This started 4 weeks ago.  I, the attending physician,  performed the HPI with the patient and updated documentation appropriately.          Comments    Patient here for 4 weeks retina follow up for PCV OU. Patient states vision about the same as last visit or worse. Sees more light. The floaters used to be small, now long. Not often has eye pain.        Last edited by Rennis Chris, MD on 06/11/2020  1:14 PM. (History)    pt states her eyes are red today due to using Rocklatan, she states she had stopped using it due to the redness, but that makes her pressure increase, she has an appt with Dr. Dione Booze in May, she states her eyes water a lot  Referring physician: Truett Perna, MD [id number] [place],  [place] [address]  HISTORICAL INFORMATION:   Selected notes from the MEDICAL RECORD NUMBER Previous [name] pt (2016) self-referred for possible RD LEE:  Ocular Hx- PCV diagnosed by Dr. Dorette Grate at [facility]; followed w/ Dr. Rhina Brackett for DM PMH-    CURRENT MEDICATIONS: Current Outpatient Medications (Ophthalmic Drugs)  Medication Sig  . dorzolamide-timolol (COSOPT) 22.3-6.8 MG/ML ophthalmic solution Place 1 drop into both eyes 2 (two) times daily.  Marland Kitchen ROCKLATAN 0.02-0.005 % SOLN Apply to eye.   No current facility-administered medications for this visit. (Ophthalmic Drugs)   Current Outpatient Medications (Other)  Medication Sig  . ACCU-CHEK AVIVA PLUS test strip USE TO TEST BLOOD SUGARS DAILY.  Marland Kitchen amlodipine-olmesartan (AZOR) 10-20 MG tablet Take 1 tablet by mouth daily.  . Ascorbic Acid (VITAMIN C) 1000 MG tablet Take 1,000 mg by mouth daily.  Marland Kitchen aspirin EC 81 MG  tablet Take 81 mg by mouth daily. Swallow whole.  . Blood Glucose Monitoring Suppl (GLUCOCOM BLOOD GLUCOSE MONITOR) DEVI One Touch Verio Meter. FSBS BID. E11.319  . Cholecalciferol (VITAMIN D3 PO) Take 5,000 Units by mouth daily.  . empagliflozin (JARDIANCE) 10 MG TABS tablet   . hydrochlorothiazide (HYDRODIURIL) 12.5 MG tablet   . Lancets (ONETOUCH DELICA PLUS LANCET33G) MISC Apply topically.  Marland Kitchen LINZESS 72 MCG capsule Take 72 mcg by mouth daily as needed (abdominal cramping).   Marland Kitchen MAGNESIUM PO Take 250 mg by mouth daily.  . metFORMIN (GLUCOPHAGE) 500 MG tablet Take 1,000 mg by mouth 2 (two) times daily with a meal.   . pantoprazole (PROTONIX) 40 MG tablet Take 40 mg by mouth daily.  . rosuvastatin (CRESTOR) 10 MG tablet Take 1 tablet (10 mg total) by mouth at bedtime for 90 doses.   No current facility-administered medications for this visit. (Other)      REVIEW OF SYSTEMS: ROS    Positive for: Neurological, Endocrine, Cardiovascular, Eyes   Negative for: Constitutional, Gastrointestinal, Skin, Genitourinary, Musculoskeletal, HENT, Respiratory, Psychiatric, Allergic/Imm, Heme/Lymph   Last edited by Laddie Aquas, COA on 06/11/2020  9:36 AM. (History)       ALLERGIES Allergies  Allergen Reactions  . Duloxetine Other (See Comments)    Pt says it also caused high pressure.  . Lisinopril Other (  See Comments)    cough    PAST MEDICAL HISTORY Past Medical History:  Diagnosis Date  . Arthritis   . Barrett's esophagus   . Burning pain   . Cervical radiculopathy   . Cervical spondylosis   . Coronary artery calcification   . DDD (degenerative disc disease), cervical   . Diabetes mellitus    type 2   . GERD (gastroesophageal reflux disease)   . Hyperlipidemia   . Hypertension   . Obesity   . Rectocele   . Sleep apnea    does not tolerate cpap  . Uterine prolapse    Past Surgical History:  Procedure Laterality Date  . BREAST BIOPSY  1980   left  . BREAST EXCISIONAL  BIOPSY Left    Bening 1980's  . SCAR REVISION  2001   both legs  . TOE SURGERY  1981   4th and 5th toe right foot  . TOE SURGERY  2002   great toe left  . TONSILLECTOMY    . TUBAL LIGATION      FAMILY HISTORY Family History  Problem Relation Age of Onset  . Heart disease Father   . Pancreatic cancer Father   . Breast cancer Paternal Aunt   . Stroke Mother   . Heart disease Brother   . Colon cancer Neg Hx   . Stomach cancer Neg Hx   . Colon polyps Neg Hx   . Rectal cancer Neg Hx     SOCIAL HISTORY Social History   Tobacco Use  . Smoking status: Former Smoker    Packs/day: 0.80    Years: 30.00    Pack years: 24.00    Types: Cigarettes    Quit date: 09/15/2011    Years since quitting: 8.7  . Smokeless tobacco: Never Used  . Tobacco comment: smoked 20 years; then quit 10; smoked 30 years plus  Vaping Use  . Vaping Use: Never used  Substance Use Topics  . Alcohol use: Yes    Comment: Every evening, 3 glasses of wine   . Drug use: No         OPHTHALMIC EXAM:  Base Eye Exam    Visual Acuity (Snellen - Linear)      Right Left   Dist cc 20/25 20/25 -1   Dist ph cc NI NI   Correction: Glasses       Tonometry (Tonopen, 9:30 AM)      Right Left   Pressure 16 18       Pupils      Dark Light Shape React APD   Right 2 1 Round Minimal None   Left 2 1 Round Minimal None       Visual Fields (Counting fingers)      Left Right    Full Full       Extraocular Movement      Right Left    Full Full       Neuro/Psych    Oriented x3: Yes   Mood/Affect: Normal       Dilation    Both eyes: 1.0% Mydriacyl, 2.5% Phenylephrine @ 9:30 AM        Slit Lamp and Fundus Exam    Slit Lamp Exam      Right Left   Lids/Lashes Dermatochalasis - upper lid, Meibomian gland dysfunction Dermatochalasis - upper lid, Meibomian gland dysfunction   Conjunctiva/Sclera Melanosis, 1+ Injection Melanosis, 1+ Injection   Cornea trace Punctate epithelial erosions, arcus, high  tear lake  1+Punctate epithelial erosions, arcus, high tear lake   Anterior Chamber Deep and quiet Deep and quiet   Iris Round and moderately dilated, No NVI Round and moderately dilated to 5.555mm, No NVI   Lens PC IOL in good position with open PC PC IOL in good position with open PC   Vitreous Vitreous syneresis, scattered silicone oil bubbles Vitreous syneresis, silicone oil bubbles       Fundus Exam      Right Left   Disc Trace Pallor, +cupping, Thin inferior rim mild pallor, Sharp rim, Peripapillary atrophy, Thin inferior rim   C/D Ratio 0.7 0.6   Macula Flat, Blunted foveal reflex, Drusen, RPE mottling and clumping, No heme or edema good foveal reflex, RPE clumping and atrophy, peripapillary SRF/edema SN macula - persistent/slighlty improved, +ERM, pigmented atrophy/scarring IN macula   Vessels attenuated, Tortuous attenuated, mild tortuousity   Periphery Attached    Attached, bullous schisis cavity from 0200-0430 -- stable; no RT/RD        Refraction    Wearing Rx      Sphere Cylinder Axis Add   Right -0.75 +2.00 163 +1.75   Left +0.00 +1.25 169 +1.75          IMAGING AND PROCEDURES  Imaging and Procedures for 06/11/2020  Intravitreal Injection, Pharmacologic Agent - OS - Left Eye       Time Out 06/11/2020. 9:48 AM. Confirmed correct patient, procedure, site, and patient consented.   Anesthesia Topical anesthesia was used. Anesthetic medications included Lidocaine 2%, Proparacaine 0.5%.   Procedure Preparation included eyelid speculum, 5% betadine to ocular surface. A supplied needle was used.   Injection:  1.25 mg Bevacizumab (AVASTIN) 1.25mg /0.10505mL SOLN   NDC: P321340550242-060-01, Lot: [id number], Expiration date: 07/19/2020   Route: Intravitreal, Site: Left Eye, Waste: 0 mL  Post-op Post injection exam found visual acuity of at least counting fingers. The patient tolerated the procedure well. There were no complications. The patient received written and verbal post  procedure care education. Post injection medications were not given.        OCT, Retina - OU - Both Eyes       Right Eye Quality was good. Central Foveal Thickness: 218. Progression has been stable. Findings include no IRF, no SRF, retinal drusen , outer retinal atrophy, vitreomacular adhesion , normal foveal contour, intraretinal hyper-reflective material.   Left Eye Quality was good. Central Foveal Thickness: 229. Progression has improved. Findings include normal foveal contour, no IRF, retinal drusen , subretinal hyper-reflective material, subretinal fluid, pigment epithelial detachment, choroidal neovascular membrane, outer retinal atrophy, vitreomacular adhesion  (Interval improvement in peripapillary SRF superior to disc, persistent PED).   Notes *Images captured and stored on drive  Diagnosis / Impression:  OD: nonexudative ARMD OS: Interval improvement in peripapillary SRF superior to disc, persistent PED  Clinical management:  See below  Abbreviations: NFP - Normal foveal profile. CME - cystoid macular edema. PED - pigment epithelial detachment. IRF - intraretinal fluid. SRF - subretinal fluid. EZ - ellipsoid zone. ERM - epiretinal membrane. ORA - outer retinal atrophy. ORT - outer retinal tubulation. SRHM - subretinal hyper-reflective material. IRHM - intraretinal hyper-reflective material                ASSESSMENT/PLAN:    ICD-10-CM   1. 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 Retinal edema  H35.81 OCT, Retina - OU - Both Eyes  6. Essential hypertension  I10   7. Hypertensive retinopathy of both eyes  H35.033    8. Pseudophakia, both eyes  Z96.1     1-3. Idiopathic polypoidal choroidal vasculopathy (PCV) OU  - Exudative age related macular degeneration, OS  - Nonexudative ARMD OD  - was seen in 2016 here by JDM  - transferred care to [facility] w/ Dr. Dorette Grate -- received intravitreal injxns  - then transferred to Dr. Quinn Axe -- last visit 07/2019 -- IVE OD #7 for DME  - has seen Dr. Sherryll Burger twice  - now here, s/p IVA OS #1 (11.29.21), #2 (12.31.21), #3 (01.31.22), #4 (02.28.22)  - OCT shows persistent peripapillary CNV w/ +SRF OS (SRF slightly improved); OD without exudative disease  - BCVA 20/25 OU (stable)  - FA (11.29.21) shows peripapillary CNV OS + peripheral leakage OU consistent w/ PCV  - ICG (02.28.22) shows polypoidal choroidal vasculopathy (PCV); Focal hyper cyanscence superior to disc -- cluster of polyps   - discussed possibility of partial IVA resistance and other medications  - recommend IVA OS #5 today, 03.30.22  - pt wishes to proceed  - RBA of procedure discussed, questions answered  - informed consent obtained and signed  - see procedure note - informed consent obtained, signed and scanned, 11.29.21 (IVA OS) - see procedure note  - f/u in 4 wks -- DFE/OCT  4,5. Retinoschisis OS  - focal, bullous schisis cavity in temporal periphery 2-430  - widefield OCT confirms schisis w/o RD  - stable  - continue monitoring for now  - may need laser if retinoschisis progresses posteriorly  6,7. Hypertensive retinopathy OU - discussed importance of tight BP control - monitor  8. Pseudophakia OU  - s/p CE/IOL (Dr. Randon Goldsmith)  - IOL in good position, doing well - monitor   Ophthalmic Meds Ordered this visit:  Meds ordered this encounter  Medications  . Bevacizumab (AVASTIN) SOLN 1.25 mg       Return in about 4 weeks (around 07/09/2020) for f/u PCV OU, DFE, OCT.  There are no Patient Instructions on file for this visit.  This document serves as a record of services personally  performed by Karie Chimera, MD, PhD. It was created on their behalf by Annalee Genta, COMT. The creation of this record is the provider's dictation and/or activities during the visit.  Electronically signed by: Annalee Genta, COMT 06/11/20 1:18 PM  This document serves as a record of services personally performed by Karie Chimera, MD, PhD. It was created on their behalf by Glee Arvin. Manson Passey, OA an ophthalmic technician. The creation of this record is the provider's dictation and/or activities during the visit.    Electronically signed by: Glee Arvin. [place], [place] 03.30.2022 1:18 PM   Karie Chimera, M.D., Ph.D. Diseases & Surgery of the Retina and Vitreous [facility]  I have reviewed the above documentation for accuracy and completeness, and I agree with the above. Karie Chimera, M.D., Ph.D. 06/11/20 1:18 PM   Abbreviations: M myopia (nearsighted); A astigmatism; H hyperopia (farsighted); P presbyopia; Mrx spectacle prescription;  CTL contact lenses; OD right eye; OS left eye; OU both eyes  XT exotropia; ET esotropia; PEK punctate epithelial keratitis; PEE punctate epithelial erosions; DES dry eye syndrome;  MGD meibomian gland dysfunction; ATs artificial tears; PFAT's preservative free artificial tears; NSC nuclear sclerotic cataract; PSC posterior subcapsular cataract; ERM epi-retinal membrane; PVD posterior vitreous detachment; RD retinal detachment; DM diabetes mellitus; DR diabetic retinopathy; NPDR non-proliferative diabetic retinopathy; PDR proliferative diabetic retinopathy; CSME clinically significant macular edema; DME diabetic macular edema; dbh dot blot hemorrhages; CWS cotton wool spot; POAG primary open angle glaucoma; C/D cup-to-disc ratio; HVF humphrey visual field; GVF goldmann visual field; OCT optical coherence tomography; IOP intraocular pressure; BRVO Branch retinal vein occlusion; CRVO central retinal vein occlusion; CRAO central retinal artery occlusion;  BRAO branch retinal artery occlusion; RT retinal tear; SB scleral buckle; PPV pars plana vitrectomy; VH Vitreous hemorrhage; PRP panretinal laser photocoagulation; IVK intravitreal kenalog; VMT vitreomacular traction; MH Macular hole;  NVD neovascularization of the disc; NVE neovascularization elsewhere; AREDS age related eye disease study; ARMD age related macular degeneration; POAG primary open angle glaucoma; EBMD epithelial/anterior basement membrane dystrophy; ACIOL anterior chamber intraocular lens; IOL intraocular lens; PCIOL posterior chamber intraocular lens; Phaco/IOL phacoemulsification with intraocular lens placement; PRK photorefractive keratectomy; LASIK laser assisted in situ keratomileusis; HTN hypertension; DM diabetes mellitus; COPD chronic obstructive pulmonary disease

## 2020-06-11 ENCOUNTER — Ambulatory Visit (INDEPENDENT_AMBULATORY_CARE_PROVIDER_SITE_OTHER): Payer: Medicare Other | Admitting: Ophthalmology

## 2020-06-11 ENCOUNTER — Other Ambulatory Visit: Payer: Self-pay

## 2020-06-11 ENCOUNTER — Encounter (INDEPENDENT_AMBULATORY_CARE_PROVIDER_SITE_OTHER): Payer: Self-pay | Admitting: Ophthalmology

## 2020-06-11 DIAGNOSIS — H33102 Unspecified retinoschisis, left eye: Secondary | ICD-10-CM

## 2020-06-11 DIAGNOSIS — H318 Other specified disorders of choroid: Secondary | ICD-10-CM

## 2020-06-11 DIAGNOSIS — Z961 Presence of intraocular lens: Secondary | ICD-10-CM

## 2020-06-11 DIAGNOSIS — H353221 Exudative age-related macular degeneration, left eye, with active choroidal neovascularization: Secondary | ICD-10-CM

## 2020-06-11 DIAGNOSIS — H35033 Hypertensive retinopathy, bilateral: Secondary | ICD-10-CM

## 2020-06-11 DIAGNOSIS — I10 Essential (primary) hypertension: Secondary | ICD-10-CM

## 2020-06-11 DIAGNOSIS — H3581 Retinal edema: Secondary | ICD-10-CM

## 2020-06-11 DIAGNOSIS — H353112 Nonexudative age-related macular degeneration, right eye, intermediate dry stage: Secondary | ICD-10-CM | POA: Diagnosis not present

## 2020-06-11 MED ORDER — BEVACIZUMAB CHEMO INJECTION 1.25MG/0.05ML SYRINGE FOR KALEIDOSCOPE
1.2500 mg | INTRAVITREAL | Status: AC | PRN
  Administered 2020-06-11: 1.25 mg via INTRAVITREAL

## 2020-07-08 NOTE — Progress Notes (Signed)
[facility] - Clinic Note  07/09/2020     CHIEF COMPLAINT Patient presents for Retina Follow Up   HISTORY OF PRESENT ILLNESS: Kylie Wilson is a 70 y.o. 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  This started months ago.  Severity is moderate.  Duration of 4 weeks.  Since onset it is stable.  I, the attending physician,  performed the HPI with the patient and updated documentation appropriately.          Comments    70 y/o female pt here for 4 wk f/u for exu ARMD OS.  No change in [place] OU.  Denies pain, FOL, floaters.  Rocklatan QHS OU.  Dorzolamide BID OU.       Last edited by Rennis Chris, MD on 07/10/2020  8:41 PM. 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 Rhina Brackett for DM PMH-    CURRENT MEDICATIONS: Current Outpatient Medications (Ophthalmic Drugs)  Medication Sig  . dorzolamide-timolol (COSOPT) 22.3-6.8 MG/ML ophthalmic solution Place 1 drop into both eyes 2 (two) times daily.  Marland Kitchen ROCKLATAN 0.02-0.005 % SOLN Apply to eye.   No current facility-administered medications for this visit. (Ophthalmic Drugs)   Current Outpatient Medications (Other)  Medication Sig  . ACCU-CHEK AVIVA PLUS test strip USE TO TEST BLOOD SUGARS DAILY.  Marland Kitchen amlodipine-olmesartan (AZOR) 10-20 MG tablet Take 1 tablet by mouth daily.  . Ascorbic Acid (VITAMIN C) 1000 MG tablet Take 1,000 mg by mouth daily.  Marland Kitchen aspirin EC 81 MG tablet Take 81 mg by mouth daily. Swallow whole.  . B Complex Vitamins (VITAMIN B COMPLEX) TABS Take 1 tablet by mouth daily.  . Blood Glucose Monitoring Suppl (GLUCOCOM BLOOD  GLUCOSE MONITOR) DEVI One Touch Verio Meter. FSBS BID. E11.319  . Cholecalciferol (VITAMIN D3 PO) Take 5,000 Units by mouth daily.  . empagliflozin (JARDIANCE) 10 MG TABS tablet   . ferrous sulfate 325 (65 FE) MG tablet Take by mouth.  . hydrochlorothiazide (HYDRODIURIL) 12.5 MG tablet   . Lancets (ONETOUCH DELICA PLUS LANCET33G) MISC Apply topically.  . linagliptin (TRADJENTA) 5 MG TABS tablet Take 1 tablet by mouth daily.  Marland Kitchen LINZESS 72 MCG capsule Take 72 mcg by mouth daily as needed (abdominal cramping).   Marland Kitchen losartan (COZAAR) 25 MG tablet Take 1 tablet by mouth daily.  Marland Kitchen MAGNESIUM PO Take 250 mg by mouth daily.  . metFORMIN (GLUCOPHAGE) 500 MG tablet Take 1,000 mg by mouth 2 (two) times daily with a meal.   . pantoprazole (PROTONIX) 40 MG tablet Take 40 mg by mouth daily.  . rosuvastatin (CRESTOR) 10 MG tablet Take 1 tablet (10 mg total) by mouth at bedtime for 90 doses.   No current facility-administered medications for this visit. (Other)      REVIEW OF SYSTEMS: ROS    Positive for: Gastrointestinal, Neurological, Musculoskeletal, Endocrine, Cardiovascular, Eyes, Respiratory   Negative for: Constitutional, Skin, Genitourinary, HENT, Psychiatric, Allergic/Imm, Heme/Lymph   Last edited by Celine Mans, COA on 07/09/2020  1:22 PM. (History)       ALLERGIES Allergies  Allergen Reactions  . Duloxetine Other (See Comments)    Pt says it also caused high pressure.  . Lisinopril Other (See Comments)    cough    PAST MEDICAL HISTORY Past Medical History:  Diagnosis Date  . Arthritis   . Barrett's esophagus   . Burning pain   . Cervical radiculopathy   . Cervical spondylosis   . Coronary artery calcification   . DDD (degenerative disc disease), cervical   . Diabetes mellitus    type 2   . GERD (gastroesophageal reflux disease)   . Hyperlipidemia   . Hypertension   . Hypertensive retinopathy    OU  . Macular degeneration    Dry OD; Wet OS  . Obesity   . Rectocele    . Sleep apnea    does not tolerate cpap  . Uterine prolapse    Past Surgical History:  Procedure Laterality Date  . BREAST BIOPSY  1980   left  . BREAST EXCISIONAL BIOPSY Left    [place]ening 1980's  . CATARACT EXTRACTION Bilateral    Dr. Randon Goldsmith  . EYE SURGERY Bilateral    Cat Sx - Dr. Randon Goldsmith  . SCAR REVISION  2001   both legs  . TOE SURGERY  1981   4th and 5th toe right foot  . TOE SURGERY  2002   great toe left  . TONSILLECTOMY    . TUBAL LIGATION      FAMILY HISTORY Family History  Problem Relation Age of Onset  . Heart disease Father   . Pancreatic cancer Father   . Breast cancer Paternal Aunt   . Stroke Mother   . Heart disease Brother   . Colon cancer Neg Hx   . Stomach cancer Neg Hx   . Colon polyps Neg Hx   . Rectal cancer Neg Hx     SOCIAL HISTORY Social History   Tobacco Use  . Smoking status: Former Smoker    Packs/day: 0.80    Years: 30.00    Pack years: 24.00    Types: Cigarettes    Quit date: 09/15/2011    Years since quitting: 8.8  . Smokeless tobacco: Never Used  . Tobacco comment: smoked 20 years; then quit 10; smoked 30 years plus  Vaping Use  . Vaping Use: Never used  Substance Use Topics  . Alcohol use: Yes    Comment: Every evening, 3 glasses of wine   . Drug use: No         OPHTHALMIC EXAM:  Base Eye Exam    Visual Acuity (Snellen - Linear)      Right Left   Dist cc 20/25 20/25   Dist ph cc NI NI   Correction: Glasses       Tonometry (Tonopen, 1:25 PM)      Right Left   Pressure 15 18       Pupils      Dark Light Shape React APD   Right 2 1 Round Minimal None   Left 2 1 Round Minimal None       Visual Fields (Counting fingers)      Left Right    Full Full       Extraocular Movement      Right Left    Full, Ortho Full, Ortho       Neuro/Psych    Oriented x3: Yes   Mood/Affect: Normal       Dilation    Both eyes: 1.0% Mydriacyl, 2.5% Phenylephrine @  1:26 PM        Slit Lamp and Fundus Exam    Slit  Lamp Exam      Right Left   Lids/Lashes Dermatochalasis - upper lid, Meibomian gland dysfunction Dermatochalasis - upper lid, Meibomian gland dysfunction   Conjunctiva/Sclera Melanosis, 1+ Injection Melanosis, 1+ Injection   Cornea trace Punctate epithelial erosions, arcus, high tear [place] 1+Punctate epithelial erosions, arcus, high tear lake   Anterior Chamber Deep and quiet Deep and quiet   Iris Round and moderately dilated, No NVI Round and moderately dilated to 5.465mm, No NVI   Lens PC IOL in good position with open PC PC IOL in good position with open PC   Vitreous Vitreous syneresis, scattered silicone oil bubbles Vitreous syneresis, silicone oil bubbles       Fundus Exam      Right Left   Disc Trace Pallor, +cupping, Thin inferior rim mild pallor, Sharp rim, Peripapillary atrophy, Thin inferior rim   C/D Ratio 0.7 0.6   Macula Flat, Blunted foveal reflex, Drusen, RPE mottling and clumping, No heme or edema good foveal reflex, RPE clumping and atrophy, peripapillary SRF/edema SN macula - persistent/slighlty improved, +ERM, pigmented atrophy/scarring IN macula   Vessels attenuated, Tortuous attenuated, mild tortuousity   Periphery Attached    Attached, bullous schisis cavity from 0200-0430 -- stable; no RT/RD          IMAGING AND PROCEDURES  Imaging and Procedures for 07/09/2020  OCT, Retina - OU - Both Eyes       Right Eye Quality was good. Central Foveal Thickness: 218. Progression has been stable. Findings include no IRF, no SRF, retinal drusen , outer retinal atrophy, vitreomacular adhesion , normal foveal contour, intraretinal hyper-reflective material.   Left Eye Quality was good. Central Foveal Thickness: 228. Progression has improved. Findings include normal foveal contour, no IRF, retinal drusen , subretinal hyper-reflective material, subretinal fluid, pigment epithelial detachment, choroidal neovascular membrane, outer retinal atrophy, vitreomacular adhesion  (Mild  Interval improvement in peripapillary SRF superior to disc, persistent PED).   Notes *Images captured and stored on drive  Diagnosis / Impression:  OD: nonexudative ARMD OS: mild Interval improvement in peripapillary SRF superior to disc, persistent PED  Clinical management:  See below  Abbreviations: NFP - Normal foveal profile. CME - cystoid macular edema. PED - pigment epithelial detachment. IRF - intraretinal fluid. SRF - subretinal fluid. EZ - ellipsoid zone. ERM - epiretinal membrane. ORA - outer retinal atrophy. ORT - outer retinal tubulation. SRHM - subretinal hyper-reflective material. IRHM - intraretinal hyper-reflective material        Intravitreal Injection, Pharmacologic Agent - OS - Left Eye       Time Out 07/09/2020. 1:51 PM. Confirmed correct patient, procedure, site, and patient consented.   Anesthesia Topical anesthesia was used. Anesthetic medications included Lidocaine 2%, Proparacaine 0.5%.   Procedure Preparation included eyelid speculum, 5% betadine to ocular surface. A supplied needle was used.   Injection:  1.25 mg Bevacizumab (AVASTIN) 1.25mg /0.4205mL SOLN   NDC: 09811-914-7850242-060-01, Lot: [id number] , Expiration date: 07/27/2020   Route: Intravitreal, Site: Left Eye, Waste: 0 mL  Post-op Post injection exam found visual acuity of at least counting fingers. The patient tolerated the procedure well. There were no complications. The patient received written and verbal post procedure care education. Post injection medications were not given.                ASSESSMENT/PLAN:    ICD-10-CM   1. 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 Intermediate stage nonexudative age-related macular degeneration of right eye  H35.3112   4. Left  retinoschisis  H33.102   5. Retinal edema  H35.81 OCT, Retina - OU - Both Eyes  6. Essential hypertension  I10   7. Hypertensive retinopathy of both eyes  H35.033   8. Pseudophakia, both eyes  Z96.1     1-3. Idiopathic polypoidal choroidal vasculopathy (PCV) OU  - Exudative age related macular degeneration, OS  - Nonexudative ARMD OD  - was seen in 2016 here by JDM  - transferred care to [facility] w/ Dr. Dorette Grate -- received intravitreal injxns  - then transferred to Dr. Quinn Axe -- last visit 07/2019 -- IVE OD #7 for DME  - has seen Dr. Sherryll Burger twice  - FA (11.29.21) shows peripapillary CNV OS + peripheral leakage OU consistent w/ PCV  - ICG (02.28.22) shows polypoidal choroidal vasculopathy (PCV); Focal hyper cyanscence superior to disc -- cluster of polyps   - here, s/p IVA OS #1 (11.29.21), #2 (12.31.21), #3 (01.31.22), #4 (02.28.22), #5 (03.30.22)  - OCT shows persistent peripapillary CNV w/ +SRF OS (SRF slightly improved); OD without exudative disease  - BCVA 20/25 OU (stable)  - recommend IVA OS #6 today, 04.27.22  - pt wishes to proceed  - RBA of procedure discussed, questions answered  - informed consent obtained and signed  - see procedure note - informed consent obtained, signed and scanned, 11.29.21 (IVA OS) - see procedure note  - f/u in 4 wks -- DFE/OCT  4,5. Retinoschisis OS  - focal, bullous schisis cavity in temporal periphery 2-430  - widefield OCT confirms schisis w/o RD  - stable  - continue monitoring for now  - may need laser if retinoschisis progresses posteriorly  6,7. Hypertensive retinopathy OU - discussed importance of tight BP control - monitor  8. Pseudophakia OU  - s/p CE/IOL (Dr. Randon Goldsmith)  - IOL in good position, doing well - monitor   Ophthalmic Meds Ordered this visit:  Meds ordered this encounter  Medications  . Bevacizumab (AVASTIN) SOLN 1.25 mg       Return in about 4 weeks (around 08/06/2020) for f/u exu ARMD OS, DFE, OCT.  There are no  Patient Instructions on file for this visit.  This document serves as a record of services personally performed by Karie Chimera, MD, PhD. It was created on their behalf by Annalee Genta, COMT. The creation of this record is the provider's dictation and/or activities during the visit.  Electronically signed by: Annalee Genta, COMT 07/10/20 8:43 PM  This document serves as a record of services personally performed by Karie Chimera, MD, PhD. It was created on their behalf by Glee Arvin. Manson Passey, OA an ophthalmic technician. The creation of this record is the provider's dictation and/or activities during the visit.    Electronically signed by: Glee Arvin. Manson Passey, [place] 04.27.2022 8:43 PM  Karie Chimera, M.D., Ph.D. Diseases & Surgery of the Retina and Vitreous [facility]  I have reviewed the above documentation for accuracy and completeness, and I agree with the above. Karie Chimera, M.D., Ph.D. 07/10/20 8:43 PM  Abbreviations: M myopia (nearsighted); A astigmatism; H hyperopia (farsighted); P presbyopia; Mrx spectacle prescription;  CTL contact lenses; OD right eye; OS left eye;  OU both eyes  XT exotropia; ET esotropia; PEK punctate epithelial keratitis; PEE punctate epithelial erosions; DES dry eye syndrome; MGD meibomian gland dysfunction; ATs artificial tears; PFAT's preservative free artificial tears; NSC nuclear sclerotic cataract; PSC posterior subcapsular cataract; ERM epi-retinal membrane; PVD posterior vitreous detachment; RD retinal detachment; DM diabetes mellitus; DR diabetic retinopathy; NPDR non-proliferative diabetic retinopathy; PDR proliferative diabetic retinopathy; CSME clinically significant macular edema; DME diabetic macular edema; dbh dot blot hemorrhages; CWS cotton wool spot; POAG primary open angle glaucoma; C/D cup-to-disc ratio; HVF humphrey visual field; GVF goldmann visual field; OCT optical coherence tomography; IOP intraocular pressure; BRVO Branch  retinal vein occlusion; CRVO central retinal vein occlusion; CRAO central retinal artery occlusion; BRAO branch retinal artery occlusion; RT retinal tear; SB scleral buckle; PPV pars plana vitrectomy; VH Vitreous hemorrhage; PRP panretinal laser photocoagulation; IVK intravitreal kenalog; VMT vitreomacular traction; MH Macular hole;  NVD neovascularization of the disc; NVE neovascularization elsewhere; AREDS age related eye disease study; ARMD age related macular degeneration; POAG primary open angle glaucoma; EBMD epithelial/anterior basement membrane dystrophy; ACIOL anterior chamber intraocular lens; IOL intraocular lens; PCIOL posterior chamber intraocular lens; Phaco/IOL phacoemulsification with intraocular lens placement; PRK photorefractive keratectomy; LASIK laser assisted in situ keratomileusis; HTN hypertension; DM diabetes mellitus; COPD chronic obstructive pulmonary disease

## 2020-07-09 ENCOUNTER — Ambulatory Visit (INDEPENDENT_AMBULATORY_CARE_PROVIDER_SITE_OTHER): Payer: Medicare Other | Admitting: Ophthalmology

## 2020-07-09 ENCOUNTER — Other Ambulatory Visit: Payer: Self-pay

## 2020-07-09 ENCOUNTER — Encounter (INDEPENDENT_AMBULATORY_CARE_PROVIDER_SITE_OTHER): Payer: Self-pay | Admitting: Ophthalmology

## 2020-07-09 DIAGNOSIS — H318 Other specified disorders of choroid: Secondary | ICD-10-CM

## 2020-07-09 DIAGNOSIS — H35033 Hypertensive retinopathy, bilateral: Secondary | ICD-10-CM

## 2020-07-09 DIAGNOSIS — H353221 Exudative age-related macular degeneration, left eye, with active choroidal neovascularization: Secondary | ICD-10-CM

## 2020-07-09 DIAGNOSIS — H353112 Nonexudative age-related macular degeneration, right eye, intermediate dry stage: Secondary | ICD-10-CM

## 2020-07-09 DIAGNOSIS — I10 Essential (primary) hypertension: Secondary | ICD-10-CM

## 2020-07-09 DIAGNOSIS — H3581 Retinal edema: Secondary | ICD-10-CM

## 2020-07-09 DIAGNOSIS — Z961 Presence of intraocular lens: Secondary | ICD-10-CM

## 2020-07-09 DIAGNOSIS — H33102 Unspecified retinoschisis, left eye: Secondary | ICD-10-CM | POA: Diagnosis not present

## 2020-07-10 ENCOUNTER — Encounter (INDEPENDENT_AMBULATORY_CARE_PROVIDER_SITE_OTHER): Payer: Self-pay | Admitting: Ophthalmology

## 2020-07-10 DIAGNOSIS — I2510 Atherosclerotic heart disease of native coronary artery without angina pectoris: Secondary | ICD-10-CM | POA: Insufficient documentation

## 2020-07-10 DIAGNOSIS — H353221 Exudative age-related macular degeneration, left eye, with active choroidal neovascularization: Secondary | ICD-10-CM

## 2020-07-10 MED ORDER — BEVACIZUMAB CHEMO INJECTION 1.25MG/0.05ML SYRINGE FOR KALEIDOSCOPE
1.2500 mg | INTRAVITREAL | Status: AC | PRN
  Administered 2020-07-10: 1.25 mg via INTRAVITREAL

## 2020-07-14 ENCOUNTER — Ambulatory Visit: Payer: Medicare Other | Admitting: Cardiology

## 2020-07-21 DIAGNOSIS — M47814 Spondylosis without myelopathy or radiculopathy, thoracic region: Secondary | ICD-10-CM | POA: Insufficient documentation

## 2020-07-30 NOTE — Progress Notes (Signed)
[facility] - Clinic Note  08/08/2020     CHIEF COMPLAINT Patient presents for Retina Follow Up   HISTORY OF PRESENT ILLNESS: Kylie Wilson is a 70 y.o. 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  This started 4 weeks ago.  I, the attending physician,  performed the HPI with the patient and updated documentation appropriately.          Comments    Patient here for 4 weeks retina follow up for exu ARMD OS. Patient states vision about the same. No eye pain. Has arthritis flare up and starting physical therapy. A glaucoma drop was added. Using BID OU.       Last edited by Rennis Chris, MD on 08/08/2020  4:05 PM. (History)    pt states vision is stable  Referring physician: Truett Perna, MD [id number] [place],  [place] [address]  HISTORICAL INFORMATION:   Selected notes from the MEDICAL RECORD NUMBER Previous [name] pt (2016) self-referred for possible RD LEE:  Ocular Hx- PCV diagnosed by Dr. Dorette Grate at [facility]; followed w/ Dr. Rhina Brackett for DM PMH-    CURRENT MEDICATIONS: Current Outpatient Medications (Ophthalmic Drugs)  Medication Sig  . dorzolamide-timolol (COSOPT) 22.3-6.8 MG/ML ophthalmic solution Place 1 drop into both eyes 2 (two) times daily.  Marland Kitchen ROCKLATAN 0.02-0.005 % SOLN Apply to eye.   No current facility-administered medications for this visit. (Ophthalmic Drugs)   Current Outpatient Medications (Other)  Medication Sig  . ACCU-CHEK AVIVA PLUS test strip USE TO TEST BLOOD SUGARS DAILY.  Marland Kitchen amlodipine-olmesartan (AZOR) 10-20 MG tablet Take 1 tablet by mouth daily.  . Ascorbic Acid (VITAMIN C) 1000 MG tablet Take 1,000 mg by mouth daily.  Marland Kitchen aspirin EC 81 MG tablet Take 81 mg by mouth daily. Swallow whole.  . B Complex Vitamins (VITAMIN B COMPLEX) TABS Take 1 tablet by mouth daily.  . Blood Glucose Monitoring Suppl (GLUCOCOM BLOOD GLUCOSE  MONITOR) DEVI One Touch Verio Meter. FSBS BID. E11.319  . Cholecalciferol (VITAMIN D3 PO) Take 5,000 Units by mouth daily.  . empagliflozin (JARDIANCE) 10 MG TABS tablet   . ferrous sulfate 325 (65 FE) MG tablet Take by mouth.  . hydrochlorothiazide (HYDRODIURIL) 12.5 MG tablet   . Lancets (ONETOUCH DELICA PLUS LANCET33G) MISC Apply topically.  . linagliptin (TRADJENTA) 5 MG TABS tablet Take 1 tablet by mouth daily.  Marland Kitchen LINZESS 72 MCG capsule Take 72 mcg by mouth daily as needed (abdominal cramping).   Marland Kitchen losartan (COZAAR) 25 MG tablet Take 1 tablet by mouth daily.  Marland Kitchen MAGNESIUM PO Take 250 mg by mouth daily.  . metFORMIN (GLUCOPHAGE) 500 MG tablet Take 1,000 mg by mouth 2 (two) times daily with a meal.   . pantoprazole (PROTONIX) 40 MG tablet Take 40 mg by mouth daily.  . rosuvastatin (CRESTOR) 10 MG tablet Take 1 tablet (10 mg total) by mouth at bedtime for 90 doses.   No current facility-administered medications for this visit. (Other)      REVIEW OF SYSTEMS: ROS    Positive for: Gastrointestinal, Neurological, Musculoskeletal, Endocrine, Cardiovascular, Eyes, Respiratory   Negative for: Constitutional, Skin, Genitourinary, HENT, Psychiatric, Allergic/Imm, Heme/Lymph   Last edited by Laddie Aquas, COA on 08/08/2020  7:53 AM. (History)       ALLERGIES Allergies  Allergen Reactions  . Duloxetine Other (See Comments)  Pt says it also caused high pressure.  . Lisinopril Other (See Comments)    cough    PAST MEDICAL HISTORY Past Medical History:  Diagnosis Date  . Arthritis   . Barrett's esophagus   . Burning pain   . Cervical radiculopathy   . Cervical spondylosis   . Coronary artery calcification   . DDD (degenerative disc disease), cervical   . Diabetes mellitus    type 2   . GERD (gastroesophageal reflux disease)   . Hyperlipidemia   . Hypertension   . Hypertensive retinopathy    OU  . Macular degeneration    Dry OD; Wet OS  . Obesity   . Rectocele   .  Sleep apnea    does not tolerate cpap  . Uterine prolapse    Past Surgical History:  Procedure Laterality Date  . BREAST BIOPSY  1980   left  . BREAST EXCISIONAL BIOPSY Left    Bening 1980's  . CATARACT EXTRACTION Bilateral    Dr. Randon Goldsmith  . EYE SURGERY Bilateral    Cat Sx - Dr. Randon Goldsmith  . SCAR REVISION  2001   both legs  . TOE SURGERY  1981   4th and 5th toe right foot  . TOE SURGERY  2002   great toe left  . TONSILLECTOMY    . TUBAL LIGATION      FAMILY HISTORY Family History  Problem Relation Age of Onset  . Heart disease Father   . Pancreatic cancer Father   . Breast cancer Paternal Aunt   . Stroke Mother   . Heart disease Brother   . Colon cancer Neg Hx   . Stomach cancer Neg Hx   . Colon polyps Neg Hx   . Rectal cancer Neg Hx     SOCIAL HISTORY Social History   Tobacco Use  . Smoking status: Former Smoker    Packs/day: 0.80    Years: 30.00    Pack years: 24.00    Types: Cigarettes    Quit date: 09/15/2011    Years since quitting: 8.9  . Smokeless tobacco: Never Used  . Tobacco comment: smoked 20 years; then quit 10; smoked 30 years plus  Vaping Use  . Vaping Use: Never used  Substance Use Topics  . Alcohol use: Yes    Comment: Every evening, 3 glasses of wine   . Drug use: No         OPHTHALMIC EXAM:  Base Eye Exam    Visual Acuity (Snellen - Linear)      Right Left   Dist cc 20/20 20/25 +2   Correction: Glasses       Tonometry (Tonopen, 7:49 AM)      Right Left   Pressure 15 13       Pupils      Dark Light Shape React APD   Right 2 1 Round Minimal None   Left 2 1 Round Minimal None       Visual Fields (Counting fingers)      Left Right    Full Full       Extraocular Movement      Right Left    Full, Ortho Full, Ortho       Neuro/Psych    Oriented x3: Yes   Mood/Affect: Normal       Dilation    Both eyes: 1.0% Mydriacyl, 2.5% Phenylephrine @ 7:49 AM        Slit Lamp and Fundus Exam  Slit Lamp Exam      Right  Left   Lids/Lashes Dermatochalasis - upper lid, Meibomian gland dysfunction Dermatochalasis - upper lid, Meibomian gland dysfunction   Conjunctiva/Sclera Melanosis, 1+ Injection Melanosis, 1+ Injection   Cornea trace Punctate epithelial erosions, arcus, high tear lake 1+Punctate epithelial erosions, arcus, high tear lake   Anterior Chamber Deep and quiet Deep and quiet   Iris Round and moderately dilated, No NVI Round and moderately dilated to 5.25mm, No NVI   Lens PC IOL in good position with open PC PC IOL in good position with open PC   Vitreous Vitreous syneresis, scattered silicone oil bubbles Vitreous syneresis, silicone oil bubbles       Fundus Exam      Right Left   Disc Trace Pallor, +cupping, Thin inferior rim mild pallor, Sharp rim, Peripapillary atrophy, Thin inferior rim   C/D Ratio 0.7 0.6   Macula Flat, Blunted foveal reflex, Drusen, RPE mottling and clumping, No heme or edema good foveal reflex, RPE clumping and atrophy, peripapillary SRF/edema SN macula - persistent/slighlty improved, +ERM, pigmented atrophy/scarring IN macula, no heme   Vessels attenuated, Tortuous attenuated, mild tortuousity   Periphery Attached    Attached, bullous schisis cavity from 0200-0430 -- stable; no RT/RD        Refraction    Wearing Rx      Sphere Cylinder Axis Add   Right -0.75 +2.00 163 +1.75   Left +0.00 +1.25 169 +1.75          IMAGING AND PROCEDURES  Imaging and Procedures for 08/08/2020  OCT, Retina - OU - Both Eyes       Right Eye Quality was good. Central Foveal Thickness: 215. Progression has been stable. Findings include no IRF, no SRF, retinal drusen , outer retinal atrophy, vitreomacular adhesion , normal foveal contour, intraretinal hyper-reflective material.   Left Eye Quality was good. Central Foveal Thickness: 228. Progression has improved. Findings include normal foveal contour, no IRF, retinal drusen , subretinal hyper-reflective material, subretinal fluid,  pigment epithelial detachment, choroidal neovascular membrane, outer retinal atrophy, vitreomacular adhesion  (Mild Interval improvement in peripapillary SRF superior to disc, persistent PED).   Notes *Images captured and stored on drive  Diagnosis / Impression:  OD: nonexudative ARMD OS: mild Interval improvement in peripapillary SRF superior to disc, persistent PED  Clinical management:  See below  Abbreviations: NFP - Normal foveal profile. CME - cystoid macular edema. PED - pigment epithelial detachment. IRF - intraretinal fluid. SRF - subretinal fluid. EZ - ellipsoid zone. ERM - epiretinal membrane. ORA - outer retinal atrophy. ORT - outer retinal tubulation. SRHM - subretinal hyper-reflective material. IRHM - intraretinal hyper-reflective material        Intravitreal Injection, Pharmacologic Agent - OS - Left Eye       Time Out 08/08/2020. 8:01 AM. Confirmed correct patient, procedure, site, and patient consented.   Anesthesia Topical anesthesia was used. Anesthetic medications included Lidocaine 2%, Proparacaine 0.5%.   Procedure Preparation included eyelid speculum, 5% betadine to ocular surface. A supplied needle was used.   Injection:  1.25 mg Bevacizumab (AVASTIN) 1.25mg /0.21mL SOLN   NDC: 86578-469-62, Lot: [id number] , Expiration date: 09/24/2020   Route: Intravitreal, Site: Left Eye, Waste: 0 mL  Post-op Post injection exam found visual acuity of at least counting fingers. The patient tolerated the procedure well. There were no complications. The patient received written and verbal post procedure care education. Post injection medications were not given.  ASSESSMENT/PLAN:    ICD-10-CM   1. 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 Retinal edema  H35.81 OCT, Retina - OU - Both Eyes  6. Essential hypertension  I10   7. Hypertensive retinopathy of both eyes  H35.033   8. Pseudophakia, both eyes  Z96.1     1-3. Idiopathic polypoidal choroidal vasculopathy (PCV) OU  - Exudative age related macular degeneration, OS  - Nonexudative ARMD OD  - was seen in 2016 here by J[facility]  - [facility] Dorette GratePostel -- received intravitreal injxns  - then transferred to Dr. Quinn Axeadiochenko -- last visit 07/2019 -- IVE OD #7 for DME  - has seen Dr. Sherryll BurgerShah twice  - FA (11.29.21) shows peripapillary CNV OS + peripheral leakage OU consistent w/ PCV  - ICG (02.28.22) shows polypoidal choroidal vasculopathy (PCV); Focal hyper cyanscence superior to disc -- cluster of polyps   - here, s/p IVA OS #1 (11.29.21), #2 (12.31.21), #3 (01.31.22), #4 (02.28.22), #5 (03.30.22), #6 (04.27.22)  - OCT shows persistent peripapillary CNV w/ +SRF OS (SRF slightly improved); OD without exudative disease  - BCVA 20/20 OD; 20/25 OS  - recommend IVA OS #7 today, 05.27.22  - pt wishes to proceed  - RBA of procedure discussed, questions answered  - informed consent obtained and signed  - see procedure note - informed consent obtained, signed and scanned, 11.29.21 (IVA OS) - see procedure note  - f/u in 4 wks -- DFE/OCT  4,5. Retinoschisis OS  - focal, bullous schisis cavity in temporal periphery 2-430  - widefield OCT confirms schisis w/o RD  - stable  - continue monitoring for now  - may need laser if retinoschisis progresses posteriorly  6,7. Hypertensive retinopathy OU - discussed importance of tight BP control - monitor  8. Pseudophakia OU  - s/p CE/IOL (Dr. Randon GoldsmithLyles)  - IOL in good position, doing well - monitor   Ophthalmic Meds Ordered this visit:  Meds ordered this encounter   Medications  . Bevacizumab (AVASTIN) SOLN 1.25 mg       Return in about 4 weeks (around 09/05/2020) for f/u exu ARMD OS, DFE, OCT.  There are no Patient Instructions on file for this visit.  This document serves as a record of services personally performed by Karie ChimeraBrian G. November Sypher, MD, PhD. It was created on their behalf by Joni Reiningobin Hodges, an ophthalmic technician. The creation of this record is the provider's dictation and/or activities during the visit.    Electronically signed by: Joni Reiningobin Hodges COA, 08/08/20  4:09 PM   This document serves as a record of services personally performed by Karie ChimeraBrian G. Greidy Sherard, MD, PhD. It was created on their behalf by Glee ArvinAmanda J. Manson PasseyBrown, OA an ophthalmic technician. The creation of this record is the provider's dictation and/or activities during the visit.    Electronically signed by: Glee ArvinAmanda J. Manson PasseyBrown, [place] 05.27.2022 4:09 PM   Karie ChimeraBrian G. Forney Kleinpeter, M.D., Ph.D. Diseases & Surgery of the Retina and Vitreous [facility] 08/08/2020  I have reviewed the above documentation for accuracy and completeness, and I agree with the above. Karie ChimeraBrian G. Haleigh Desmith, M.D., Ph.D. 08/08/20 4:09 PM  Abbreviations: M myopia (nearsighted); A astigmatism; H hyperopia (farsighted); P presbyopia; Mrx spectacle prescription;  CTL contact lenses; OD right eye; OS left eye; OU both eyes  XT exotropia; ET esotropia; PEK punctate epithelial keratitis; PEE punctate epithelial erosions; DES dry eye syndrome; MGD meibomian gland dysfunction; ATs artificial tears; PFAT's preservative free artificial tears; NSC nuclear sclerotic cataract; PSC posterior subcapsular cataract; ERM epi-retinal membrane; PVD posterior vitreous detachment; RD retinal detachment; DM diabetes mellitus; DR diabetic retinopathy; NPDR non-proliferative diabetic retinopathy; PDR proliferative diabetic retinopathy; CSME clinically significant macular edema; DME diabetic macular edema; dbh dot blot hemorrhages; CWS cotton  wool spot; POAG primary open angle glaucoma; C/D cup-to-disc ratio; HVF humphrey visual field; GVF goldmann visual field; OCT optical coherence tomography; IOP intraocular pressure; BRVO Branch retinal vein occlusion; CRVO central retinal vein occlusion; CRAO central retinal artery occlusion; BRAO branch retinal artery occlusion; RT retinal tear; SB scleral buckle; PPV pars plana vitrectomy; VH Vitreous hemorrhage; PRP panretinal laser photocoagulation; IVK intravitreal kenalog; VMT vitreomacular traction; MH Macular hole;  NVD neovascularization of the disc; NVE neovascularization elsewhere; AREDS age related eye disease study; ARMD age related macular degeneration; POAG primary open angle glaucoma; EBMD epithelial/anterior basement membrane dystrophy; ACIOL anterior chamber intraocular lens; IOL intraocular lens; PCIOL posterior chamber intraocular lens; Phaco/IOL phacoemulsification with intraocular lens placement; PRK photorefractive keratectomy; LASIK laser assisted in situ keratomileusis; HTN hypertension; DM diabetes mellitus; COPD chronic obstructive pulmonary disease

## 2020-08-06 ENCOUNTER — Ambulatory Visit: Payer: Medicare Other | Admitting: Podiatry

## 2020-08-08 ENCOUNTER — Other Ambulatory Visit: Payer: Self-pay

## 2020-08-08 ENCOUNTER — Ambulatory Visit (INDEPENDENT_AMBULATORY_CARE_PROVIDER_SITE_OTHER): Payer: Medicare Other | Admitting: Ophthalmology

## 2020-08-08 ENCOUNTER — Encounter (INDEPENDENT_AMBULATORY_CARE_PROVIDER_SITE_OTHER): Payer: Self-pay | Admitting: Ophthalmology

## 2020-08-08 DIAGNOSIS — H353221 Exudative age-related macular degeneration, left eye, with active choroidal neovascularization: Secondary | ICD-10-CM | POA: Diagnosis not present

## 2020-08-08 DIAGNOSIS — H33102 Unspecified retinoschisis, left eye: Secondary | ICD-10-CM

## 2020-08-08 DIAGNOSIS — H3581 Retinal edema: Secondary | ICD-10-CM

## 2020-08-08 DIAGNOSIS — Z961 Presence of intraocular lens: Secondary | ICD-10-CM

## 2020-08-08 DIAGNOSIS — H318 Other specified disorders of choroid: Secondary | ICD-10-CM | POA: Diagnosis not present

## 2020-08-08 DIAGNOSIS — I10 Essential (primary) hypertension: Secondary | ICD-10-CM

## 2020-08-08 DIAGNOSIS — H353112 Nonexudative age-related macular degeneration, right eye, intermediate dry stage: Secondary | ICD-10-CM

## 2020-08-08 DIAGNOSIS — H35033 Hypertensive retinopathy, bilateral: Secondary | ICD-10-CM

## 2020-08-08 MED ORDER — BEVACIZUMAB CHEMO INJECTION 1.25MG/0.05ML SYRINGE FOR KALEIDOSCOPE
1.2500 mg | INTRAVITREAL | Status: AC | PRN
  Administered 2020-08-08: 1.25 mg via INTRAVITREAL

## 2020-09-01 NOTE — Progress Notes (Signed)
[facility] - Clinic Note  09/05/2020     CHIEF COMPLAINT Patient presents for Retina Follow Up   HISTORY OF PRESENT ILLNESS: Kylie Wilson is a 70 y.o. female who presents to the clinic today for:   HPI     Retina Follow Up   Patient presents with  Wet AMD.  In left eye.  This started 4.  Duration of weeks.  I, the attending physician,  performed the HPI with the patient and updated documentation appropriately.        Comments   Patient here for 4 weeks retina follow up for exu ARMD OS. Patient states vision seems worse. Not better. May be glasses. Had went to different frame. Cant see as well. Having an epidural for back and spine. Then physical therapy.       Last edited by Rennis Chris, MD on 09/06/2020  1:17 AM.     Patient states vision seems worse OS. BS has been running a little high.   Referring physician: Truett Perna, MD [address] [place],  [place] [address]  HISTORICAL INFORMATION:   Selected notes from the MEDICAL RECORD NUMBER Previous [name] pt (2016) self-referred for possible RD LEE:  Ocular Hx- PCV diagnosed by Dr. Dorette Grate at [facility]; followed w/ Dr. Rhina Brackett for DM PMH-    CURRENT MEDICATIONS: Current Outpatient Medications (Ophthalmic Drugs)  Medication Sig   dorzolamide-timolol (COSOPT) 22.3-6.8 MG/ML ophthalmic solution Place 1 drop into both eyes 2 (two) times daily.   ROCKLATAN 0.02-0.005 % SOLN Apply to eye.   No current facility-administered medications for this visit. (Ophthalmic Drugs)   Current Outpatient Medications (Other)  Medication Sig   ACCU-CHEK AVIVA PLUS test strip USE TO TEST BLOOD SUGARS DAILY.   amlodipine-olmesartan (AZOR) 10-20 MG tablet Take 1 tablet by mouth daily.   Ascorbic Acid (VITAMIN C) 1000 MG tablet Take 1,000 mg by mouth daily.   aspirin EC 81 MG tablet Take 81 mg by mouth daily. Swallow whole.   B Complex Vitamins (VITAMIN B COMPLEX) TABS Take 1 tablet by mouth  daily.   Blood Glucose Monitoring Suppl (GLUCOCOM BLOOD GLUCOSE MONITOR) DEVI One Touch Verio Meter. FSBS BID. E11.319   Cholecalciferol (VITAMIN D3 PO) Take 5,000 Units by mouth daily.   empagliflozin (JARDIANCE) 10 MG TABS tablet    ferrous sulfate 325 (65 FE) MG tablet Take by mouth.   hydrochlorothiazide (HYDRODIURIL) 12.5 MG tablet    Lancets (ONETOUCH DELICA PLUS LANCET33G) MISC Apply topically.   linagliptin (TRADJENTA) 5 MG TABS tablet Take 1 tablet by mouth daily.   LINZESS 72 MCG capsule Take 72 mcg by mouth daily as needed (abdominal cramping).    losartan (COZAAR) 25 MG tablet Take 1 tablet by mouth daily.   MAGNESIUM PO Take 250 mg by mouth daily.   metFORMIN (GLUCOPHAGE) 500 MG tablet Take 1,000 mg by mouth 2 (two) times daily with a meal.    pantoprazole (PROTONIX) 40 MG tablet Take 40 mg by mouth daily.   rosuvastatin (CRESTOR) 10 MG tablet Take 1 tablet (10 mg total) by mouth at bedtime for 90 doses.   No current facility-administered medications for this visit. (Other)      REVIEW OF SYSTEMS: ROS   Positive for: Gastrointestinal, Neurological, Musculoskeletal, Endocrine, Cardiovascular, Eyes, Respiratory Negative for: Constitutional, Skin, Genitourinary, HENT, Psychiatric, Allergic/Imm, Heme/Lymph Last edited by Laddie Aquas, COA on 09/05/2020  2:20 PM.        ALLERGIES Allergies  Allergen  Reactions   Duloxetine Other (See Comments)    Pt says it also caused high pressure.   Lisinopril Other (See Comments)    cough    PAST MEDICAL HISTORY Past Medical History:  Diagnosis Date   Arthritis    Barrett's esophagus    Burning pain    Cervical radiculopathy    Cervical spondylosis    Coronary artery calcification    DDD (degenerative disc disease), cervical    Diabetes mellitus    type 2    GERD (gastroesophageal reflux disease)    Hyperlipidemia    Hypertension    Hypertensive retinopathy    OU   Macular degeneration    Dry OD; Wet OS    Obesity    Rectocele    Sleep apnea    does not tolerate cpap   Uterine prolapse    Past Surgical History:  Procedure Laterality Date   BREAST BIOPSY  1980   left   BREAST EXCISIONAL BIOPSY Left    Bening 1980's   CATARACT EXTRACTION Bilateral    Dr. Randon Goldsmith   EYE SURGERY Bilateral    Cat Sx - Dr. Randon Goldsmith   SCAR REVISION  2001   both legs   TOE SURGERY  1981   4th and 5th toe right foot   TOE SURGERY  2002   great toe left   TONSILLECTOMY     TUBAL LIGATION      FAMILY HISTORY Family History  Problem Relation Age of Onset   Heart disease Father    Pancreatic cancer Father    Breast cancer Paternal Aunt    Stroke Mother    Heart disease Brother    Colon cancer Neg Hx    Stomach cancer Neg Hx    Colon polyps Neg Hx    Rectal cancer Neg Hx     SOCIAL HISTORY Social History   Tobacco Use   Smoking status: Former    Packs/day: 0.80    Years: 30.00    Pack years: 24.00    Types: Cigarettes    Quit date: 09/15/2011    Years since quitting: 8.9   Smokeless tobacco: Never   Tobacco comments:    smoked 20 years; then quit 10; smoked 30 years plus  Vaping Use   Vaping Use: Never used  Substance Use Topics   Alcohol use: Yes    Comment: Every evening, 3 glasses of wine    Drug use: No         OPHTHALMIC EXAM:  Base Eye Exam     Visual Acuity (Snellen - Linear)       Right Left   Dist cc 20/20 -2 20/25   Dist ph cc  NI    Correction: Glasses         Tonometry (Tonopen, 2:18 PM)       Right Left   Pressure 14 13         Pupils       Dark Light Shape React APD   Right 2 1 Round Minimal None   Left 2 1 Round Minimal None         Visual Fields (Counting fingers)       Left Right    Full Full         Extraocular Movement       Right Left    Full, Ortho Full, Ortho         Neuro/Psych     Oriented x3: Yes  Mood/Affect: Normal         Dilation     Both eyes: 1.0% Mydriacyl, 2.5% Phenylephrine @ 2:18 PM            Slit Lamp and Fundus Exam     Slit Lamp Exam       Right Left   Lids/Lashes Dermatochalasis - upper lid, Meibomian gland dysfunction Dermatochalasis - upper lid, Meibomian gland dysfunction   Conjunctiva/Sclera Melanosis, 1+ Injection Melanosis, 1+ Injection   Cornea trace Punctate epithelial erosions, arcus, high tear lake 1+Punctate epithelial erosions, arcus, high tear lake   Anterior Chamber Deep and quiet Deep and quiet   Iris Round and moderately dilated, No NVI Round and moderately dilated to 5.315mm, No NVI   Lens PC IOL in good position with open PC PC IOL in good position with open PC   Vitreous Vitreous syneresis, scattered silicone oil bubbles Vitreous syneresis, silicone oil bubbles         Fundus Exam       Right Left   Disc Trace Pallor, +cupping, Thin inferior rim mild pallor, Sharp rim, Peripapillary atrophy, Thin inferior rim   C/D Ratio 0.7 0.6   Macula Flat, Blunted foveal reflex, Drusen, RPE mottling and clumping, No heme or edema good foveal reflex, RPE clumping and atrophy, peripapillary SRF/edema SN macula - persistent, +ERM, pigmented atrophy/scarring IN macula, no heme   Vessels attenuated, Tortuous attenuated, mild tortuousity   Periphery Attached    Attached, bullous schisis cavity from 0200-0430 -- stable; no RT/RD           Refraction     Wearing Rx       Sphere Cylinder Axis Add   Right -0.75 +2.00 163 +1.75   Left +0.00 +1.25 169 +1.75            IMAGING AND PROCEDURES  Imaging and Procedures for 09/05/2020  OCT, Retina - OU - Both Eyes       Right Eye Quality was good. Central Foveal Thickness: 216. Progression has been stable. Findings include no IRF, no SRF, retinal drusen , outer retinal atrophy, vitreomacular adhesion , normal foveal contour, intraretinal hyper-reflective material.   Left Eye Quality was good. Central Foveal Thickness: 231. Progression has been stable. Findings include normal foveal contour, no IRF, retinal  drusen , subretinal hyper-reflective material, subretinal fluid, pigment epithelial detachment, choroidal neovascular membrane, outer retinal atrophy, vitreomacular adhesion (Persistent peripapillary SRF superior to disc--slightly increased, persistent PED).   Notes *Images captured and stored on drive  Diagnosis / Impression:  OD: nonexudative ARMD OS: persistent peripapillary SRF superior to disc---slightly increased, persistent PED  Clinical management:  See below  Abbreviations: NFP - Normal foveal profile. CME - cystoid macular edema. PED - pigment epithelial detachment. IRF - intraretinal fluid. SRF - subretinal fluid. EZ - ellipsoid zone. ERM - epiretinal membrane. ORA - outer retinal atrophy. ORT - outer retinal tubulation. SRHM - subretinal hyper-reflective material. IRHM - intraretinal hyper-reflective material      Intravitreal Injection, Pharmacologic Agent - OS - Left Eye       Time Out 09/05/2020. 3:06 PM. Confirmed correct patient, procedure, site, and patient consented.   Anesthesia Topical anesthesia was used. Anesthetic medications included Lidocaine 2%, Proparacaine 0.5%.   Procedure Preparation included 5% betadine to ocular surface, eyelid speculum. A supplied needle was used.   Injection: 1.25 mg Bevacizumab 1.25mg /0.5505ml   Route: Intravitreal, Site: Left Eye   NDC: [id number], Lot: [id number] , Expiration date: 10/22/2020, Waste: 0 mL  ASSESSMENT/PLAN:    ICD-10-CM   1. 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 Retinal edema  H35.81 OCT, Retina - OU - Both Eyes    6.  Essential hypertension  I10     7. Hypertensive retinopathy of both eyes  H35.033     8. Pseudophakia, both eyes  Z96.1       1-3. Idiopathic polypoidal choroidal vasculopathy (PCV) OU  - Exudative age related macular degeneration, OS  - Nonexudative ARMD OD  - was seen in 2016 here by JDM  - transferred care to [facility] w/ Dr. Dorette Grate -- received intravitreal injxns  - then transferred to Dr. Quinn Axe -- last visit 07/2019 -- IVE OD #7 for DME  - has seen Dr. Sherryll Burger twice  - FA (11.29.21) shows peripapillary CNV OS + peripheral leakage OU consistent w/ PCV  - ICG (02.28.22) shows polypoidal choroidal vasculopathy (PCV); Focal hyper cyanscence superior to disc -- cluster of polyps   - here, s/p IVA OS #1 (11.29.21), #2 (12.31.21), #3 (01.31.22), #4 (02.28.22), #5 (03.30.22), #6 (04.27.22), #7 (05.27.22)  - OCT shows persistent peripapillary CNV w/ +SRF OS (SRF slightly increased); OD without exudative disease  - BCVA 20/20-2 OD; 20/25 OS--stable OU  - recommend IVA OS #8 today, 06.24.22  - pt wishes to proceed  - RBA of procedure discussed, questions answered  - informed consent obtained and signed  - see procedure note - informed consent obtained, signed and scanned, 11.29.21 (IVA OS) - see procedure note  - f/u in 4 wks -- DFE/OCT  4,5. Retinoschisis OS  - focal, bullous schisis cavity in temporal periphery 2-430  - widefield OCT confirms schisis w/o RD  - stable  - continue monitoring for now  - may need laser if retinoschisis progresses posteriorly  6,7. Hypertensive retinopathy OU - discussed importance of tight BP control - monitor  8. Pseudophakia OU  - s/p CE/IOL (Dr. Randon Goldsmith)  - IOL in good position, doing well - monitor   Ophthalmic Meds Ordered this visit:  Meds ordered this encounter  Medications   Bevacizumab (AVASTIN) SOLN 1.25 mg        Return in about 4 weeks (around 10/03/2020) for DFE, OCT.  There are no Patient Instructions on file for this  visit.  This document serves as a record of services personally performed by Karie Chimera, MD, PhD. It was created on their behalf by Glee Arvin. Manson Passey, OA an ophthalmic technician. The creation of this record is the provider's dictation and/or activities during the visit.    Electronically signed by: Glee Arvin. Manson Passey, OA 06.20.2022 1:19 AM  This document serves as a record of services personally performed by Karie Chimera, MD, PhD. It was created on their behalf by Annalee Genta, COMT. The creation of this record is the provider's dictation and/or activities during the visit.  Electronically signed by: Annalee Genta, COMT 09/06/20 1:19 AM  Karie Chimera, M.D., Ph.D. Diseases & Surgery of the Retina and Vitreous [facility]  I have reviewed the above documentation for accuracy and completeness, and I agree with the above. Kylie Wilson  Wynona Neat, M.D., Ph.D. 09/06/20 1:20 AM   Abbreviations: M myopia (nearsighted); A astigmatism; H hyperopia (farsighted); P presbyopia; Mrx spectacle prescription;  CTL contact lenses; OD right eye; OS left eye; OU both eyes  XT exotropia; ET esotropia; PEK punctate epithelial keratitis; PEE punctate epithelial erosions; DES dry eye syndrome; MGD meibomian gland dysfunction; ATs artificial tears; PFAT's preservative free artificial tears; NSC nuclear sclerotic cataract; PSC posterior subcapsular cataract; ERM epi-retinal membrane; PVD posterior vitreous detachment; RD retinal detachment; DM diabetes mellitus; DR diabetic retinopathy; NPDR non-proliferative diabetic retinopathy; PDR proliferative diabetic retinopathy; CSME clinically significant macular edema; DME diabetic macular edema; dbh dot blot hemorrhages; CWS cotton wool spot; POAG primary open angle glaucoma; C/D cup-to-disc ratio; HVF humphrey visual field; GVF goldmann visual field; OCT optical coherence tomography; IOP intraocular pressure; BRVO Branch retinal vein occlusion; CRVO central  retinal vein occlusion; CRAO central retinal artery occlusion; BRAO branch retinal artery occlusion; RT retinal tear; SB scleral buckle; PPV pars plana vitrectomy; VH Vitreous hemorrhage; PRP panretinal laser photocoagulation; IVK intravitreal kenalog; VMT vitreomacular traction; MH Macular hole;  NVD neovascularization of the disc; NVE neovascularization elsewhere; AREDS age related eye disease study; ARMD age related macular degeneration; POAG primary open angle glaucoma; EBMD epithelial/anterior basement membrane dystrophy; ACIOL anterior chamber intraocular lens; IOL intraocular lens; PCIOL posterior chamber intraocular lens; Phaco/IOL phacoemulsification with intraocular lens placement; PRK photorefractive keratectomy; LASIK laser assisted in situ keratomileusis; HTN hypertension; DM diabetes mellitus; COPD chronic obstructive pulmonary disease

## 2020-09-05 ENCOUNTER — Ambulatory Visit (INDEPENDENT_AMBULATORY_CARE_PROVIDER_SITE_OTHER): Payer: Medicare Other | Admitting: Ophthalmology

## 2020-09-05 ENCOUNTER — Other Ambulatory Visit: Payer: Self-pay

## 2020-09-05 ENCOUNTER — Encounter (INDEPENDENT_AMBULATORY_CARE_PROVIDER_SITE_OTHER): Payer: Self-pay | Admitting: Ophthalmology

## 2020-09-05 DIAGNOSIS — H35033 Hypertensive retinopathy, bilateral: Secondary | ICD-10-CM

## 2020-09-05 DIAGNOSIS — I10 Essential (primary) hypertension: Secondary | ICD-10-CM | POA: Diagnosis not present

## 2020-09-05 DIAGNOSIS — H353112 Nonexudative age-related macular degeneration, right eye, intermediate dry stage: Secondary | ICD-10-CM | POA: Diagnosis not present

## 2020-09-05 DIAGNOSIS — H33102 Unspecified retinoschisis, left eye: Secondary | ICD-10-CM

## 2020-09-05 DIAGNOSIS — H3581 Retinal edema: Secondary | ICD-10-CM | POA: Diagnosis not present

## 2020-09-05 DIAGNOSIS — H318 Other specified disorders of choroid: Secondary | ICD-10-CM | POA: Diagnosis not present

## 2020-09-05 DIAGNOSIS — H353221 Exudative age-related macular degeneration, left eye, with active choroidal neovascularization: Secondary | ICD-10-CM

## 2020-09-05 DIAGNOSIS — Z961 Presence of intraocular lens: Secondary | ICD-10-CM

## 2020-09-06 ENCOUNTER — Encounter (INDEPENDENT_AMBULATORY_CARE_PROVIDER_SITE_OTHER): Payer: Self-pay | Admitting: Ophthalmology

## 2020-09-06 MED ORDER — BEVACIZUMAB CHEMO INJECTION 1.25MG/0.05ML SYRINGE FOR KALEIDOSCOPE
1.2500 mg | INTRAVITREAL | Status: AC | PRN
  Administered 2020-09-05: 1.25 mg via INTRAVITREAL

## 2020-09-08 ENCOUNTER — Other Ambulatory Visit: Payer: Self-pay | Admitting: Cardiology

## 2020-09-08 DIAGNOSIS — I10 Essential (primary) hypertension: Secondary | ICD-10-CM

## 2020-09-24 ENCOUNTER — Ambulatory Visit: Payer: Medicare Other | Admitting: Podiatry

## 2020-09-29 ENCOUNTER — Ambulatory Visit (INDEPENDENT_AMBULATORY_CARE_PROVIDER_SITE_OTHER): Payer: Medicare Other | Admitting: Podiatry

## 2020-09-29 ENCOUNTER — Encounter: Payer: Self-pay | Admitting: Podiatry

## 2020-09-29 ENCOUNTER — Other Ambulatory Visit: Payer: Self-pay

## 2020-09-29 DIAGNOSIS — L84 Corns and callosities: Secondary | ICD-10-CM | POA: Diagnosis not present

## 2020-09-29 DIAGNOSIS — M79674 Pain in right toe(s): Secondary | ICD-10-CM

## 2020-09-29 DIAGNOSIS — B351 Tinea unguium: Secondary | ICD-10-CM

## 2020-09-29 DIAGNOSIS — E119 Type 2 diabetes mellitus without complications: Secondary | ICD-10-CM | POA: Diagnosis not present

## 2020-09-29 DIAGNOSIS — M79675 Pain in left toe(s): Secondary | ICD-10-CM | POA: Diagnosis not present

## 2020-09-30 ENCOUNTER — Other Ambulatory Visit (INDEPENDENT_AMBULATORY_CARE_PROVIDER_SITE_OTHER): Payer: Self-pay | Admitting: Ophthalmology

## 2020-10-02 NOTE — Progress Notes (Signed)
Subjective: Kylie Wilson is a pleasant 70 y.o. female patient seen today for preventative diabetic foot care for corns bilateral 5th digits and  painful thick toenails that are difficult to trim. Pain interferes with ambulation. Aggravating factors include wearing enclosed shoe gear. Pain is relieved with periodic professional debridement.  Patient states her blood glucose was 148 mg/dl today.  PCP is Truett Perna, MD. Last visit was: 07/18/2020.  Allergies  Allergen Reactions   Duloxetine Other (See Comments)    Pt says it also caused high pressure.   Lisinopril Other (See Comments)    cough    Objective: Physical Exam  General: Kylie Wilson is a pleasant 70 y.o. African American female, in NAD. AAO x 3.   Vascular:  Capillary refill time to digits immediate b/l. Palpable pedal pulses b/l LE. Pedal hair absent. Lower extremity skin temperature gradient within normal limits. No pain with calf compression b/l.  Dermatological:  Pedal skin with normal turgor, texture and tone b/l lower extremities. No open wounds b/l lower extremities. No interdigital macerations b/l lower extremities. Toenails 1-5 b/l elongated, discolored, dystrophic, thickened, crumbly with subungual debris and tenderness to dorsal palpation. Hyperkeratotic lesion(s) L 5th toe and R 5th toe.  No erythema, no edema, no drainage, no fluctuance.  Musculoskeletal:  Normal muscle strength 5/5 to all lower extremity muscle groups bilaterally. No pain crepitus or joint limitation noted with ROM b/l. Hammertoe(s) noted to the L 5th toe and R 5th toe.  Neurological:  Protective sensation intact 5/5 intact bilaterally with 10g monofilament b/l. Vibratory sensation intact b/l.  Assessment and Plan:  1. Pain due to onychomycosis of toenails of both feet   2. Corns   3. Controlled type 2 diabetes mellitus without complication, without long-term current use of insulin (HCC)     -Examined patient. -Continue  diabetic foot care principles. -Patient to continue soft, supportive shoe gear daily. -Toenails 1-5 b/l were debrided in length and girth with sterile nail nippers and dremel without iatrogenic bleeding.  -Corn(s) L 5th toe and R 5th toe pared utilizing sterile scalpel blade without complication or incident. Total number debrided=2. -Patient to report any pedal injuries to medical professional immediately. -Patient/POA to call should there be question/concern in the interim.  Return in about 3 months (around 12/30/2020).  Freddie Breech, DPM

## 2020-10-03 ENCOUNTER — Encounter (INDEPENDENT_AMBULATORY_CARE_PROVIDER_SITE_OTHER): Payer: Medicare Other | Admitting: Ophthalmology

## 2020-10-03 DIAGNOSIS — I10 Essential (primary) hypertension: Secondary | ICD-10-CM

## 2020-10-03 DIAGNOSIS — H3581 Retinal edema: Secondary | ICD-10-CM

## 2020-10-03 DIAGNOSIS — H318 Other specified disorders of choroid: Secondary | ICD-10-CM

## 2020-10-03 DIAGNOSIS — Z961 Presence of intraocular lens: Secondary | ICD-10-CM

## 2020-10-03 DIAGNOSIS — H353221 Exudative age-related macular degeneration, left eye, with active choroidal neovascularization: Secondary | ICD-10-CM

## 2020-10-03 DIAGNOSIS — H353112 Nonexudative age-related macular degeneration, right eye, intermediate dry stage: Secondary | ICD-10-CM

## 2020-10-03 DIAGNOSIS — H35033 Hypertensive retinopathy, bilateral: Secondary | ICD-10-CM

## 2020-10-03 DIAGNOSIS — H33102 Unspecified retinoschisis, left eye: Secondary | ICD-10-CM

## 2020-10-03 NOTE — Progress Notes (Addendum)
[facility] - Clinic Note  10/06/2020     CHIEF COMPLAINT Patient presents for Retina Follow Up   HISTORY OF PRESENT ILLNESS: Kylie Wilson is a 70 y.o. female who presents to the clinic today for:   HPI     Retina Follow Up   Patient presents with  Wet AMD.  In left eye.  Duration of 4 weeks.  Since onset it is gradually improving.  I, the attending physician,  performed the HPI with the patient and updated documentation appropriately.        Comments   4 week follow up Exu ARMD OS- Patient thinks vision is a little better.  Taking Brimonidine BID and Dorz/Timolol BID OU... Has been out of Kylie Wilson x3 weeks.  She has an appt with Dr. Dione Wilson in Aug.    Patient had a spinal epidural 3 weeks ago. BS 148 this am A1C 7.6      Last edited by Kylie Chris, MD on 10/06/2020  2:32 PM.      Patient states vision seems worse OS. BS has been running a little high.   Referring physician: Truett Perna, MD [id number] [place],  [place] [address]  HISTORICAL INFORMATION:   Selected notes from the MEDICAL RECORD NUMBER Previous Kylie Wilson pt (2016) self-referred for possible RD LEE:  Ocular Hx- PCV diagnosed by Dr. Dorette Wilson at [facility]; followed w/ Dr. Rhina Wilson for DM PMH-    CURRENT MEDICATIONS: Current Outpatient Medications (Ophthalmic Drugs)  Medication Sig   brimonidine (ALPHAGAN) 0.2 % ophthalmic solution Place 1 drop into both eyes 2 times daily.   dorzolamide-timolol (COSOPT) 22.3-6.8 MG/ML ophthalmic solution Place 1 drop into both eyes 2 (two) times daily.   Netarsudil-Latanoprost (Kylie Wilson) 0.02-0.005 % SOLN Apply to eye.   No current facility-administered medications for this visit. (Ophthalmic Drugs)   Current Outpatient Medications (Other)  Medication Sig   amlodipine-olmesartan (AZOR) 10-20 MG tablet Take 1 tablet by mouth daily.   Ascorbic Acid (VITAMIN C) 1000 MG tablet Take 1,000 mg by mouth daily.   aspirin EC 81 MG  tablet Take 81 mg by mouth daily. Swallow whole.   B Complex Vitamins (VITAMIN B COMPLEX) TABS Take 1 tablet by mouth daily.   Cholecalciferol (VITAMIN D3 PO) Take 5,000 Units by mouth daily.   empagliflozin (JARDIANCE) 25 MG TABS tablet Take 1 tablet by mouth daily.   ferrous sulfate 325 (65 FE) MG tablet Take by mouth.   hydrochlorothiazide (HYDRODIURIL) 12.5 MG tablet    hydrochlorothiazide (HYDRODIURIL) 25 MG tablet TAKE 1 TABLET (25 MG TOTAL) BY MOUTH IN THE MORNING.   linagliptin (TRADJENTA) 5 MG TABS tablet Take 1 tablet by mouth daily.   LINZESS 72 MCG capsule Take 72 mcg by mouth daily as needed (abdominal cramping).    losartan (COZAAR) 25 MG tablet Take 1 tablet by mouth daily.   MAGNESIUM PO Take 250 mg by mouth daily.   metFORMIN (GLUCOPHAGE) 500 MG tablet Take 1,000 mg by mouth 2 (two) times daily with a meal.    metFORMIN (GLUCOPHAGE) 500 MG tablet Take by mouth.   pantoprazole (PROTONIX) 40 MG tablet Take 40 mg by mouth daily.   ACCU-CHEK AVIVA PLUS test strip USE TO TEST BLOOD SUGARS DAILY.   Blood Glucose Monitoring Suppl (GLUCOCOM BLOOD GLUCOSE MONITOR) DEVI Kylie Wilson Meter. FSBS BID. E11.319   Lancets (ONETOUCH DELICA PLUS LANCET33G) MISC Apply topically.   rosuvastatin (CRESTOR) 10 MG tablet Take 1 tablet (10 mg  total) by mouth at bedtime for 90 doses.   No current facility-administered medications for this visit. (Other)      REVIEW OF SYSTEMS: ROS   Positive for: Gastrointestinal, Neurological, Musculoskeletal, Endocrine, Cardiovascular, Eyes, Respiratory Negative for: Constitutional, Skin, Genitourinary, HENT, Psychiatric, Allergic/Imm, Heme/Lymph Last edited by Kylie Wilson, COA on 10/06/2020  9:43 AM.         ALLERGIES Allergies  Allergen Reactions   Duloxetine Other (See Comments)    Pt says it also caused high pressure.   Lisinopril Other (See Comments)    cough    PAST MEDICAL HISTORY Past Medical History:  Diagnosis Date   Arthritis     Barrett's esophagus    Burning pain    Cervical radiculopathy    Cervical spondylosis    Coronary artery calcification    DDD (degenerative disc disease), cervical    Diabetes mellitus    type 2    GERD (gastroesophageal reflux disease)    Hyperlipidemia    Hypertension    Hypertensive retinopathy    OU   Macular degeneration    Dry OD; Wet OS   Obesity    Rectocele    Sleep apnea    does not tolerate cpap   Uterine prolapse    Past Surgical History:  Procedure Laterality Date   BREAST BIOPSY  1980   left   BREAST EXCISIONAL BIOPSY Left    Bening 1980's   CATARACT EXTRACTION Bilateral    Dr. Randon Wilson   EYE SURGERY Bilateral    Cat Sx - Dr. Randon Wilson   SCAR REVISION  2001   both legs   TOE SURGERY  1981   4th and 5th toe right foot   TOE SURGERY  2002   great toe left   TONSILLECTOMY     TUBAL LIGATION      FAMILY HISTORY Family History  Problem Relation Age of Onset   Heart disease Father    Pancreatic cancer Father    Breast cancer Paternal Aunt    Stroke Mother    Heart disease Brother    Colon cancer Neg Hx    Stomach cancer Neg Hx    Colon polyps Neg Hx    Rectal cancer Neg Hx     SOCIAL HISTORY Social History   Tobacco Use   Smoking status: Former    Packs/day: 0.80    Years: 30.00    Pack years: 24.00    Types: Cigarettes    Quit date: 09/15/2011    Years since quitting: 9.0   Smokeless tobacco: Never   Tobacco comments:    smoked 20 years; then quit 10; smoked 30 years plus  Vaping Use   Vaping Use: Never used  Substance Use Topics   Alcohol use: Yes    Comment: Every evening, 3 glasses of wine    Drug use: No         OPHTHALMIC EXAM:  Base Eye Exam     Visual Acuity (Snellen - Linear)       Right Left   Dist cc 20/25 20/30 -2   Dist ph cc NI NI         Tonometry (Tonopen, 9:55 AM)       Right Left   Pressure 15 14         Pupils       Dark Light Shape React APD   Right 2 1 Round Minimal None   Left 2 1 Round  Minimal None  Visual Fields (Counting fingers)       Left Right    Full Full         Extraocular Movement       Right Left    Full Full         Neuro/Psych     Oriented x3: Yes   Mood/Affect: Normal         Dilation     Both eyes: 1.0% Mydriacyl, 2.5% Phenylephrine @ 9:55 AM           Slit Lamp and Fundus Exam     Slit Lamp Exam       Right Left   Lids/Lashes Dermatochalasis - upper lid, Meibomian gland dysfunction Dermatochalasis - upper lid, Meibomian gland dysfunction   Conjunctiva/Sclera Melanosis, 1+ Injection Melanosis, 1+ Injection   Cornea trace Punctate epithelial erosions, arcus, high tear lake 1+Punctate epithelial erosions, arcus, high tear lake   Anterior Chamber Deep and quiet Deep and quiet   Iris Round and moderately dilated, No NVI Round and moderately dilated to 5.53mm, No NVI   Lens PC IOL in good position with open PC PC IOL in good position with open PC   Vitreous Vitreous syneresis, scattered silicone oil bubbles Vitreous syneresis, silicone oil bubbles         Fundus Exam       Right Left   Disc Trace Pallor, +cupping, Thin inferior rim mild pallor, Sharp rim, Peripapillary atrophy, Thin inferior rim   C/D Ratio 0.7 0.6   Macula Flat, Blunted foveal reflex, Drusen, RPE mottling and clumping, No heme or edema good foveal reflex, RPE clumping and atrophy, peripapillary SRF/edema SN macula - resolved, +ERM, pigmented atrophy/scarring IN macula, no heme   Vessels attenuated, Tortuous attenuated, mild tortuousity   Periphery Attached    Attached, bullous schisis cavity from 0200-0430 -- stable; no RT/RD           Refraction     Wearing Rx       Sphere Cylinder Axis Add   Right -0.75 +2.00 163 +1.75   Left +0.00 +1.25 169 +1.75            IMAGING AND PROCEDURES  Imaging and Procedures for 10/06/2020  OCT, Retina - OU - Both Eyes       Right Eye Quality was good. Central Foveal Thickness: 215. Progression  has been stable. Findings include no IRF, no SRF, retinal drusen , outer retinal atrophy, vitreomacular adhesion , normal foveal contour, intraretinal hyper-reflective material.   Left Eye Quality was good. Central Foveal Thickness: 230. Progression has improved. Findings include normal foveal contour, no IRF, retinal drusen , subretinal hyper-reflective material, pigment epithelial detachment, choroidal neovascular membrane, outer retinal atrophy, vitreomacular adhesion , no SRF (Interval resolution of persistent peripapillary SRF superior to disc, persistent PED).   Notes *Images captured and stored on drive  Diagnosis / Impression:  OD: nonexudative ARMD OS: Interval resolution of peripapillary SRF superior to disc, persistent PED  Clinical management:  See below  Abbreviations: NFP - Normal foveal profile. CME - cystoid macular edema. PED - pigment epithelial detachment. IRF - intraretinal fluid. SRF - subretinal fluid. EZ - ellipsoid zone. ERM - epiretinal membrane. ORA - outer retinal atrophy. ORT - outer retinal tubulation. SRHM - subretinal hyper-reflective material. IRHM - intraretinal hyper-reflective material      Intravitreal Injection, Pharmacologic Agent - OS - Left Eye       Time Out 10/06/2020. 10:45 AM. Confirmed correct patient, procedure, site, and patient  consented.   Anesthesia Topical anesthesia was used. Anesthetic medications included Lidocaine 2%, Proparacaine 0.5%.   Procedure Preparation included 5% betadine to ocular surface, eyelid speculum. A supplied (32g) needle was used.   Injection: 1.25 mg Bevacizumab 1.25mg /0.05ml   Route: Intravitreal, Site: Left Eye   NDC: [id number], Lot: [id number], Expiratio76n date: 11/10/2020, Waste: 0.05 mL   Post-op Post injection exam found visual acuity of at least counting fingers. The patient tolerated the procedure well. There were no complications. The patient received written and verbal post procedure care  education.            ASSESSMENT/PLAN:    ICD-10-CM   1. 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 Retinal edema  H35.81 OCT, Retina - OU - Both Eyes    6. Essential hypertension  I10     7. Hypertensive retinopathy of both eyes  H35.033     8. Pseudophakia, both eyes  Z96.1      1-3. Idiopathic polypoidal choroidal vasculopathy (PCV) OU  - Exudative age related macular degeneration, OS  - Nonexudative ARMD OD  - was seen in 2016 here by JDM  - [facility] Dr[facility] Dorette GratePostel -- received intravitreal injxns  - then transferred to Dr. Quinn Axeadiochenko -- last visit 07/2019 -- IVE OD #7 for DME  - has seen Dr. Sherryll BurgerShah twice  - FA (11.29.21) shows peripapillary CNV OS + peripheral leakage OU consistent w/ PCV  - ICG (02.28.22) shows polypoidal choroidal vasculopathy (PCV); Focal hyper cyanscence superior to disc -- cluster of polyps   - here, s/p IVA OS #1 (11.29.21), #2 (12.31.21), #3 (01.31.22), #4 (02.28.22), #5 (03.30.22), #6 (04.27.22), #7 (05.27.22), # 8 (06.24.22)  - OCT shows peripapillary CNV w/  interval resolution of overlying SRF OS at 4 wks; OD without exudative disease  - BCVA 20/25 OD; 20/30 OS  - recommend IVA OS #9 today, 07.25.22 -- maintenance w/ ext to 5 wks  - pt wishes to proceed  - RBA of procedure discussed, questions answered  - informed consent obtained and signed  - see procedure note - informed consent obtained, signed and scanned, 11.29.21 (IVA OS) - see procedure note  - f/u in 5 wks -- DFE/OCT--scan through schisis OS (0200-0430)  4,5. Retinoschisis OS  - focal, bullous schisis cavity  in temporal periphery 2-430  - widefield OCT confirms schisis w/o RD  - stable  - continue monitoring for now  - may need laser if retinoschisis progresses posteriorly  6,7. Hypertensive retinopathy OU - discussed importance of tight BP control - monitor  8. Pseudophakia OU  - s/p CE/IOL (Dr. Randon GoldsmithLyles)  - IOL in good position, doing well - monitor   Ophthalmic Meds Ordered this visit:  Meds ordered this encounter  Medications   Bevacizumab (AVASTIN) SOLN 1.25 mg      Return in about 5 weeks (around 11/10/2020) for ex ARMD / PCV OS - Dilated Exam, OCT, Possible Injxn.  There are no Patient Instructions on file for this visit.   This document serves as a record of services personally performed by Karie ChimeraBrian G. Carmie Lanpher, MD, PhD. It was  created on their behalf by Annalee Genta, COMT. The creation of this record is the provider's dictation and/or activities during the visit.  Electronically signed by: Annalee Genta, COMT 10/06/20 4:15 PM  Karie Chimera, M.D., Ph.D. Diseases & Surgery of the Retina and Vitreous [facility]  I have reviewed the above documentation for accuracy and completeness, and I agree with the above. Karie Chimera, M.D., Ph.D. 10/06/20 4:15 PM   Abbreviations: M myopia (nearsighted); A astigmatism; H hyperopia (farsighted); P presbyopia; Mrx spectacle prescription;  CTL contact lenses; OD right eye; OS left eye; OU both eyes  XT exotropia; ET esotropia; PEK punctate epithelial keratitis; PEE punctate epithelial erosions; DES dry eye syndrome; MGD meibomian gland dysfunction; ATs artificial tears; PFAT's preservative free artificial tears; NSC nuclear sclerotic cataract; PSC posterior subcapsular cataract; ERM epi-retinal membrane; PVD posterior vitreous detachment; RD retinal detachment; DM diabetes mellitus; DR diabetic retinopathy; NPDR non-proliferative diabetic retinopathy; PDR proliferative diabetic retinopathy; CSME clinically significant  macular edema; DME diabetic macular edema; dbh dot blot hemorrhages; CWS cotton wool spot; POAG primary open angle glaucoma; C/D cup-to-disc ratio; HVF humphrey visual field; GVF goldmann visual field; OCT optical coherence tomography; IOP intraocular pressure; BRVO Branch retinal vein occlusion; CRVO central retinal vein occlusion; CRAO central retinal artery occlusion; BRAO branch retinal artery occlusion; RT retinal tear; SB scleral buckle; PPV pars plana vitrectomy; VH Vitreous hemorrhage; PRP panretinal laser photocoagulation; IVK intravitreal kenalog; VMT vitreomacular traction; MH Macular hole;  NVD neovascularization of the disc; NVE neovascularization elsewhere; AREDS age related eye disease study; ARMD age related macular degeneration; POAG primary open angle glaucoma; EBMD epithelial/anterior basement membrane dystrophy; ACIOL anterior chamber intraocular lens; IOL intraocular lens; PCIOL posterior chamber intraocular lens; Phaco/IOL phacoemulsification with intraocular lens placement; PRK photorefractive keratectomy; LASIK laser assisted in situ keratomileusis; HTN hypertension; DM diabetes mellitus; COPD chronic obstructive pulmonary disease

## 2020-10-06 ENCOUNTER — Other Ambulatory Visit: Payer: Self-pay

## 2020-10-06 ENCOUNTER — Ambulatory Visit (INDEPENDENT_AMBULATORY_CARE_PROVIDER_SITE_OTHER): Payer: Medicare Other | Admitting: Ophthalmology

## 2020-10-06 ENCOUNTER — Encounter (INDEPENDENT_AMBULATORY_CARE_PROVIDER_SITE_OTHER): Payer: Self-pay | Admitting: Ophthalmology

## 2020-10-06 DIAGNOSIS — H3581 Retinal edema: Secondary | ICD-10-CM

## 2020-10-06 DIAGNOSIS — H35033 Hypertensive retinopathy, bilateral: Secondary | ICD-10-CM

## 2020-10-06 DIAGNOSIS — Z961 Presence of intraocular lens: Secondary | ICD-10-CM

## 2020-10-06 DIAGNOSIS — H318 Other specified disorders of choroid: Secondary | ICD-10-CM | POA: Diagnosis not present

## 2020-10-06 DIAGNOSIS — H353221 Exudative age-related macular degeneration, left eye, with active choroidal neovascularization: Secondary | ICD-10-CM | POA: Diagnosis not present

## 2020-10-06 DIAGNOSIS — H353112 Nonexudative age-related macular degeneration, right eye, intermediate dry stage: Secondary | ICD-10-CM | POA: Diagnosis not present

## 2020-10-06 DIAGNOSIS — H33102 Unspecified retinoschisis, left eye: Secondary | ICD-10-CM

## 2020-10-06 DIAGNOSIS — I10 Essential (primary) hypertension: Secondary | ICD-10-CM

## 2020-10-06 MED ORDER — BEVACIZUMAB CHEMO INJECTION 1.25MG/0.05ML SYRINGE FOR KALEIDOSCOPE
1.2500 mg | INTRAVITREAL | Status: AC | PRN
  Administered 2020-10-06: 1.25 mg via INTRAVITREAL

## 2020-11-07 ENCOUNTER — Other Ambulatory Visit: Payer: Self-pay

## 2020-11-07 ENCOUNTER — Ambulatory Visit: Payer: Medicare Other | Admitting: Cardiology

## 2020-11-07 ENCOUNTER — Encounter: Payer: Self-pay | Admitting: Cardiology

## 2020-11-07 VITALS — BP 171/66 | HR 62 | Temp 96.7°F | Resp 16 | Ht 64.0 in | Wt 261.8 lb

## 2020-11-07 DIAGNOSIS — R0683 Snoring: Secondary | ICD-10-CM

## 2020-11-07 DIAGNOSIS — R0609 Other forms of dyspnea: Secondary | ICD-10-CM

## 2020-11-07 DIAGNOSIS — E785 Hyperlipidemia, unspecified: Secondary | ICD-10-CM

## 2020-11-07 DIAGNOSIS — I10 Essential (primary) hypertension: Secondary | ICD-10-CM

## 2020-11-07 DIAGNOSIS — I2510 Atherosclerotic heart disease of native coronary artery without angina pectoris: Secondary | ICD-10-CM

## 2020-11-07 DIAGNOSIS — E66813 Obesity, class 3: Secondary | ICD-10-CM

## 2020-11-07 DIAGNOSIS — Z87891 Personal history of nicotine dependence: Secondary | ICD-10-CM

## 2020-11-07 DIAGNOSIS — R072 Precordial pain: Secondary | ICD-10-CM

## 2020-11-07 DIAGNOSIS — G4733 Obstructive sleep apnea (adult) (pediatric): Secondary | ICD-10-CM

## 2020-11-07 DIAGNOSIS — E1165 Type 2 diabetes mellitus with hyperglycemia: Secondary | ICD-10-CM

## 2020-11-07 MED ORDER — METOPROLOL SUCCINATE ER 25 MG PO TB24: 25.0000 mg | ORAL_TABLET | Freq: Every morning | ORAL | 0 refills | Status: DC

## 2020-11-07 NOTE — Progress Notes (Signed)
Date:  11/07/2020   ID:  Kylie Wilson, DOB 05-27-1950, MRN [id number]  PCP:  Truett Perna, MD  Cardiologist:  Tessa Lerner, DO, [facility] (established care 07/13/2019) Former Cardiology Providers: Dr. Chilton Si and and Corine Shelter PA-C  Date: 11/07/20 Last Office Visit: 01/14/2020   Chief Complaint  Patient presents with   Coronary Artery Disease   Follow-up    HPI  Kylie Wilson is a 70 y.o. female whose past medical history and cardiovascular risk factors include: Obesity due to excess calories, OSA not on CPAP, dyslipidemia, hypertension, non-insulin-dependent diabetes mellitus type 2, moderate coronary artery calcification, mild nonobstructive CAD per coronary CTA, postmenopausal female, advanced age, and former smoker.  Here with a chief complaint of "58-month follow-up history of coronary calcification."  Patient underwent a CT of the chest with contrast back in December 2017 and was noted to have coronary artery calcification.  She subsequently underwent nuclear stress test by another provider which was reported to be normal.  At the last office visit she was recommended to follow-up in 6 months.  Since last office visit patient had a coronary CTA at [facility] health results reviewed in Care Everywhere and noted below for further reference.  She is noted to have moderate coronary artery calcification and mild nonobstructive CAD.  Patient is here for 39-month follow-up.  Patient states that at times she does have chest pain and shortness of breath.  The chest pain occurs 2 or 3 times per week, lasting for few minutes, mild discomfort over the left sternal border, intensity 2 out of 10, described as ache-like sensation, no improving or worsening factors, self-limited.  The pain is not brought on by effort related activities and does not resolve with rest.  Shortness of breath occurs on a daily basis, predominantly with effort related activities, improves with resting, denies  orthopnea, paroxysmal nocturnal dyspnea lower extremity swelling.  Patient states that she is currently being seeing a pulmonary medicine, also had GI evaluation was noted to have a decent sized hiatal hernia and continues to have excessive burping.  Patient's blood pressure at today's office visit is not well controlled as she did not take her morning blood pressure medications and yesterday had a very salt rich diet.  Patient states that she recently started using BC powder for pain control for osteoarthritis which asked helped her symptoms but has noticed elevated blood pressures.  She has stopped using aspirin 81 mg p.o. daily since initiation of BC powder.  ALLERGIES: Allergies  Allergen Reactions   Duloxetine Other (See Comments)    Pt says it also caused high pressure.   Lisinopril Other (See Comments)    cough    MEDICATION LIST PRIOR TO VISIT: Current Meds  Medication Sig   ACCU-CHEK AVIVA PLUS test strip USE TO TEST BLOOD SUGARS DAILY.   amlodipine-olmesartan (AZOR) 10-20 MG tablet Take 1 tablet by mouth daily.   Ascorbic Acid (VITAMIN C) 1000 MG tablet Take 1,000 mg by mouth daily.   Blood Glucose Monitoring Suppl (GLUCOCOM BLOOD GLUCOSE MONITOR) DEVI One Touch Verio Meter. FSBS BID. E11.319   brimonidine (ALPHAGAN) 0.2 % ophthalmic solution Place 1 drop into both eyes 2 times daily.   Cholecalciferol (VITAMIN D3 PO) Take 5,000 Units by mouth daily.   dorzolamide-timolol (COSOPT) 22.3-6.8 MG/ML ophthalmic solution Place 1 drop into both eyes 2 (two) times daily.   empagliflozin (JARDIANCE) 25 MG TABS tablet Take 1 tablet by mouth daily.   ferrous sulfate 325 (65 FE) MG  tablet Take by mouth.   hydrochlorothiazide (HYDRODIURIL) 25 MG tablet TAKE 1 TABLET (25 MG TOTAL) BY MOUTH IN THE MORNING.   Lancets (ONETOUCH DELICA PLUS LANCET33G) MISC Apply topically.   LINZESS 72 MCG capsule Take 72 mcg by mouth daily as needed (abdominal cramping).    metFORMIN (GLUCOPHAGE) 500 MG tablet  Take 1,000 mg by mouth 2 (two) times daily with a meal.    metoprolol succinate (TOPROL XL) 25 MG 24 hr tablet Take 1 tablet (25 mg total) by mouth every morning. Hold if systolic blood pressure (top number) less than 100 mmHg or pulse less than 60 bpm.   Netarsudil-Latanoprost (ROCKLATAN) 0.02-0.005 % SOLN Apply to eye.   pantoprazole (PROTONIX) 40 MG tablet Take 40 mg by mouth daily.   rosuvastatin (CRESTOR) 10 MG tablet Take 1 tablet (10 mg total) by mouth at bedtime for 90 doses.   [DISCONTINUED] losartan (COZAAR) 25 MG tablet Take 1 tablet by mouth daily.     PAST MEDICAL HISTORY: Past Medical History:  Diagnosis Date   Arthritis    Barrett's esophagus    Burning pain    Cervical radiculopathy    Cervical spondylosis    Coronary artery calcification    DDD (degenerative disc disease), cervical    Diabetes mellitus    type 2    GERD (gastroesophageal reflux disease)    Hyperlipidemia    Hypertension    Hypertensive retinopathy    OU   Macular degeneration    Dry OD; Wet OS   Obesity    Rectocele    Sleep apnea    does not tolerate cpap   Uterine prolapse     PAST SURGICAL HISTORY: Past Surgical History:  Procedure Laterality Date   BREAST BIOPSY  1980   left   BREAST EXCISIONAL BIOPSY Left    Bening 1980's   CATARACT EXTRACTION Bilateral    Dr. Randon Goldsmith   EYE SURGERY Bilateral    Cat Sx - Dr. Randon Goldsmith   SCAR REVISION  2001   both legs   TOE SURGERY  1981   4th and 5th toe right foot   TOE SURGERY  2002   great toe left   TONSILLECTOMY     TUBAL LIGATION      FAMILY HISTORY: The patient family history includes Breast cancer in her paternal aunt; Heart disease in her brother and father; Pancreatic cancer in her father; Stroke in her mother.  SOCIAL HISTORY:  The patient  reports that she quit smoking about 9 years ago. Her smoking use included cigarettes. She has a 24.00 pack-year smoking history. She has never used smokeless tobacco. She reports current alcohol  use. She reports that she does not use drugs.  REVIEW OF SYSTEMS: Review of Systems  Constitutional: Negative for chills, fever and malaise/fatigue.  HENT:  Negative for hoarse voice and nosebleeds.   Eyes:  Negative for discharge, double vision and pain.  Cardiovascular:  Positive for chest pain and dyspnea on exertion. Negative for claudication, leg swelling, near-syncope, orthopnea, palpitations, paroxysmal nocturnal dyspnea and syncope.  Respiratory:  Positive for shortness of breath (chronic). Negative for hemoptysis.   Musculoskeletal:  Negative for muscle cramps and myalgias.  Gastrointestinal:  Negative for abdominal pain, constipation, diarrhea, hematemesis, hematochezia, melena, nausea and vomiting.  Neurological:  Negative for dizziness and light-headedness.   PHYSICAL EXAM: Vitals with BMI 11/07/2020 11/07/2020 01/14/2020  Height - 5\' 4"  5\' 4"   Weight - 261 lbs 13 oz 263 lbs  BMI - 44.92  45.12  Systolic 171 164 637  Diastolic 66 76 83  Pulse 62 64 67   CONSTITUTIONAL: Well-developed and well-nourished. No acute distress.  SKIN: Skin is warm and dry. No rash noted. No cyanosis. No pallor. No jaundice HEAD: Normocephalic and atraumatic.  EYES: No scleral icterus MOUTH/THROAT: Moist oral membranes.  NECK: No JVD present. No thyromegaly noted. No carotid bruits  LYMPHATIC: No visible cervical adenopathy.  CHEST Normal respiratory effort. No intercostal retractions  LUNGS: Clear to auscultation bilaterally no stridor. No wheezes. No rales.  CARDIOVASCULAR: Regular rate and rhythm, positive S1-S2, no murmurs rubs or gallops appreciated. ABDOMINAL: Obese, soft, nontender, nondistended, positive bowel sounds all 4 quadrants. No apparent ascites.  EXTREMITIES: No peripheral edema  HEMATOLOGIC: No significant bruising NEUROLOGIC: Oriented to person, place, and time. Nonfocal. Normal muscle tone.  PSYCHIATRIC: Normal mood and affect. Normal behavior. Cooperative  CARDIAC  DATABASE: EKG: 11/07/2020: Normal sinus rhythm, 67 bpm, normal axis, occasional PACs, without underlying ischemia or injury pattern.   Echocardiogram: 07/23/2019:  Normal LV systolic function with visual EF 65-70%. Left ventricle cavity is normal in size. Mild concentric hypertrophy of the left ventricle. Normal global wall motion. Normal diastolic filling pattern, normal LAP. Calculated EF 66%.  Mild mitral valve leaflet thickening. Normal mitral valve leaflet mobility. No evidence of mitral stenosis. Mild (Grade I) mitral regurgitation.  No other significant valvular abnormalities. No prior study for comparison.  Stress Testing: MPI 04/2019:Nuclear stress EF: 70%. The left ventricular ejection fraction is hyperdynamic (>65%). There was no ST segment deviation noted during stress. This is a low risk study. The study is normal.  Heart Catheterization: None  Coronary CTA performed at [facility]: 07/29/2020 records available via Care Everywhere 1. Three-vessel predominantly calcified atherosclerotic plaque with 25-49% stenosis in the distal left anterior descending and within a prominent  obtuse marginal branch. CAD-RADS 2.  2. Calcium Score: 189. The exact is percentile calculated to be 71%; this means 70% population has a lower calcium score and 29% population has a  higher calcium score.  3. CTFFR Analysis: CT FFR will not be performed for this study.   LABORATORY DATA: CBC Latest Ref Rng & Units 04/25/2019 08/22/2016 02/19/2016  WBC 4.0 - 10.5 K/uL 5.5 6.8 6.3  Hemoglobin 12.0 - 15.0 g/dL 85.8 11.9(L) 12.4  Hematocrit 36.0 - 46.0 % 38.6 37.4 38.5  Platelets 150 - 400 K/uL 191 198 191    CMP Latest Ref Rng & Units 01/30/2020 04/25/2019 08/22/2016  Glucose 65 - 99 mg/dL 850(Y) 774(J) 287(O)  BUN 8 - 27 mg/dL 15 13 10   Creatinine 0.57 - 1.00 mg/dL 6.76 7.20  Sodium 134 - 144 mmol/L 139 139 137  Potassium 3.5 - 5.2 mmol/L 4.5 3.9 3.8  Chloride 96 - 106 mmol/L 102 105 104  CO2 20 - 29  mmol/L 19(L) 26 24  Calcium 8.7 - 10.3 mg/dL 9.6 9.0 9.47)  Total Protein 6.5 - 8.1 g/dL - 7.6 -  Total Bilirubin 0.3 - 1.2 mg/dL - 0.6 -  Alkaline Phos 38 - 126 U/L - 102 -  AST 15 - 41 U/L - 18 -  ALT 0 - 44 U/L - 22 -    Lipid Panel     Component Value Date/Time   CHOL 197 06/04/2016 1114   TRIG 73.0 06/04/2016 1114   HDL 56.00 06/04/2016 1114   CHOLHDL 4 06/04/2016 1114   VLDL 14.6 06/04/2016 1114   LDLCALC 126 (H) 06/04/2016 1114    Lab Results  Component Value Date   HGBA1C 9.2 (H) 06/04/2016   HGBA1C 7.9 (H) 02/17/2016   HGBA1C 7.3 07/24/2015   No components found for: NTPROBNP Lab Results  Component Value Date   TSH 1.290 05/22/2019   TSH 1.35 07/24/2015   Cardiac Panel (last 3 results) No results for input(s): CKTOTAL, CKMB, TROPONINIHS, RELINDX in the last 72 hours.  TSH No results for input(s): TSH in the last 8760 hours.  IMPRESSION:    ICD-10-CM   1. Precordial pain  R07.2 metoprolol succinate (TOPROL XL) 25 MG 24 hr tablet    2. Coronary artery calcification of native artery  I25.10 EKG 12-Lead   I25.84 metoprolol succinate (TOPROL XL) 25 MG 24 hr tablet    Ambulatory referral to Sleep Studies    3. Dyslipidemia, goal LDL below 70  E78.5     4. Essential hypertension  I10     5. Obstructive sleep apnea syndrome  G47.33 Ambulatory referral to Sleep Studies    6. Type 2 diabetes mellitus with hyperglycemia, without long-term current use of insulin (HCC)  E11.65     7. Former smoker  Z87.891     8. Class 3 severe obesity due to excess calories with serious comorbidity and body mass index (BMI) of 45.0 to 49.9 in adult (HCC)  E66.01    Z68.42     9. Snoring  R06.83 Ambulatory referral to Sleep Studies       RECOMMENDATIONS: Kylie EaringJanice Robinson Wymer is a 70 y.o. female whose past medical history and cardiac risk factors include: Obesity due to excess calories, OSA not on CPAP, dyslipidemia, hypertension, non-insulin-dependent diabetes mellitus  type 2, coronary artery calcification, postmenopausal female, advanced age, and former smoker.  Precordial pain: Based on symptoms appears to be noncardiac. Nuclear stress test in 2021-low risk study [facility] notes mild nonobstructive CAD involving the distal LAD distribution with moderate coronary artery calcification at total CAC of 189 AU. Would like to hold off on additional cardiovascular testing at this time. We will focus on improving blood pressures and increasing antianginal therapy. Start Toprol-XL 25 mg p.o. daily. ECG nonischemic.  Dyspnea on exertion: Multifactorial: Uncontrolled hypertension, deconditioning secondary to obesity, obesity hypoventilatory syndrome, etc. Blood pressure management as discussed below Patient is currently being worked up as she is found to have lung findings on prior studies, follow-up with pulmonary medicine Patient has undergone a very appropriate ischemic work-up including nuclear stress test and coronary CTA results noted above and reviewed as part of this decision making process  Coronary artery calcification: Total CAC 189 AU Patient has discontinued aspirin as she is taking BC powder for arthritic pain which has a component of aspirin within it. Continue statin therapy.  Benign essential hypertension: Not well controlled Educated on the importance of low-salt diet We will enroll her into principal care management for further titration of ambulatory blood pressure monitoring Known history of OSA but currently not using CPAP.  Educated on the importance of addressing her underlying sleep apnea.  Patient is willing to proceed with sleep medicine evaluation and is requesting a referral We will refer her to Dr. Allie Dimmersborn for sleep apnea evaluation  Obstructive sleep apnea not on CPAP: See above  Dyslipidemia: Continue rosuvastatin. Does not endorse myalgias. Currently managed by primary care  provider.   FINAL MEDICATION LIST END OF ENCOUNTER: Meds ordered this encounter  Medications   metoprolol succinate (TOPROL XL) 25 MG 24 hr tablet    Sig: Take 1  tablet (25 mg total) by mouth every morning. Hold if systolic blood pressure (top number) less than 100 mmHg or pulse less than 60 bpm.    Dispense:  90 tablet    Refill:  0     Current Outpatient Medications:    ACCU-CHEK AVIVA PLUS test strip, USE TO TEST BLOOD SUGARS DAILY., Disp: 100 each, Rfl: 1   amlodipine-olmesartan (AZOR) 10-20 MG tablet, Take 1 tablet by mouth daily., Disp: , Rfl:    Ascorbic Acid (VITAMIN C) 1000 MG tablet, Take 1,000 mg by mouth daily., Disp: , Rfl:    Blood Glucose Monitoring Suppl (GLUCOCOM BLOOD GLUCOSE MONITOR) DEVI, One Touch Verio Meter. FSBS BID. E11.319, Disp: , Rfl:    brimonidine (ALPHAGAN) 0.2 % ophthalmic solution, Place 1 drop into both eyes 2 times daily., Disp: , Rfl:    Cholecalciferol (VITAMIN D3 PO), Take 5,000 Units by mouth daily., Disp: , Rfl:    dorzolamide-timolol (COSOPT) 22.3-6.8 MG/ML ophthalmic solution, Place 1 drop into both eyes 2 (two) times daily., Disp: , Rfl:    empagliflozin (JARDIANCE) 25 MG TABS tablet, Take 1 tablet by mouth daily., Disp: , Rfl:    ferrous sulfate 325 (65 FE) MG tablet, Take by mouth., Disp: , Rfl:    hydrochlorothiazide (HYDRODIURIL) 25 MG tablet, TAKE 1 TABLET (25 MG TOTAL) BY MOUTH IN THE MORNING., Disp: 90 tablet, Rfl: 0   Lancets (ONETOUCH DELICA PLUS LANCET33G) MISC, Apply topically., Disp: , Rfl:    LINZESS 72 MCG capsule, Take 72 mcg by mouth daily as needed (abdominal cramping). , Disp: , Rfl:    metFORMIN (GLUCOPHAGE) 500 MG tablet, Take 1,000 mg by mouth 2 (two) times daily with a meal. , Disp: , Rfl:    metoprolol succinate (TOPROL XL) 25 MG 24 hr tablet, Take 1 tablet (25 mg total) by mouth every morning. Hold if systolic blood pressure (top number) less than 100 mmHg or pulse less than 60 bpm., Disp: 90 tablet, Rfl: 0    Netarsudil-Latanoprost (ROCKLATAN) 0.02-0.005 % SOLN, Apply to eye., Disp: , Rfl:    pantoprazole (PROTONIX) 40 MG tablet, Take 40 mg by mouth daily., Disp: , Rfl:    rosuvastatin (CRESTOR) 10 MG tablet, Take 1 tablet (10 mg total) by mouth at bedtime for 90 doses., Disp: 90 tablet, Rfl: 0  Orders Placed This Encounter  Procedures   Ambulatory referral to Sleep Studies   EKG 12-Lead   --Continue cardiac medications as reconciled in final medication list. --Return in about 3 months (around 02/07/2021) for Reevaluation of, Chest pain, BP. Or sooner if needed. --Continue follow-up with your primary care physician regarding the management of your other chronic comorbid conditions.  Patient's questions and concerns were addressed to her satisfaction. She voices understanding of the instructions provided during this encounter.   This note was created using a voice recognition software as a result there may be grammatical errors inadvertently enclosed that do not reflect the nature of this encounter. Every attempt is made to correct such errors.  Total time spent: 37 minutes.  Discussed her symptoms of chest pain and shortness of breath, medications reconciled and prescriptions provided, discussed disease management, referral to sleep medicine, reviewed outside records from [facility] available in Care Everywhere including coronary CTA findings noted above, coordination of care.  Tessa Lerner, [place], [facility]  Pager: [phone] Office: [phone]

## 2020-11-10 ENCOUNTER — Encounter (INDEPENDENT_AMBULATORY_CARE_PROVIDER_SITE_OTHER): Payer: Medicare Other | Admitting: Ophthalmology

## 2020-11-10 ENCOUNTER — Other Ambulatory Visit: Payer: Self-pay

## 2020-11-10 DIAGNOSIS — H353112 Nonexudative age-related macular degeneration, right eye, intermediate dry stage: Secondary | ICD-10-CM

## 2020-11-10 DIAGNOSIS — H35033 Hypertensive retinopathy, bilateral: Secondary | ICD-10-CM | POA: Diagnosis not present

## 2020-11-10 DIAGNOSIS — H33102 Unspecified retinoschisis, left eye: Secondary | ICD-10-CM

## 2020-11-10 DIAGNOSIS — H43813 Vitreous degeneration, bilateral: Secondary | ICD-10-CM

## 2020-11-10 DIAGNOSIS — Z961 Presence of intraocular lens: Secondary | ICD-10-CM

## 2020-11-10 DIAGNOSIS — H318 Other specified disorders of choroid: Secondary | ICD-10-CM

## 2020-11-10 DIAGNOSIS — I10 Essential (primary) hypertension: Secondary | ICD-10-CM | POA: Diagnosis not present

## 2020-11-10 DIAGNOSIS — H3581 Retinal edema: Secondary | ICD-10-CM

## 2020-11-10 DIAGNOSIS — H353221 Exudative age-related macular degeneration, left eye, with active choroidal neovascularization: Secondary | ICD-10-CM | POA: Diagnosis not present

## 2020-12-02 ENCOUNTER — Other Ambulatory Visit: Payer: Self-pay | Admitting: Cardiology

## 2020-12-02 DIAGNOSIS — I2510 Atherosclerotic heart disease of native coronary artery without angina pectoris: Secondary | ICD-10-CM

## 2020-12-02 DIAGNOSIS — R072 Precordial pain: Secondary | ICD-10-CM

## 2020-12-04 ENCOUNTER — Other Ambulatory Visit: Payer: Self-pay | Admitting: Cardiology

## 2020-12-04 DIAGNOSIS — I10 Essential (primary) hypertension: Secondary | ICD-10-CM

## 2020-12-04 NOTE — Progress Notes (Signed)
[facility] - Clinic Note  12/08/2020     CHIEF COMPLAINT Patient presents for Retina Follow Up   HISTORY OF PRESENT ILLNESS: Kylie Wilson is a 70 y.o. female who presents to the clinic today for:   HPI     Retina Follow Up   Patient presents with  Diabetic Retinopathy.  In left eye.  This started weeks ago.  Severity is moderate.  Duration of weeks.  Since onset it is stable.  I, the attending physician,  performed the HPI with the patient and updated documentation appropriately.        Comments   BS: 162 this AM A1c: 7.5 Pt states vision seems to be fluctuating as BS are fluctuating.  Pt states BS has been high the last few weeks.  Pt denies eye pain or discomfort.  Pt denies any new or worsening floaters or fol OU.      Last edited by Rennis Chris, MD on 12/08/2020 12:22 PM.      Referring physician: Truett Perna, MD [id number] [place],  [place] [address]  HISTORICAL INFORMATION:   Selected notes from the MEDICAL RECORD NUMBER Previous [name] pt (2016) self-referred for possible RD LEE:  Ocular Hx- PCV diagnosed by Dr. Dorette Grate at [facility]; followed w/ Dr. Rhina Brackett for DM PMH-    CURRENT MEDICATIONS: Current Outpatient Medications (Ophthalmic Drugs)  Medication Sig   brimonidine (ALPHAGAN) 0.2 % ophthalmic solution Place 1 drop into both eyes 2 times daily.   dorzolamide-timolol (COSOPT) 22.3-6.8 MG/ML ophthalmic solution Place 1 drop into both eyes 2 (two) times daily.   Netarsudil-Latanoprost (ROCKLATAN) 0.02-0.005 % SOLN Apply to eye.   No current facility-administered medications for this visit. (Ophthalmic Drugs)   Current Outpatient Medications (Other)  Medication Sig   ACCU-CHEK AVIVA PLUS test strip USE TO TEST BLOOD SUGARS DAILY.   amlodipine-olmesartan (AZOR) 10-20 MG tablet Take 1 tablet by mouth daily.   Ascorbic Acid (VITAMIN C) 1000 MG tablet Take 1,000 mg by mouth daily.   Blood Glucose Monitoring  Suppl (GLUCOCOM BLOOD GLUCOSE MONITOR) DEVI One Touch Verio Meter. FSBS BID. E11.319   Cholecalciferol (VITAMIN D3 PO) Take 5,000 Units by mouth daily.   empagliflozin (JARDIANCE) 25 MG TABS tablet Take 1 tablet by mouth daily.   ferrous sulfate 325 (65 FE) MG tablet Take by mouth.   hydrochlorothiazide (HYDRODIURIL) 25 MG tablet TAKE 1 TABLET BY MOUTH IN THE MORNING   Lancets (ONETOUCH DELICA PLUS LANCET33G) MISC Apply topically.   LINZESS 72 MCG capsule Take 72 mcg by mouth daily as needed (abdominal cramping).    metFORMIN (GLUCOPHAGE) 500 MG tablet Take 1,000 mg by mouth 2 (two) times daily with a meal.    metoprolol succinate (TOPROL-XL) 25 MG 24 hr tablet TAKE 1 TABLET (25 MG TOTAL) BY MOUTH EVERY MORNING. HOLD IF SYSTOLIC BLOOD PRESSURE (TOP NUMBER) LESS THAN 100 MMHG OR PULSE LESS THAN 60 BPM.   pantoprazole (PROTONIX) 40 MG tablet Take 40 mg by mouth daily.   rosuvastatin (CRESTOR) 10 MG tablet Take 1 tablet (10 mg total) by mouth at bedtime for 90 doses.   No current facility-administered medications for this visit. (Other)   REVIEW OF SYSTEMS: ROS   Positive for: Gastrointestinal, Neurological, Musculoskeletal, Endocrine, Cardiovascular, Eyes, Respiratory Negative for: Constitutional, Skin, Genitourinary, HENT, Psychiatric, Allergic/Imm, Heme/Lymph Last edited by Corrinne Eagle on 12/08/2020  9:36 AM.     ALLERGIES Allergies  Allergen Reactions   Duloxetine Other (See Comments)  Pt says it also caused high pressure.   Lisinopril Other (See Comments)    cough    PAST MEDICAL HISTORY Past Medical History:  Diagnosis Date   Arthritis    Barrett's esophagus    Burning pain    Cervical radiculopathy    Cervical spondylosis    Coronary artery calcification    DDD (degenerative disc disease), cervical    Diabetes mellitus    type 2    GERD (gastroesophageal reflux disease)    Hyperlipidemia    Hypertension    Hypertensive retinopathy    OU   Macular degeneration     Dry OD; Wet OS   Obesity    Rectocele    Sleep apnea    does not tolerate cpap   Uterine prolapse    Past Surgical History:  Procedure Laterality Date   BREAST BIOPSY  1980   left   BREAST EXCISIONAL BIOPSY Left    Bening 1980's   CATARACT EXTRACTION Bilateral    Dr. Randon Goldsmith   EYE SURGERY Bilateral    Cat Sx - Dr. Randon Goldsmith   SCAR REVISION  2001   both legs   TOE SURGERY  1981   4th and 5th toe right foot   TOE SURGERY  2002   great toe left   TONSILLECTOMY     TUBAL LIGATION      FAMILY HISTORY Family History  Problem Relation Age of Onset   Heart disease Father    Pancreatic cancer Father    Breast cancer Paternal Aunt    Stroke Mother    Heart disease Brother    Colon cancer Neg Hx    Stomach cancer Neg Hx    Colon polyps Neg Hx    Rectal cancer Neg Hx     SOCIAL HISTORY Social History   Tobacco Use   Smoking status: Former    Packs/day: 0.80    Years: 30.00    Pack years: 24.00    Types: Cigarettes    Quit date: 09/15/2011    Years since quitting: 9.2   Smokeless tobacco: Never   Tobacco comments:    smoked 20 years; then quit 10; smoked 30 years plus  Vaping Use   Vaping Use: Never used  Substance Use Topics   Alcohol use: Yes    Comment: Every evening, 3 glasses of wine    Drug use: No       OPHTHALMIC EXAM:  Base Eye Exam     Visual Acuity (Snellen - Linear)       Right Left   Dist cc 20/20 -2 20/25 -2    Correction: Glasses         Tonometry (Tonopen, 9:41 AM)       Right Left   Pressure 14 16         Pupils       Dark Light Shape React APD   Right 2 1 Round Minimal 0   Left 2 1 Round Minimal 0         Visual Fields       Left Right    Full Full         Extraocular Movement       Right Left    Full Full         Neuro/Psych     Oriented x3: Yes   Mood/Affect: Normal         Dilation     Both eyes: 1.0% Mydriacyl, 2.5% Phenylephrine @  9:41 AM           Slit Lamp and Fundus Exam     Slit  Lamp Exam       Right Left   Lids/Lashes Dermatochalasis - upper lid, Meibomian gland dysfunction Dermatochalasis - upper lid, Meibomian gland dysfunction   Conjunctiva/Sclera Melanosis, 1+ Injection Melanosis, 1+ Injection   Cornea trace Punctate epithelial erosions, arcus, high tear lake 1+Punctate epithelial erosions, arcus, high tear lake   Anterior Chamber Deep and quiet Deep and quiet   Iris Round and moderately dilated, No NVI Round and moderately dilated to 5.10mm, No NVI   Lens PC IOL in good position with open PC PC IOL in good position with open PC   Vitreous Vitreous syneresis, scattered silicone oil bubbles Vitreous syneresis, silicone oil bubbles         Fundus Exam       Right Left   Disc Trace Pallor, +cupping, Thin inferior rim mild pallor, Sharp rim, Peripapillary atrophy, Thin inferior rim   C/D Ratio 0.7 0.6   Macula Flat, Blunted foveal reflex, Drusen, RPE mottling and clumping, No heme or edema good foveal reflex, RPE clumping and atrophy, peripapillary SRF/edema SN macula - resolved, +ERM, pigmented atrophy/scarring IN macula, no heme   Vessels attenuated, Tortuous attenuated, mild tortuousity   Periphery Attached    Attached, bullous schisis cavity from 0200-0430 -- stable; no RT/RD           Refraction     Wearing Rx       Sphere Cylinder Axis Add   Right -0.75 +2.00 163 +1.75   Left +0.00 +1.25 169 +1.75            IMAGING AND PROCEDURES  Imaging and Procedures for 12/08/2020  OCT, Retina - OU - Both Eyes       Right Eye Quality was good. Central Foveal Thickness: 215. Progression has been stable. Findings include no IRF, no SRF, retinal drusen , outer retinal atrophy, vitreomacular adhesion , normal foveal contour, intraretinal hyper-reflective material.   Left Eye Quality was good. Central Foveal Thickness: 226. Progression has improved. Findings include normal foveal contour, no IRF, retinal drusen , subretinal hyper-reflective material,  pigment epithelial detachment, choroidal neovascular membrane, outer retinal atrophy, vitreomacular adhesion , no SRF (Interval resolution of persistent peripapillary SRF superior to disc, persistent PED).   Notes *Images captured and stored on drive  Diagnosis / Impression:  OD: nonexudative ARMD OS: Interval resolution of peripapillary SRF superior to disc, persistent PED  Clinical management:  See below  Abbreviations: NFP - Normal foveal profile. CME - cystoid macular edema. PED - pigment epithelial detachment. IRF - intraretinal fluid. SRF - subretinal fluid. EZ - ellipsoid zone. ERM - epiretinal membrane. ORA - outer retinal atrophy. ORT - outer retinal tubulation. SRHM - subretinal hyper-reflective material. IRHM - intraretinal hyper-reflective material      Intravitreal Injection, Pharmacologic Agent - OS - Left Eye       Time Out 12/08/2020. 10:57 AM. Confirmed correct patient, procedure, site, and patient consented.   Anesthesia Topical anesthesia was used. Anesthetic medications included Lidocaine 2%, Proparacaine 0.5%.   Procedure Preparation included 5% betadine to ocular surface, eyelid speculum. A supplied (32g) needle was used.   Injection: 1.25 mg Bevacizumab 1.25mg /0.60ml   Route: Intravitreal, Site: Left Eye   NDC: [id number], Lot: [id number], Expiration date: 02/04/2021, Waste: 0.05 mL   Post-op Post injection exam found visual acuity of at least counting fingers. The patient tolerated the procedure well.  There were no complications. The patient received written and verbal post procedure care education.            ASSESSMENT/PLAN:    ICD-10-CM   1. Exudative age-related macular degeneration of left eye with active choroidal neovascularization (HCC)  H35.3221 Intravitreal Injection, Pharmacologic Agent - OS - Left Eye    Bevacizumab (AVASTIN) SOLN 1.25 mg    2. 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 Retinal edema  H35.81 OCT, Retina - OU - Both Eyes    6. Essential hypertension  I10     7. Hypertensive retinopathy of both eyes  H35.033     8. Pseudophakia, both eyes  Z96.1      1-3. Idiopathic polypoidal choroidal vasculopathy (PCV) OU  - Exudative age related macular degeneration, OS  - Nonexudative ARMD OD  - was seen in 2016 here by JDM  - transferred care to [facility] w/ Dr. Dorette Grate -- received intravitreal injxns  - then transferred to Dr. Quinn Axe -- last visit 07/2019 -- IVE OD #7 for DME  - has seen Dr. Sherryll Burger twice  - FA (11.29.21) shows peripapillary CNV OS + peripheral leakage OU consistent w/ PCV  - ICG (02.28.22) shows polypoidal choroidal vasculopathy (PCV); Focal hyper cyanscence superior to disc -- cluster of polyps   - here, s/p IVA OS #1 (11.29.21), #2 (12.31.21), #3 (01.31.22), #4 (02.28.22), #5 (03.30.22), #6 (04.27.22), #7 (05.27.22), # 8 (06.24.22), #9 (07.25.22), #10 (08.29.22--JDM)  - OCT shows peripapillary CNV w/  interval resolution of overlying SRF OS at 4 wks; OD without exudative disease  - BCVA 20/20 OD; 20/25 OS  - recommend IVA OS #11 today, 09.26.22  - pt wishes to proceed  - RBA of procedure discussed, questions answered  - informed consent obtained and signed  - see procedure note - informed consent obtained, signed and scanned, 11.29.21 (IVA OS) - see procedure note  - f/u in 4 wks -- DFE/OCT, possible injection--scan through schisis OS (0200-0430)  4,5. Retinoschisis OS  - focal, bullous schisis cavity in temporal periphery 2-430  - widefield OCT confirms schisis w/o RD  - stable  - continue monitoring for now  - may need laser if retinoschisis progresses posteriorly  6,7. Hypertensive retinopathy OU - discussed importance of tight BP  control - monitor  8. Pseudophakia OU  - s/p CE/IOL (Dr. Randon Goldsmith)  - IOL in good position, doing well - monitor   Ophthalmic Meds Ordered this visit:  Meds ordered this encounter  Medications   Bevacizumab (AVASTIN) SOLN 1.25 mg       Return in about 4 weeks (around 01/05/2021) for 4 weeks PCV OU, DFE, OCT.  There are no Patient Instructions on file for this visit.  This document serves as a record of services personally performed by Karie Chimera, MD, PhD. It was created on their behalf by Herby Abraham, COA, an ophthalmic technician. The creation of this record is the provider's dictation and/or activities during the visit.    Electronically signed by: Herby Abraham, COA @TODAY @ 12:48 PM  , M.D., Ph.D. Diseases & Surgery of the Retina and Vitreous [facility] 12/08/2020   I have reviewed the above documentation for accuracy and  completeness, and I agree with the above. Karie Chimera, M.D., Ph.D. 12/08/20 12:48 PM   Abbreviations: M myopia (nearsighted); A astigmatism; H hyperopia (farsighted); P presbyopia; Mrx spectacle prescription;  CTL contact lenses; OD right eye; OS left eye; OU both eyes  XT exotropia; ET esotropia; PEK punctate epithelial keratitis; PEE punctate epithelial erosions; DES dry eye syndrome; MGD meibomian gland dysfunction; ATs artificial tears; PFAT's preservative free artificial tears; NSC nuclear sclerotic cataract; PSC posterior subcapsular cataract; ERM epi-retinal membrane; PVD posterior vitreous detachment; RD retinal detachment; DM diabetes mellitus; DR diabetic retinopathy; NPDR non-proliferative diabetic retinopathy; PDR proliferative diabetic retinopathy; CSME clinically significant macular edema; DME diabetic macular edema; dbh dot blot hemorrhages; CWS cotton wool spot; POAG primary open angle glaucoma; C/D cup-to-disc ratio; HVF humphrey visual field; GVF goldmann visual field; OCT optical coherence  tomography; IOP intraocular pressure; BRVO Branch retinal vein occlusion; CRVO central retinal vein occlusion; CRAO central retinal artery occlusion; BRAO branch retinal artery occlusion; RT retinal tear; SB scleral buckle; PPV pars plana vitrectomy; VH Vitreous hemorrhage; PRP panretinal laser photocoagulation; IVK intravitreal kenalog; VMT vitreomacular traction; MH Macular hole;  NVD neovascularization of the disc; NVE neovascularization elsewhere; AREDS age related eye disease study; ARMD age related macular degeneration; POAG primary open angle glaucoma; EBMD epithelial/anterior basement membrane dystrophy; ACIOL anterior chamber intraocular lens; IOL intraocular lens; PCIOL posterior chamber intraocular lens; Phaco/IOL phacoemulsification with intraocular lens placement; PRK photorefractive keratectomy; LASIK laser assisted in situ keratomileusis; HTN hypertension; DM diabetes mellitus; COPD chronic obstructive pulmonary disease

## 2020-12-08 ENCOUNTER — Ambulatory Visit (INDEPENDENT_AMBULATORY_CARE_PROVIDER_SITE_OTHER): Payer: Medicare Other | Admitting: Ophthalmology

## 2020-12-08 ENCOUNTER — Encounter (INDEPENDENT_AMBULATORY_CARE_PROVIDER_SITE_OTHER): Payer: Self-pay | Admitting: Ophthalmology

## 2020-12-08 ENCOUNTER — Other Ambulatory Visit: Payer: Self-pay

## 2020-12-08 DIAGNOSIS — H35033 Hypertensive retinopathy, bilateral: Secondary | ICD-10-CM

## 2020-12-08 DIAGNOSIS — H353112 Nonexudative age-related macular degeneration, right eye, intermediate dry stage: Secondary | ICD-10-CM

## 2020-12-08 DIAGNOSIS — H318 Other specified disorders of choroid: Secondary | ICD-10-CM

## 2020-12-08 DIAGNOSIS — Z961 Presence of intraocular lens: Secondary | ICD-10-CM

## 2020-12-08 DIAGNOSIS — H353221 Exudative age-related macular degeneration, left eye, with active choroidal neovascularization: Secondary | ICD-10-CM | POA: Diagnosis not present

## 2020-12-08 DIAGNOSIS — H33102 Unspecified retinoschisis, left eye: Secondary | ICD-10-CM | POA: Diagnosis not present

## 2020-12-08 DIAGNOSIS — H3581 Retinal edema: Secondary | ICD-10-CM

## 2020-12-08 DIAGNOSIS — I10 Essential (primary) hypertension: Secondary | ICD-10-CM

## 2020-12-08 MED ORDER — BEVACIZUMAB CHEMO INJECTION 1.25MG/0.05ML SYRINGE FOR KALEIDOSCOPE
1.2500 mg | INTRAVITREAL | Status: AC | PRN
  Administered 2020-12-08: 1.25 mg via INTRAVITREAL

## 2020-12-28 ENCOUNTER — Other Ambulatory Visit: Payer: Self-pay | Admitting: Cardiology

## 2020-12-28 DIAGNOSIS — R072 Precordial pain: Secondary | ICD-10-CM

## 2020-12-28 DIAGNOSIS — I2510 Atherosclerotic heart disease of native coronary artery without angina pectoris: Secondary | ICD-10-CM

## 2020-12-28 DIAGNOSIS — I10 Essential (primary) hypertension: Secondary | ICD-10-CM

## 2020-12-30 ENCOUNTER — Ambulatory Visit (INDEPENDENT_AMBULATORY_CARE_PROVIDER_SITE_OTHER): Payer: Medicare Other | Admitting: Podiatry

## 2020-12-30 ENCOUNTER — Encounter: Payer: Self-pay | Admitting: Podiatry

## 2020-12-30 ENCOUNTER — Other Ambulatory Visit: Payer: Self-pay

## 2020-12-30 DIAGNOSIS — M79675 Pain in left toe(s): Secondary | ICD-10-CM | POA: Diagnosis not present

## 2020-12-30 DIAGNOSIS — E119 Type 2 diabetes mellitus without complications: Secondary | ICD-10-CM | POA: Diagnosis not present

## 2020-12-30 DIAGNOSIS — L84 Corns and callosities: Secondary | ICD-10-CM

## 2020-12-30 DIAGNOSIS — M79674 Pain in right toe(s): Secondary | ICD-10-CM | POA: Diagnosis not present

## 2020-12-30 DIAGNOSIS — B351 Tinea unguium: Secondary | ICD-10-CM | POA: Diagnosis not present

## 2021-01-01 NOTE — Progress Notes (Addendum)
[facility] - Clinic Note  01/05/2021     CHIEF COMPLAINT Patient presents for Retina Follow Up   HISTORY OF PRESENT ILLNESS: Kylie Wilson is a 70 y.o. female who presents to the clinic today for:   HPI     Retina Follow Up   Patient presents with  Wet AMD.  In left eye.  This started weeks ago.  Severity is moderate.  Duration of weeks.  Since onset it is stable.  I, the attending physician,  performed the HPI with the patient and updated documentation appropriately.        Comments   Pt states vision is not any better.  States if she had to rely on her left eye alone, she would not feel safe driving.   Pt denies eye pain.  Pt complains of redness after use of brimonidine and rocklatan.  Pt has discontinued brimonidine and rocklatan on her own.  Pt is using dorz/tim BID OU.      Last edited by Kylie Chris, MD on 01/05/2021  5:09 PM.    Pt states her vision is not any better, she states when she is driving and closes her right eye her vision looks worse to her  Referring physician: Truett Perna, MD [id number] [place],  [place] [address]  HISTORICAL INFORMATION:   Selected notes from the MEDICAL RECORD NUMBER Previous Kylie Wilson pt (2016) self-referred for possible RD LEE:  Ocular Hx- PCV diagnosed by Kylie Wilson at [facility]; followed w/ Dr. Rhina Wilson for DM PMH-    CURRENT MEDICATIONS: Current Outpatient Medications (Ophthalmic Drugs)  Medication Sig   brimonidine (ALPHAGAN) 0.2 % ophthalmic solution Place 1 drop into both eyes 2 times daily.   dorzolamide-timolol (COSOPT) 22.3-6.8 MG/ML ophthalmic solution Place 1 drop into both eyes 2 (two) times daily.   Netarsudil-Latanoprost (ROCKLATAN) 0.02-0.005 % SOLN Apply to eye.   No current facility-administered medications for this visit. (Ophthalmic Drugs)   Current Outpatient Medications (Other)  Medication Sig   ACCU-CHEK AVIVA PLUS test strip USE TO TEST BLOOD SUGARS DAILY.    amlodipine-olmesartan (AZOR) 10-20 MG tablet Take 1 tablet by mouth daily.   Ascorbic Acid (VITAMIN C) 1000 MG tablet Take 1,000 mg by mouth daily.   Blood Glucose Monitoring Suppl (GLUCOCOM BLOOD GLUCOSE MONITOR) DEVI One Touch Verio Meter. FSBS BID. E11.319   Cholecalciferol (VITAMIN D3 PO) Take 5,000 Units by mouth daily.   empagliflozin (JARDIANCE) 25 MG TABS tablet Take 1 tablet by mouth daily.   ferrous sulfate 325 (65 FE) MG tablet Take by mouth.   hydrochlorothiazide (HYDRODIURIL) 25 MG tablet TAKE 1 TABLET BY MOUTH EVERY DAY IN THE MORNING   Lancets (ONETOUCH DELICA PLUS LANCET33G) MISC Apply topically.   LINZESS 72 MCG capsule Take 72 mcg by mouth daily as needed (abdominal cramping).    metFORMIN (GLUCOPHAGE) 500 MG tablet Take 1,000 mg by mouth 2 (two) times daily with a meal.    metoprolol succinate (TOPROL-XL) 25 MG 24 hr tablet TAKE 1 TAB EVERY MORNING. HOLD IF SYSTOLIC BLOOD PRESSURE (TOP NUMBER) <100 MMHG OR PULSE <60 BPM   pantoprazole (PROTONIX) 40 MG tablet Take 40 mg by mouth daily.   rosuvastatin (CRESTOR) 10 MG tablet Take 1 tablet (10 mg total) by mouth at bedtime for 90 doses.   No current facility-administered medications for this visit. (Other)   REVIEW OF SYSTEMS: ROS   Positive for: Gastrointestinal, Neurological, Musculoskeletal, Endocrine, Cardiovascular, Eyes, Respiratory Negative for: Constitutional, Skin,  Genitourinary, HENT, Psychiatric, Allergic/Imm, Heme/Lymph Last edited by Kylie Wilson on 01/05/2021  1:18 PM.     ALLERGIES Allergies  Allergen Reactions   Duloxetine Other (See Comments)    Pt says it also caused high pressure.   Lisinopril Other (See Comments)    cough   PAST MEDICAL HISTORY Past Medical History:  Diagnosis Date   Arthritis    Barrett's esophagus    Burning pain    Cervical radiculopathy    Cervical spondylosis    Coronary artery calcification    DDD (degenerative disc disease), cervical    Diabetes mellitus     type 2    GERD (gastroesophageal reflux disease)    Hyperlipidemia    Hypertension    Hypertensive retinopathy    OU   Macular degeneration    Dry OD; Wet OS   Obesity    Rectocele    Sleep apnea    does not tolerate cpap   Uterine prolapse    Past Surgical History:  Procedure Laterality Date   BREAST BIOPSY  1980   left   BREAST EXCISIONAL BIOPSY Left    Bening 1980's   CATARACT EXTRACTION Bilateral    Dr. Randon Wilson   EYE SURGERY Bilateral    Cat Sx - Dr. Randon Wilson   SCAR REVISION  2001   both legs   TOE SURGERY  1981   4th and 5th toe right foot   TOE SURGERY  2002   great toe left   TONSILLECTOMY     TUBAL LIGATION      FAMILY HISTORY Family History  Problem Relation Age of Onset   Heart disease Father    Pancreatic cancer Father    Breast cancer Paternal Aunt    Stroke Mother    Heart disease Brother    Colon cancer Neg Hx    Stomach cancer Neg Hx    Colon polyps Neg Hx    Rectal cancer Neg Hx    SOCIAL HISTORY Social History   Tobacco Use   Smoking status: Former    Packs/day: 0.80    Years: 30.00    Pack years: 24.00    Types: Cigarettes    Quit date: 09/15/2011    Years since quitting: 9.3   Smokeless tobacco: Never   Tobacco comments:    smoked 20 years; then quit 10; smoked 30 years plus  Vaping Use   Vaping Use: Never used  Substance Use Topics   Alcohol use: Yes    Comment: Every evening, 3 glasses of wine    Drug use: No       OPHTHALMIC EXAM: Base Eye Exam     Visual Acuity (Snellen - Linear)       Right Left   Dist cc 20/20 -1 20/30 -1   Dist ph cc  20/25 -2         Tonometry (Tonopen, 1:24 PM)       Right Left   Pressure 15 20         Pupils       Dark Light Shape React APD   Right 2 1 Round Minimal 0   Left 2 1 Round Minimal 0         Visual Fields       Left Right    Full Full         Extraocular Movement       Right Left    Full Full  Neuro/Psych     Oriented x3: Yes   Mood/Affect:  Normal         Dilation     Both eyes: 1.0% Mydriacyl, 2.5% Phenylephrine @ 1:24 PM           Slit Lamp and Fundus Exam     Slit Lamp Exam       Right Left   Lids/Lashes Dermatochalasis - upper lid, Meibomian gland dysfunction Dermatochalasis - upper lid, Meibomian gland dysfunction   Conjunctiva/Sclera Melanosis, 1+ Injection Melanosis, 1+ Injection   Cornea trace Punctate epithelial erosions, arcus, high tear lake 1+Punctate epithelial erosions, arcus, high tear lake   Anterior Chamber Deep and quiet Deep and quiet   Iris Round and moderately dilated, No NVI Round and moderately dilated to 5.53mm, No NVI   Lens PC IOL in good position with open PC PC IOL in good position with open PC   Vitreous Vitreous syneresis, scattered silicone oil bubbles Vitreous syneresis, silicone oil bubbles         Fundus Exam       Right Left   Disc Trace Pallor, +cupping, Thin inferior rim mild pallor, Sharp rim, Peripapillary atrophy, Thin inferior rim   C/D Ratio 0.7 0.6   Macula Flat, Blunted foveal reflex, Drusen, RPE mottling and clumping, No heme or edema good foveal reflex, RPE clumping and atrophy, peripapillary SRF/edema SN macula - persistent, +ERM, pigmented atrophy/scarring IN macula, no heme   Vessels attenuated, Tortuous attenuated, mild tortuousity   Periphery Attached    Attached, bullous schisis cavity from 0200-0430 -- stable; no RT/RD           Refraction     Wearing Rx       Sphere Cylinder Axis Add   Right -0.75 +2.00 163 +1.75   Left +0.00 +1.25 169 +1.75           IMAGING AND PROCEDURES  Imaging and Procedures for 01/05/2021  OCT, Retina - OU - Both Eyes       Right Eye Quality was good. Central Foveal Thickness: 213. Progression has been stable. Findings include no IRF, no SRF, retinal drusen , outer retinal atrophy, vitreomacular adhesion , normal foveal contour, intraretinal hyper-reflective material.   Left Eye Quality was good. Central Foveal  Thickness: 228. Progression has been stable. Findings include normal foveal contour, no IRF, retinal drusen , subretinal hyper-reflective material, pigment epithelial detachment, choroidal neovascular membrane, outer retinal atrophy, vitreomacular adhesion , no SRF (Mild persistent peripapillary SRF superior to disc, persistent PED).   Notes *Images captured and stored on drive  Diagnosis / Impression:  OD: nonexudative ARMD OS: mild persistent peripapillary SRF superior to disc, persistent PED  Clinical management:  See below  Abbreviations: NFP - Normal foveal profile. CME - cystoid macular edema. PED - pigment epithelial detachment. IRF - intraretinal fluid. SRF - subretinal fluid. EZ - ellipsoid zone. ERM - epiretinal membrane. ORA - outer retinal atrophy. ORT - outer retinal tubulation. SRHM - subretinal hyper-reflective material. IRHM - intraretinal hyper-reflective material      Intravitreal Injection, Pharmacologic Agent - OS - Left Eye       Time Out 01/05/2021. 2:00 PM. Confirmed correct patient, procedure, site, and patient consented.   Anesthesia Topical anesthesia was used. Anesthetic medications included Lidocaine 2%, Proparacaine 0.5%.   Procedure Preparation included 5% betadine to ocular surface, eyelid speculum. A supplied (32g) needle was used.   Injection: 1.25 mg Bevacizumab 1.25mg /0.78ml   Route: Intravitreal, Site: Left Eye   NDC:  64332-951-88, Lot: [id number] , Expiration date: 02/25/2021, Waste: 0 mL   Post-op Post injection exam found visual acuity of at least counting fingers. The patient tolerated the procedure well. There were no complications. The patient received written and verbal post procedure care education.            ASSESSMENT/PLAN:   ICD-10-CM   1. 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 Retinal edema  H35.81 OCT, Retina - OU - Both Eyes    6. Essential hypertension  I10     7. Hypertensive retinopathy of both eyes  H35.033     8. Pseudophakia, both eyes  Z96.1      1-3. Idiopathic polypoidal choroidal vasculopathy (PCV) OU  - Exudative age related macular degeneration, OS  - Nonexudative ARMD OD  - was seen in 2016 here by JDM  - transferred care to [facility] w/ Dr. [facility] -- received intravitreal injxns  - then transferred to Kylie Wilson -- last visit 07/2019 -- IVE OD #7 for DME  - has seen Dr. 08/2019 twice  - FA (11.29.21) shows peripapillary CNV OS + peripheral leakage OU consistent w/ PCV  - ICG (02.28.22) shows polypoidal choroidal vasculopathy (PCV); Focal hyper cyanscence superior to disc -- cluster of polyps   - here, s/p IVA OS #1 (11.29.21), #2 (12.31.21), #3 (01.31.22), #4 (02.28.22), #5 (03.30.22), #6 (04.27.22), #7 (05.27.22), # 8 (06.24.22), #9 (07.25.22), #10 (08.29.22--JDM), #11 (09.26.22)  - OCT shows peripapillary CNV w/ mild persistent overlying SRF OS at 4 wks; OD without exudative disease  - possible IVA resistance OS  - BCVA 20/20 OD; 20/25 OS  - recommend IVA OS #12 today, 10.24.22  - pt wishes to proceed  - RBA of procedure discussed, questions answered  - informed consent obtained and signed  - see procedure note - informed consent obtained, signed and scanned, 11.29.21 (IVA OS) - see procedure note - will check insurance authorization and [employer] for possible IVE OS  - f/u in 4-5 wks -- DFE/OCT, possible injection--scan through schisis OS (0200-0430)  4,5. Retinoschisis OS  - focal, bullous schisis cavity in temporal periphery 2-430  - widefield OCT confirms schisis w/o RD  - stable  - continue monitoring for now  -  may need laser if retinoschisis progresses posteriorly  6,7. Hypertensive retinopathy OU - discussed importance of tight BP control - monitor  8. Pseudophakia OU  - s/p CE/IOL (Dr. 12.01.21)  - IOL in good position, doing well - monitor   Ophthalmic Meds Ordered this visit:  Meds ordered this encounter  Medications   Bevacizumab (AVASTIN) SOLN 1.25 mg     Return for f/u 4-5 weeks PCV OU, DFE, OCT.  There are no Patient Instructions on file for this visit.  This document serves as a record of services personally performed by Kylie Goldsmith, MD, PhD. It was created on their behalf by Karie Chimera, COA, an ophthalmic technician. The creation of this record is the provider's dictation and/or activities during the visit.    Electronically signed by: Herby Abraham, COA @TODAY @ 9:19 PM  This document serves as a record of services personally  performed by Karie Chimera, MD, PhD. It was created on their behalf by Glee Arvin. Manson Passey, OA an ophthalmic technician. The creation of this record is the provider's dictation and/or activities during the visit.    Electronically signed by: Glee Arvin. Manson Passey, [place] 10.24.2022 9:19 PM  Karie Chimera, M.D., Ph.D. Diseases & Surgery of the Retina and Vitreous [facility] 01/05/2021   I have reviewed the above documentation for accuracy and completeness, and I agree with the above. Karie Chimera, M.D., Ph.D. 01/05/21 9:19 PM   Abbreviations: M myopia (nearsighted); A astigmatism; H hyperopia (farsighted); P presbyopia; Mrx spectacle prescription;  CTL contact lenses; OD right eye; OS left eye; OU both eyes  XT exotropia; ET esotropia; PEK punctate epithelial keratitis; PEE punctate epithelial erosions; DES dry eye syndrome; MGD meibomian gland dysfunction; ATs artificial tears; PFAT's preservative free artificial tears; NSC nuclear sclerotic cataract; PSC posterior subcapsular cataract; ERM epi-retinal membrane; PVD posterior  vitreous detachment; RD retinal detachment; DM diabetes mellitus; DR diabetic retinopathy; NPDR non-proliferative diabetic retinopathy; PDR proliferative diabetic retinopathy; CSME clinically significant macular edema; DME diabetic macular edema; dbh dot blot hemorrhages; CWS cotton wool spot; POAG primary open angle glaucoma; C/D cup-to-disc ratio; HVF humphrey visual field; GVF goldmann visual field; OCT optical coherence tomography; IOP intraocular pressure; BRVO Branch retinal vein occlusion; CRVO central retinal vein occlusion; CRAO central retinal artery occlusion; BRAO branch retinal artery occlusion; RT retinal tear; SB scleral buckle; PPV pars plana vitrectomy; VH Vitreous hemorrhage; PRP panretinal laser photocoagulation; IVK intravitreal kenalog; VMT vitreomacular traction; MH Macular hole;  NVD neovascularization of the disc; NVE neovascularization elsewhere; AREDS age related eye disease study; ARMD age related macular degeneration; POAG primary open angle glaucoma; EBMD epithelial/anterior basement membrane dystrophy; ACIOL anterior chamber intraocular lens; IOL intraocular lens; PCIOL posterior chamber intraocular lens; Phaco/IOL phacoemulsification with intraocular lens placement; PRK photorefractive keratectomy; LASIK laser assisted in situ keratomileusis; HTN hypertension; DM diabetes mellitus; COPD chronic obstructive pulmonary disease

## 2021-01-04 NOTE — Progress Notes (Signed)
  Subjective:  Patient ID: Kylie Wilson, female    DOB: [birth year],  MRN: [id number]  70 y.o. female presents with preventative diabetic foot care and corn(s) bilateral 5th digits and painful thick toenails that are difficult to trim. Painful toenails interfere with ambulation. Aggravating factors include wearing enclosed shoe gear. Pain is relieved with periodic professional debridement. Painful corns are aggravated when weightbearing when wearing enclosed shoe gear. Pain is relieved with periodic professional debridement..    Patient's blood sugar was 148 mg/dl today.  P  PCP: Truett Perna, MD and last visit was: 12/19/2020.  Review of Systems: Negative except as noted in the HPI.   Allergies  Allergen Reactions   Duloxetine Other (See Comments)    Pt says it also caused high pressure.   Lisinopril Other (See Comments)    cough    Objective:  There were no vitals filed for this visit. Constitutional Patient is a pleasant 70 y.o. African American female in NAD. AAO x 3.  Vascular Capillary fill time to digits immediate b/l.  DP/PT pulse(s) are palpable b/l lower extremities. Pedal hair absent. Lower extremity skin temperature gradient within normal limits. No pain with calf compression b/l. No edema noted b/l lower extremities. No cyanosis or clubbing noted.   Neurologic Protective sensation intact 5/5 intact bilaterally with 10g monofilament b/l. Vibratory sensation intact b/l. No clonus b/l.   Dermatologic Pedal skin is warm and supple b/l.  No open wounds b/l lower extremities. No interdigital macerations b/l lower extremities. Toenails 1-5 b/l elongated, discolored, dystrophic, thickened, crumbly with subungual debris and tenderness to dorsal palpation. Hyperkeratotic lesion(s) L 5th toe and R 5th toe.  No erythema, no edema, no drainage, no fluctuance.  Orthopedic: Normal muscle strength 5/5 to all lower extremity muscle groups bilaterally. Patient ambulates independent of any  assistive aids. Hammertoe(s) noted to the L 5th toe and R 5th toe.    Assessment:   1. Pain due to onychomycosis of toenails of both feet   2. Corns   3. Controlled type 2 diabetes mellitus without complication, without long-term current use of insulin (HCC)    Plan:  Patient was evaluated and treated and all questions answered. Consent given for treatment as described below: -Continue diabetic foot care principles: inspect feet daily, monitor glucose as recommended by PCP and/or Endocrinologist, and follow prescribed diet per PCP, Endocrinologist and/or dietician. -Patient to continue soft, supportive shoe gear daily. -Toenails 1-5 b/l were debrided in length and girth with sterile nail nippers and dremel without iatrogenic bleeding.  -Corn(s) L 5th toe and R 5th toe pared utilizing sterile scalpel blade without complication or incident. Total number debrided=2. -Patient to report any pedal injuries to medical professional immediately. -Patient/POA to call should there be question/concern in the interim.  Return in about 3 months (around 04/01/2021).  Freddie Breech, DPM

## 2021-01-05 ENCOUNTER — Other Ambulatory Visit: Payer: Self-pay

## 2021-01-05 ENCOUNTER — Encounter (INDEPENDENT_AMBULATORY_CARE_PROVIDER_SITE_OTHER): Payer: Self-pay | Admitting: Ophthalmology

## 2021-01-05 ENCOUNTER — Ambulatory Visit (INDEPENDENT_AMBULATORY_CARE_PROVIDER_SITE_OTHER): Payer: Medicare Other | Admitting: Ophthalmology

## 2021-01-05 DIAGNOSIS — H3581 Retinal edema: Secondary | ICD-10-CM

## 2021-01-05 DIAGNOSIS — H353112 Nonexudative age-related macular degeneration, right eye, intermediate dry stage: Secondary | ICD-10-CM

## 2021-01-05 DIAGNOSIS — Z961 Presence of intraocular lens: Secondary | ICD-10-CM

## 2021-01-05 DIAGNOSIS — I10 Essential (primary) hypertension: Secondary | ICD-10-CM

## 2021-01-05 DIAGNOSIS — H33102 Unspecified retinoschisis, left eye: Secondary | ICD-10-CM | POA: Diagnosis not present

## 2021-01-05 DIAGNOSIS — H318 Other specified disorders of choroid: Secondary | ICD-10-CM

## 2021-01-05 DIAGNOSIS — H353221 Exudative age-related macular degeneration, left eye, with active choroidal neovascularization: Secondary | ICD-10-CM | POA: Diagnosis not present

## 2021-01-05 DIAGNOSIS — H35033 Hypertensive retinopathy, bilateral: Secondary | ICD-10-CM

## 2021-01-05 MED ORDER — BEVACIZUMAB CHEMO INJECTION 1.25MG/0.05ML SYRINGE FOR KALEIDOSCOPE
1.2500 mg | INTRAVITREAL | Status: AC | PRN
  Administered 2021-01-05: 1.25 mg via INTRAVITREAL

## 2021-02-04 NOTE — Progress Notes (Signed)
[facility] - Clinic Note  02/09/2021     CHIEF COMPLAINT Patient presents for Retina Follow Up   HISTORY OF PRESENT ILLNESS: Kylie Wilson is a 70 y.o. female who presents to the clinic today for:   HPI     Retina Follow Up   Patient presents with  Other.  In both eyes.  This started years ago.  Severity is moderate.  Duration of 5 weeks.  Since onset it is gradually worsening.  I, the attending physician,  performed the HPI with the patient and updated documentation appropriately.        Comments   70 y/o female pt here for 5 wk f/u for idiopathic PCV OU.  No change in [place] OD.  Feels VA OS is gradually getting worse over the past several mos.  Has occasional pain OS.  Also has occasional temporal FOL and floaters OS.  Cosopt BID OU.      Last edited by Kylie Chris, MD on 02/09/2021  4:10 PM.     Pt states if she had to rely on her left eye alone she doesn't feel like she would be able to drive  Referring physician: Truett Perna, MD [phone] [place],  [place] [address]  HISTORICAL INFORMATION:   Selected notes from the MEDICAL RECORD NUMBER Previous Kylie Wilson pt (2016) self-referred for possible RD LEE:  Ocular Hx- PCV diagnosed by Dr. Dorette Wilson at [facility]; followed w/ Dr. Rhina Wilson for DM PMH-    CURRENT MEDICATIONS: Current Outpatient Medications (Ophthalmic Drugs)  Medication Sig   dorzolamide-timolol (COSOPT) 22.3-6.8 MG/ML ophthalmic solution Place 1 drop into both eyes 2 (two) times daily.   No current facility-administered medications for this visit. (Ophthalmic Drugs)   Current Outpatient Medications (Other)  Medication Sig   ACCU-CHEK AVIVA PLUS test strip USE TO TEST BLOOD SUGARS DAILY.   amlodipine-olmesartan (AZOR) 10-20 MG tablet Take 1 tablet by mouth daily.   Ascorbic Acid (VITAMIN C) 1000 MG tablet Take 1,000 mg by mouth daily.   aspirin EC 81 MG tablet Take 1 tablet (81 mg total) by mouth daily. Swallow  whole.   Blood Glucose Monitoring Suppl (GLUCOCOM BLOOD GLUCOSE MONITOR) DEVI One Touch Verio Meter. FSBS BID. E11.319   Cholecalciferol (VITAMIN D3 PO) Take 5,000 Units by mouth daily.   empagliflozin (JARDIANCE) 25 MG TABS tablet Take 1 tablet by mouth daily.   ferrous sulfate 325 (65 FE) MG tablet Take by mouth.   hydrochlorothiazide (HYDRODIURIL) 25 MG tablet TAKE 1 TABLET BY MOUTH EVERY DAY IN THE MORNING   Lancets (ONETOUCH DELICA PLUS LANCET33G) MISC Apply topically.   LINZESS 72 MCG capsule Take 72 mcg by mouth daily as needed (abdominal cramping).    metFORMIN (GLUCOPHAGE) 500 MG tablet Take 1,000 mg by mouth 2 (two) times daily with a meal.    pantoprazole (PROTONIX) 40 MG tablet Take 40 mg by mouth daily.   rosuvastatin (CRESTOR) 10 MG tablet Take 1 tablet (10 mg total) by mouth at bedtime for 90 doses.   No current facility-administered medications for this visit. (Other)   REVIEW OF SYSTEMS: ROS   Positive for: Gastrointestinal, Neurological, Musculoskeletal, Endocrine, Cardiovascular, Eyes, Respiratory Negative for: Constitutional, Skin, Genitourinary, HENT, Psychiatric, Allergic/Imm, Heme/Lymph Last edited by Kylie Wilson, COA on 02/09/2021  3:21 PM.      ALLERGIES Allergies  Allergen Reactions   Duloxetine Other (See Comments)    Pt says it also caused high pressure.   Lisinopril Other (  See Comments)    cough   PAST MEDICAL HISTORY Past Medical History:  Diagnosis Date   Arthritis    Barrett's esophagus    Burning pain    Cervical radiculopathy    Cervical spondylosis    Coronary artery calcification    DDD (degenerative disc disease), cervical    Diabetes mellitus    type 2    GERD (gastroesophageal reflux disease)    Hyperlipidemia    Hypertension    Hypertensive retinopathy    OU   Macular degeneration    Dry OD; Wet OS   Obesity    Rectocele    Sleep apnea    does not tolerate cpap   Uterine prolapse    Past Surgical History:  Procedure  Laterality Date   BREAST BIOPSY  1980   left   BREAST EXCISIONAL BIOPSY Left    Bening 1980's   CATARACT EXTRACTION Bilateral    Dr. Randon Wilson   EYE SURGERY Bilateral    Cat Sx - Dr. Randon Wilson   SCAR REVISION  2001   both legs   TOE SURGERY  1981   4th and 5th toe right foot   TOE SURGERY  2002   great toe left   TONSILLECTOMY     TUBAL LIGATION      FAMILY HISTORY Family History  Problem Relation Age of Onset   Heart disease Father    Pancreatic cancer Father    Breast cancer Paternal Aunt    Stroke Mother    Heart disease Brother    Colon cancer Neg Hx    Stomach cancer Neg Hx    Colon polyps Neg Hx    Rectal cancer Neg Hx    SOCIAL HISTORY Social History   Tobacco Use   Smoking status: Former    Packs/day: 0.80    Years: 30.00    Pack years: 24.00    Types: Cigarettes    Quit date: 09/15/2011    Years since quitting: 9.4   Smokeless tobacco: Never   Tobacco comments:    smoked 20 years; then quit 10; smoked 30 years plus  Vaping Use   Vaping Use: Never used  Substance Use Topics   Alcohol use: Yes    Comment: Every evening, 3 glasses of wine    Drug use: No       OPHTHALMIC EXAM: Base Eye Exam     Visual Acuity (Snellen - Linear)       Right Left   Dist cc 20/20 - 20/30 -   Dist ph cc  NI    Correction: Glasses         Tonometry (Tonopen, 3:23 PM)       Right Left   Pressure 17 12         Pupils       Dark Light Shape React APD   Right 2 1 Round Minimal None   Left 2 1 Round Minimal None         Visual Fields (Counting fingers)       Left Right    Full Full         Extraocular Movement       Right Left    Full Full         Neuro/Psych     Oriented x3: Yes   Mood/Affect: Normal         Dilation     Both eyes: 1.0% Mydriacyl, 2.5% Phenylephrine @ 3:23 PM  Slit Lamp and Fundus Exam     Slit Lamp Exam       Right Left   Lids/Lashes Dermatochalasis - upper lid, Meibomian gland dysfunction  Dermatochalasis - upper lid, Meibomian gland dysfunction   Conjunctiva/Sclera Melanosis, 1+ Injection Melanosis, 1+ Injection   Cornea trace Punctate epithelial erosions, arcus, high tear lake 1+Punctate epithelial erosions, arcus, high tear lake   Anterior Chamber Deep and quiet Deep and quiet   Iris Round and moderately dilated, No NVI Round and moderately dilated to 5.43mm, No NVI   Lens PC IOL in good position with open PC PC IOL in good position with open PC   Anterior Vitreous Vitreous syneresis, scattered silicone oil bubbles Vitreous syneresis, silicone oil bubbles         Fundus Exam       Right Left   Disc Trace Pallor, +cupping, Thin inferior rim mild pallor, Sharp rim, Peripapillary atrophy, Thin inferior rim   C/D Ratio 0.7 0.6   Macula Flat, Blunted foveal reflex, Drusen, RPE mottling and clumping, No heme or edema good foveal reflex, RPE clumping and atrophy, peripapillary SRF/edema SN macula - improved, +ERM, pigmented atrophy/scarring IN macula, no heme   Vessels attenuated, Tortuous attenuated, mild tortuousity   Periphery Attached    Attached, bullous schisis cavity from 0200-0430 -- stable; no RT/RD           IMAGING AND PROCEDURES  Imaging and Procedures for 02/09/2021  OCT, Retina - OU - Both Eyes       Right Eye Quality was good. Central Foveal Thickness: 213. Progression has been stable. Findings include no IRF, no SRF, retinal drusen , outer retinal atrophy, vitreomacular adhesion , normal foveal contour, intraretinal hyper-reflective material.   Left Eye Quality was good. Central Foveal Thickness: 228. Progression has improved. Findings include normal foveal contour, no IRF, retinal drusen , subretinal hyper-reflective material, pigment epithelial detachment, choroidal neovascular membrane, outer retinal atrophy, vitreomacular adhesion , no SRF (Interval improvement in SRF overlying PED superior to disc ).   Notes *Images captured and stored on  drive  Diagnosis / Impression:  OD: nonexudative ARMD OS: Interval improvement in SRF overlying PED superior to disc   Clinical management:  See below  Abbreviations: NFP - Normal foveal profile. CME - cystoid macular edema. PED - pigment epithelial detachment. IRF - intraretinal fluid. SRF - subretinal fluid. EZ - ellipsoid zone. ERM - epiretinal membrane. ORA - outer retinal atrophy. ORT - outer retinal tubulation. SRHM - subretinal hyper-reflective material. IRHM - intraretinal hyper-reflective material      Intravitreal Injection, Pharmacologic Agent - OS - Left Eye       Time Out 02/09/2021. 3:42 PM. Confirmed correct patient, procedure, site, and patient consented.   Anesthesia Topical anesthesia was used. Anesthetic medications included Lidocaine 2%, Proparacaine 0.5%.   Procedure Preparation included 5% betadine to ocular surface, eyelid speculum. A (32g) needle was used.   Injection: 1.25 mg Bevacizumab 1.25mg /0.2ml   Route: Intravitreal, Site: Left Eye   NDC: [id number], Lot: [id number], Expiration date: 03/18/2021, Waste: 0.05 mL   Post-op Post injection exam found visual acuity of at least counting fingers. The patient tolerated the procedure well. There were no complications. The patient received written and verbal post procedure care education.             ASSESSMENT/PLAN:   ICD-10-CM   1. 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 Retinal edema  H35.81 OCT, Retina - OU - Both Eyes    6. Essential hypertension  I10     7. Hypertensive retinopathy of both eyes  H35.033     8. Pseudophakia, both  eyes  Z96.1       1-3. Idiopathic polypoidal choroidal vasculopathy (PCV) OU  - Exudative age related macular degeneration, OS  - Nonexudative ARMD OD  - was seen in 2016 here by JDM  - transferred care to [facility] w/ Dr. Dorette Wilson -- received intravitreal injxns  - then transferred to Dr. Quinn Axe -- last visit 07/2019 -- IVE OD #7 for DME  - has seen Dr. Sherryll Burger twice  - FA (11.29.21) shows peripapillary CNV OS + peripheral leakage OU consistent w/ PCV  - ICG (02.28.22) shows polypoidal choroidal vasculopathy (PCV); Focal hyper cyanscence superior to disc -- cluster of polyps   - here, s/p IVA OS #1 (11.29.21), #2 (12.31.21), #3 (01.31.22), #4 (02.28.22), #5 (03.30.22), #6 (04.27.22), #7 (05.27.22), # 8 (06.24.22), #9 (07.25.22), #10 (08.29.22--JDM), #11 (09.26.22), #12 (10.24.22)  - OCT shows peripapillary CNV w/ interval improvement in SRF overlying PED/CNV OS at 5 wks; OD without exudative disease  - discussed possible IVA resistance OS -- but good response today  - BCVA 20/20 OD; 20/30 OS  - recommend IVA OS #13 today, 11.28.22 w/ f/u in 5 wks again  - pt wishes to proceed  - RBA of procedure discussed, questions answered  - informed consent obtained and signed  - see procedure note - informed consent obtained, signed and scanned, 11.29.21 (IVA OS) - see procedure note - IVE authorized by [facility], but [facility] no longer taking new pts for 2022  - f/u in 5 wks -- DFE/OCT, possible injection--scan through schisis OS (0200-0430)  4,5. Retinoschisis OS  - focal, bullous schisis cavity in temporal periphery 2-430  - widefield OCT confirms schisis w/o RD  - stable  - continue monitoring for now  - may need laser if retinoschisis progresses posteriorly  6,7. Hypertensive retinopathy OU - discussed importance of tight BP control - monitor  8. Pseudophakia OU  - s/p CE/IOL (Dr. Randon Wilson)  - IOL in good position, doing well - monitor   Ophthalmic Meds Ordered this visit:  Meds ordered this  encounter  Medications   Bevacizumab (AVASTIN) SOLN 1.25 mg     Return in about 5 weeks (around 03/16/2021) for f/u exu ARMD OS, DFE, OCT.  There are no Patient Instructions on file for this visit.  This document serves as a record of services personally performed by Karie Chimera, MD, PhD. It was created on their behalf by Annalee Genta, COMT. The creation of this record is the provider's dictation and/or activities during the visit.  Electronically signed by: Annalee Genta, COMT 02/09/21 4:16 PM  Karie Chimera, M.D., Ph.D. Diseases & Surgery of the Retina and Vitreous [facility]  I have reviewed the above documentation for accuracy and completeness, and I agree with the above. Karie Chimera, M.D., Ph.D. 02/09/21 4:16 PM   Abbreviations: M myopia (nearsighted); A astigmatism; H hyperopia (farsighted); P presbyopia; Mrx spectacle prescription;  CTL contact lenses; OD right eye; OS left eye; OU both  eyes  XT exotropia; ET esotropia; PEK punctate epithelial keratitis; PEE punctate epithelial erosions; DES dry eye syndrome; MGD meibomian gland dysfunction; ATs artificial tears; PFAT's preservative free artificial tears; NSC nuclear sclerotic cataract; PSC posterior subcapsular cataract; ERM epi-retinal membrane; PVD posterior vitreous detachment; RD retinal detachment; DM diabetes mellitus; DR diabetic retinopathy; NPDR non-proliferative diabetic retinopathy; PDR proliferative diabetic retinopathy; CSME clinically significant macular edema; DME diabetic macular edema; dbh dot blot hemorrhages; CWS cotton wool spot; POAG primary open angle glaucoma; C/D cup-to-disc ratio; HVF humphrey visual field; GVF goldmann visual field; OCT optical coherence tomography; IOP intraocular pressure; BRVO Branch retinal vein occlusion; CRVO central retinal vein occlusion; CRAO central retinal artery occlusion; BRAO branch retinal artery occlusion; RT retinal tear; SB scleral buckle; PPV  pars plana vitrectomy; VH Vitreous hemorrhage; PRP panretinal laser photocoagulation; IVK intravitreal kenalog; VMT vitreomacular traction; MH Macular hole;  NVD neovascularization of the disc; NVE neovascularization elsewhere; AREDS age related eye disease study; ARMD age related macular degeneration; POAG primary open angle glaucoma; EBMD epithelial/anterior basement membrane dystrophy; ACIOL anterior chamber intraocular lens; IOL intraocular lens; PCIOL posterior chamber intraocular lens; Phaco/IOL phacoemulsification with intraocular lens placement; PRK photorefractive keratectomy; LASIK laser assisted in situ keratomileusis; HTN hypertension; DM diabetes mellitus; COPD chronic obstructive pulmonary disease

## 2021-02-09 ENCOUNTER — Encounter: Payer: Self-pay | Admitting: Cardiology

## 2021-02-09 ENCOUNTER — Encounter (INDEPENDENT_AMBULATORY_CARE_PROVIDER_SITE_OTHER): Payer: Self-pay | Admitting: Ophthalmology

## 2021-02-09 ENCOUNTER — Other Ambulatory Visit: Payer: Self-pay

## 2021-02-09 ENCOUNTER — Ambulatory Visit (INDEPENDENT_AMBULATORY_CARE_PROVIDER_SITE_OTHER): Payer: Medicare Other | Admitting: Ophthalmology

## 2021-02-09 ENCOUNTER — Ambulatory Visit: Payer: Medicare Other | Admitting: Cardiology

## 2021-02-09 VITALS — BP 147/70 | HR 69 | Resp 16 | Ht 64.0 in | Wt 255.0 lb

## 2021-02-09 DIAGNOSIS — I2584 Coronary atherosclerosis due to calcified coronary lesion: Secondary | ICD-10-CM

## 2021-02-09 DIAGNOSIS — Z87891 Personal history of nicotine dependence: Secondary | ICD-10-CM

## 2021-02-09 DIAGNOSIS — H35033 Hypertensive retinopathy, bilateral: Secondary | ICD-10-CM

## 2021-02-09 DIAGNOSIS — R0609 Other forms of dyspnea: Secondary | ICD-10-CM

## 2021-02-09 DIAGNOSIS — G4733 Obstructive sleep apnea (adult) (pediatric): Secondary | ICD-10-CM

## 2021-02-09 DIAGNOSIS — H353221 Exudative age-related macular degeneration, left eye, with active choroidal neovascularization: Secondary | ICD-10-CM

## 2021-02-09 DIAGNOSIS — I10 Essential (primary) hypertension: Secondary | ICD-10-CM | POA: Diagnosis not present

## 2021-02-09 DIAGNOSIS — H353112 Nonexudative age-related macular degeneration, right eye, intermediate dry stage: Secondary | ICD-10-CM

## 2021-02-09 DIAGNOSIS — Z961 Presence of intraocular lens: Secondary | ICD-10-CM

## 2021-02-09 DIAGNOSIS — R072 Precordial pain: Secondary | ICD-10-CM

## 2021-02-09 DIAGNOSIS — H318 Other specified disorders of choroid: Secondary | ICD-10-CM

## 2021-02-09 DIAGNOSIS — H3581 Retinal edema: Secondary | ICD-10-CM

## 2021-02-09 DIAGNOSIS — E785 Hyperlipidemia, unspecified: Secondary | ICD-10-CM

## 2021-02-09 DIAGNOSIS — E1165 Type 2 diabetes mellitus with hyperglycemia: Secondary | ICD-10-CM

## 2021-02-09 DIAGNOSIS — H33102 Unspecified retinoschisis, left eye: Secondary | ICD-10-CM | POA: Diagnosis not present

## 2021-02-09 MED ORDER — BEVACIZUMAB CHEMO INJECTION 1.25MG/0.05ML SYRINGE FOR KALEIDOSCOPE
1.2500 mg | INTRAVITREAL | Status: AC | PRN
  Administered 2021-02-09: 16:00:00 1.25 mg via INTRAVITREAL

## 2021-02-09 MED ORDER — ASPIRIN EC 81 MG PO TBEC: 81.0000 mg | DELAYED_RELEASE_TABLET | Freq: Every day | ORAL | 11 refills | Status: AC

## 2021-02-09 NOTE — Progress Notes (Signed)
Date:  02/09/2021   ID:  Kylie Wilson, DOB May 24, 1950, MRN [id number]  PCP:  Rudene Anda, MD  Cardiologist:  Rex Kras, DO, [facility], [place] (established care 07/13/2019) Former Cardiology Providers: Dr. Skeet Latch and and Kerin Ransom PA-C  Date: 02/09/21 Last Office Visit: 11/07/2020  Chief Complaint  Patient presents with   Chest Pain   Hypertension   Follow-up    HPI  Kylie Wilson is a 70 y.o. female whose past medical history and cardiovascular risk factors include: Obesity due to excess calories, OSA not on CPAP, dyslipidemia, hypertension, non-insulin-dependent diabetes mellitus type 2, moderate coronary artery calcification, mild nonobstructive CAD per coronary CTA, postmenopausal female, advanced age, and former smoker.  Here with a chief complaint of " reevaluation of chest pain."  In December 2017 she underwent CT chest with contrast which noted coronary calcification and subsequently underwent nuclear stress test which was reported to be normal per report.  She was scheduled to follow-up in 6 months.  In the interim she had a coronary CTA at [facility] records are available in [place] which noted moderate coronary calcification and mild nonobstructive CAD.  At the last office visit she was having symptoms suggestive of noncardiac discomfort and given the recent ischemic work-up the shared decision was to improve her medical therapy.  She was started on Toprol-XL.  However, she stopped taking the medication as she was experiencing shakes " like having a seizure."  She still complains of chest pain, located anteriorly, couple times a week, lasting for a few seconds, intensity 2/10, sharp, not brought on by effort related activities, does not resolve with rest, usually improves with taking a deep breath and are self-limiting.  Since last office visit patient notes better blood pressure control at home.  She is also stopped taking BC powder.  She continues to have  excessive burping while laying in a supine position.  Has been followed by GI and Protonix has been initiated.  Patient was also referred to sleep medicine at the last office visit but has not seen a provider yet.   ALLERGIES: Allergies  Allergen Reactions   Duloxetine Other (See Comments)    Pt says it also caused high pressure.   Lisinopril Other (See Comments)    cough    MEDICATION LIST PRIOR TO VISIT: Current Meds  Medication Sig   ACCU-CHEK AVIVA PLUS test strip USE TO TEST BLOOD SUGARS DAILY.   amlodipine-olmesartan (AZOR) 10-20 MG tablet Take 1 tablet by mouth daily.   Ascorbic Acid (VITAMIN C) 1000 MG tablet Take 1,000 mg by mouth daily.   aspirin EC 81 MG tablet Take 1 tablet (81 mg total) by mouth daily. Swallow whole.   Blood Glucose Monitoring Suppl (GLUCOCOM BLOOD GLUCOSE MONITOR) DEVI One Touch Verio Meter. FSBS BID. E11.319   Cholecalciferol (VITAMIN D3 PO) Take 5,000 Units by mouth daily.   dorzolamide-timolol (COSOPT) 22.3-6.8 MG/ML ophthalmic solution Place 1 drop into both eyes 2 (two) times daily.   empagliflozin (JARDIANCE) 25 MG TABS tablet Take 1 tablet by mouth daily.   ferrous sulfate 325 (65 FE) MG tablet Take by mouth.   hydrochlorothiazide (HYDRODIURIL) 25 MG tablet TAKE 1 TABLET BY MOUTH EVERY DAY IN THE MORNING   Lancets (ONETOUCH DELICA PLUS [id number]) MISC Apply topically.   LINZESS 72 MCG capsule Take 72 mcg by mouth daily as needed (abdominal cramping).    metFORMIN (GLUCOPHAGE) 500 MG tablet Take 1,000 mg by mouth 2 (two) times daily with a meal.  pantoprazole (PROTONIX) 40 MG tablet Take 40 mg by mouth daily.   rosuvastatin (CRESTOR) 10 MG tablet Take 1 tablet (10 mg total) by mouth at bedtime for 90 doses.     PAST MEDICAL HISTORY: Past Medical History:  Diagnosis Date   Arthritis    Barrett's esophagus    Burning pain    Cervical radiculopathy    Cervical spondylosis    Coronary artery calcification    DDD (degenerative disc  disease), cervical    Diabetes mellitus    type 2    GERD (gastroesophageal reflux disease)    Hyperlipidemia    Hypertension    Hypertensive retinopathy    OU   Macular degeneration    Dry OD; Wet OS   Obesity    Rectocele    Sleep apnea    does not tolerate cpap   Uterine prolapse     PAST SURGICAL HISTORY: Past Surgical History:  Procedure Laterality Date   BREAST BIOPSY  1980   left   BREAST EXCISIONAL BIOPSY Left    Bening 1980's   CATARACT EXTRACTION Bilateral    Dr. Prudencio Burly   EYE SURGERY Bilateral    Cat Sx - Dr. Prudencio Burly   SCAR REVISION  2001   both legs   TOE SURGERY  1981   4th and 5th toe right foot   TOE SURGERY  2002   great toe left   TONSILLECTOMY     TUBAL LIGATION      FAMILY HISTORY: The patient family history includes Breast cancer in her paternal aunt; Heart disease in her brother and father; Pancreatic cancer in her father; Stroke in her mother.  SOCIAL HISTORY:  The patient  reports that she quit smoking about 9 years ago. Her smoking use included cigarettes. She has a 24.00 pack-year smoking history. She has never used smokeless tobacco. She reports current alcohol use. She reports that she does not use drugs.  REVIEW OF SYSTEMS: Review of Systems  Constitutional: Negative for chills, fever and malaise/fatigue.  HENT:  Negative for hoarse voice and nosebleeds.   Eyes:  Negative for discharge, double vision and pain.  Cardiovascular:  Positive for chest pain (improved) and dyspnea on exertion. Negative for claudication, leg swelling, near-syncope, orthopnea, palpitations, paroxysmal nocturnal dyspnea and syncope.  Respiratory:  Positive for shortness of breath (chronic). Negative for hemoptysis.   Musculoskeletal:  Negative for muscle cramps and myalgias.  Gastrointestinal:  Negative for abdominal pain, constipation, diarrhea, hematemesis, hematochezia, melena, nausea and vomiting.  Neurological:  Negative for dizziness and light-headedness.    PHYSICAL EXAM: Vitals with BMI 02/09/2021 11/07/2020 11/07/2020  Height 5' 4" - 5' 4"  Weight 255 lbs - 261 lbs 13 oz  BMI 57.01 - 77.93  Systolic 903 009 233  Diastolic 70 66 76  Pulse 69 62 64   CONSTITUTIONAL: Well-developed and well-nourished. No acute distress.  SKIN: Skin is warm and dry. No rash noted. No cyanosis. No pallor. No jaundice HEAD: Normocephalic and atraumatic.  EYES: No scleral icterus MOUTH/THROAT: Moist oral membranes.  NECK: No JVD present. No thyromegaly noted. No carotid bruits  LYMPHATIC: No visible cervical adenopathy.  CHEST Normal respiratory effort. No intercostal retractions  LUNGS: Clear to auscultation bilaterally no stridor. No wheezes. No rales.  CARDIOVASCULAR: Regular rate and rhythm, positive S1-S2, no murmurs rubs or gallops appreciated. ABDOMINAL: Obese, soft, nontender, nondistended, positive bowel sounds all 4 quadrants. No apparent ascites.  EXTREMITIES: No peripheral edema  HEMATOLOGIC: No significant bruising NEUROLOGIC: Oriented to  person, place, and time. Nonfocal. Normal muscle tone.  PSYCHIATRIC: Normal mood and affect. Normal behavior. Cooperative  CARDIAC DATABASE: EKG: 11/07/2020: Normal sinus rhythm, 67 bpm, normal axis, occasional PACs, without underlying ischemia or injury pattern.   Echocardiogram: 07/23/2019:  Normal LV systolic function with visual EF 65-70%. Left ventricle cavity is normal in size. Mild concentric hypertrophy of the left ventricle. Normal global wall motion. Normal diastolic filling pattern, normal LAP. Calculated EF 66%.  Mild mitral valve leaflet thickening. Normal mitral valve leaflet mobility. No evidence of mitral stenosis. Mild (Grade I) mitral regurgitation.  No other significant valvular abnormalities. No prior study for comparison.  Stress Testing: MPI 04/2019:Nuclear stress EF: 70%. The left ventricular ejection fraction is hyperdynamic (>65%). There was no ST segment deviation noted during  stress. This is a low risk study. The study is normal.  Heart Catheterization: None  Coronary CTA performed at [facility]: 07/29/2020 records available via Care Everywhere 1. Three-vessel predominantly calcified atherosclerotic plaque with 25-49% stenosis in the distal left anterior descending and within a prominent  obtuse marginal branch. CAD-RADS 2.  2. Calcium Score: 189. The exact is percentile calculated to be 71%; this means 70% population has a lower calcium score and 29% population has a  higher calcium score.  3. CTFFR Analysis: CT FFR will not be performed for this study.   LABORATORY DATA: CBC Latest Ref Rng & Units 04/25/2019 08/22/2016 02/19/2016  WBC 4.0 - 10.5 K/uL 5.5 6.8 6.3  Hemoglobin 12.0 - 15.0 g/dL 12.1 11.9(L) 12.4  Hematocrit 36.0 - 46.0 % 38.6 37.4 38.5  Platelets 150 - 400 K/uL 191 198 191    CMP Latest Ref Rng & Units 01/30/2020 04/25/2019 08/22/2016  Glucose 65 - 99 mg/dL 123(H) 124(H) 175(H)  BUN 8 - 27 mg/dL _0 Creatinine 0.57 - 1.00 mg/dL 0.73 0.58 0.77  Sodium 134 - 144 mmol/L 139 139 137  Potassium 3.5 - 5.2 mmol/L 4.5 3.9 3.8  Chloride 96 - 106 mmol/L 102 105 104  CO2 20 - 29 mmol/L 19(L) 26 24  Calcium 8.7 - 10.3 mg/dL 9.6 9.0 8.8(L)  Total Protein 6.5 - 8.1 g/dL - 7.6 -  Total Bilirubin 0.3 - 1.2 mg/dL - 0.6 -  Alkaline Phos 38 - 126 U/L - 102 -  AST 15 - 41 U/L - 18 -  ALT 0 - 44 U/L - 22 -    Lipid Panel     Component Value Date/Time   CHOL 197 06/04/2016 1114   TRIG 73.0 06/04/2016 1114   HDL 56.00 06/04/2016 1114   CHOLHDL 4 06/04/2016 1114   VLDL 14.6 06/04/2016 1114   LDLCALC 126 (H) 06/04/2016 1114    Lab Results  Component Value Date   HGBA1C 9.2 (H) 06/04/2016   HGBA1C 7.9 (H) 02/17/2016   HGBA1C 7.3 07/24/2015   No components found for: NTPROBNP Lab Results  Component Value Date   TSH 1.290 05/22/2019   TSH 1.35 07/24/2015   Cardiac Panel (last 3 results) No results for input(s): CKTOTAL, CKMB, TROPONINIHS,  RELINDX in the last 72 hours.  TSH No results for input(s): TSH in the last 8760 hours.  External Labs: Collected: 12/19/2020 [facility] available in Care Everywhere Total cholesterol 150, triglycerides 74, HDL 73, non-HDL 77, LDL direct 65. Hemoglobin A1c 7 Sodium 140, potassium 4.1, chloride 103, bicarb 28, BUN 13, creatinine 0.71 AST 16, ALT 16, alkaline phosphatase 78 eGFR >60   IMPRESSION:  ICD-10-CM   1. Coronary artery calcification of native artery  I25.10 aspirin EC 81 MG tablet   I25.84     2. Precordial pain  R07.2     3. Dyspnea on exertion  R06.09     4. Dyslipidemia, goal LDL below 70  E78.5     5. Essential hypertension  I10     6. Obstructive sleep apnea syndrome  G47.33     7. Type 2 diabetes mellitus with hyperglycemia, without long-term current use of insulin (HCC)  E11.65     8. Former smoker  Z87.891        RECOMMENDATIONS: Kylie Wilson is a 70 y.o. female whose past medical history and cardiac risk factors include: Obesity due to excess calories, OSA not on CPAP, dyslipidemia, hypertension, non-insulin-dependent diabetes mellitus type 2, coronary artery calcification, postmenopausal female, advanced age, and former smoker.  Precordial pain: Predominately noncardiac based on symptoms.   Nuclear stress test in 2021-low risk study Coronary CTA performed at [facility] 07/2020 notes mild nonobstructive CAD involving the distal LAD distribution with moderate coronary artery calcification at total CAC of 189 AU. Has stopped taking Toprol-XL for reasons mentioned above.  However I suspect that this was most likely secondary to taking all her hypertensive medications in the morning.  I have asked her to take her diuretics in the morning along with metoprolol and taking her amlodipine/olmesartan at night. We did discuss going forward with additional testing; however, patient would like to hold off at this  time.  Dyspnea on exertion: Multifactorial: Hypertension, deconditioning secondary to obesity, obesity hypoventilatory syndrome, etc. Blood pressure management per primary team.   Patient was noted to have lung findings and prior imaging study -I have asked her to review this with her PCP and if warranted see pulmonary medicine.   Patient has undergone a very appropriate ischemic work-up including nuclear stress test and coronary CTA results noted above and reviewed as part of this decision making process  Coronary artery calcification: Total CAC 189 AU Start aspirin 81 mg p.o. daily as she has stopped taking BC powder.   Continue statin therapy.  Benign essential hypertension: Home blood pressures are better controlled.   Medications reconciled.   We emphasized the importance of a low-salt diet.   Known history of OSA but currently not using CPAP.  Educated on the importance of addressing her underlying sleep apnea.  Patient is willing to proceed with sleep medicine evaluation and is requesting a referral.  She was referred to Dr. Elenore Rota at the last office visit but states that has not had the initial consultation.  I will have the office reach out to Dr. Patton Salles office to have it set up.  Obstructive sleep apnea not on CPAP: See above  Dyslipidemia: Continue rosuvastatin. Does not endorse myalgias. Currently managed by primary care provider.  Most recent labs from care everywhere reviewed dated 12/2020.  Labs have been summarized and noted above for further reference.  FINAL MEDICATION LIST END OF ENCOUNTER: Meds ordered this encounter  Medications   aspirin EC 81 MG tablet    Sig: Take 1 tablet (81 mg total) by mouth daily. Swallow whole.    Dispense:  30 tablet    Refill:  11     Current Outpatient Medications:    ACCU-CHEK AVIVA PLUS test strip, USE TO TEST BLOOD SUGARS DAILY., Disp: 100 each, Rfl: 1   amlodipine-olmesartan (AZOR) 10-20 MG tablet, Take 1 tablet by mouth  daily., Disp: , Rfl:  Ascorbic Acid (VITAMIN C) 1000 MG tablet, Take 1,000 mg by mouth daily., Disp: , Rfl:    aspirin EC 81 MG tablet, Take 1 tablet (81 mg total) by mouth daily. Swallow whole., Disp: 30 tablet, Rfl: 11   Blood Glucose Monitoring Suppl (GLUCOCOM BLOOD GLUCOSE MONITOR) DEVI, One Touch Verio Meter. FSBS BID. E11.319, Disp: , Rfl:    Cholecalciferol (VITAMIN D3 PO), Take 5,000 Units by mouth daily., Disp: , Rfl:    dorzolamide-timolol (COSOPT) 22.3-6.8 MG/ML ophthalmic solution, Place 1 drop into both eyes 2 (two) times daily., Disp: , Rfl:    empagliflozin (JARDIANCE) 25 MG TABS tablet, Take 1 tablet by mouth daily., Disp: , Rfl:    ferrous sulfate 325 (65 FE) MG tablet, Take by mouth., Disp: , Rfl:    hydrochlorothiazide (HYDRODIURIL) 25 MG tablet, TAKE 1 TABLET BY MOUTH EVERY DAY IN THE MORNING, Disp: 90 tablet, Rfl: 0   Lancets (ONETOUCH DELICA PLUS [id number]) MISC, Apply topically., Disp: , Rfl:    LINZESS 72 MCG capsule, Take 72 mcg by mouth daily as needed (abdominal cramping). , Disp: , Rfl:    metFORMIN (GLUCOPHAGE) 500 MG tablet, Take 1,000 mg by mouth 2 (two) times daily with a meal. , Disp: , Rfl:    pantoprazole (PROTONIX) 40 MG tablet, Take 40 mg by mouth daily., Disp: , Rfl:    rosuvastatin (CRESTOR) 10 MG tablet, Take 1 tablet (10 mg total) by mouth at bedtime for 90 doses., Disp: 90 tablet, Rfl: 0  No orders of the defined types were placed in this encounter.  --Continue cardiac medications as reconciled in final medication list. --Return in about 3 months (around 05/12/2021) for Follow up, Chest pain. Or sooner if needed. --Continue follow-up with your primary care physician regarding the management of your other chronic comorbid conditions.  Patient's questions and concerns were addressed to her satisfaction. She voices understanding of the instructions provided during this encounter.   This note was created using a voice recognition software as a result  there may be grammatical errors inadvertently enclosed that do not reflect the nature of this encounter. Every attempt is made to correct such errors.  Total time spent: 31 minutes.  Rex Kras, [place], [facility]  Pager: [phone] Office: [phone]

## 2021-03-13 NOTE — Progress Notes (Shared)
[facility] - Clinic Note  03/16/2021     CHIEF COMPLAINT Patient presents for No chief complaint on file.    HISTORY OF PRESENT ILLNESS: Kylie Wilson is a 70 y.o. female who presents to the clinic today for:     Pt states if she had to rely on her left eye alone she doesn't feel like she would be able to drive  Referring physician: Truett Perna, MD [id number] [place],  [place] [address]  HISTORICAL INFORMATION:   Selected notes from the MEDICAL RECORD NUMBER Previous [name] pt (2016) self-referred for possible RD LEE:  Ocular Hx- PCV diagnosed by Dr. Dorette Grate at [facility]; followed w/ Dr. Rhina Brackett for DM PMH-    CURRENT MEDICATIONS: Current Outpatient Medications (Ophthalmic Drugs)  Medication Sig   dorzolamide-timolol (COSOPT) 22.3-6.8 MG/ML ophthalmic solution Place 1 drop into both eyes 2 (two) times daily.   No current facility-administered medications for this visit. (Ophthalmic Drugs)   Current Outpatient Medications (Other)  Medication Sig   ACCU-CHEK AVIVA PLUS test strip USE TO TEST BLOOD SUGARS DAILY.   amlodipine-olmesartan (AZOR) 10-20 MG tablet Take 1 tablet by mouth daily.   Ascorbic Acid (VITAMIN C) 1000 MG tablet Take 1,000 mg by mouth daily.   aspirin EC 81 MG tablet Take 1 tablet (81 mg total) by mouth daily. Swallow whole.   Blood Glucose Monitoring Suppl (GLUCOCOM BLOOD GLUCOSE MONITOR) DEVI One Touch Verio Meter. FSBS BID. E11.319   Cholecalciferol (VITAMIN D3 PO) Take 5,000 Units by mouth daily.   empagliflozin (JARDIANCE) 25 MG TABS tablet Take 1 tablet by mouth daily.   ferrous sulfate 325 (65 FE) MG tablet Take by mouth.   hydrochlorothiazide (HYDRODIURIL) 25 MG tablet TAKE 1 TABLET BY MOUTH EVERY DAY IN THE MORNING   Lancets (ONETOUCH DELICA PLUS LANCET33G) MISC Apply topically.   LINZESS 72 MCG capsule Take 72 mcg by mouth daily as needed (abdominal cramping).    metFORMIN (GLUCOPHAGE) 500 MG tablet  Take 1,000 mg by mouth 2 (two) times daily with a meal.    pantoprazole (PROTONIX) 40 MG tablet Take 40 mg by mouth daily.   rosuvastatin (CRESTOR) 10 MG tablet Take 1 tablet (10 mg total) by mouth at bedtime for 90 doses.   No current facility-administered medications for this visit. (Other)   REVIEW OF SYSTEMS:    ALLERGIES Allergies  Allergen Reactions   Duloxetine Other (See Comments)    Pt says it also caused high pressure.   Lisinopril Other (See Comments)    cough   PAST MEDICAL HISTORY Past Medical History:  Diagnosis Date   Arthritis    Barrett's esophagus    Burning pain    Cervical radiculopathy    Cervical spondylosis    Coronary artery calcification    DDD (degenerative disc disease), cervical    Diabetes mellitus    type 2    GERD (gastroesophageal reflux disease)    Hyperlipidemia    Hypertension    Hypertensive retinopathy    OU   Macular degeneration    Dry OD; Wet OS   Obesity    Rectocele    Sleep apnea    does not tolerate cpap   Uterine prolapse    Past Surgical History:  Procedure Laterality Date   BREAST BIOPSY  1980   left   BREAST EXCISIONAL BIOPSY Left    Bening 1980's   CATARACT EXTRACTION Bilateral    Dr. Randon Goldsmith   EYE SURGERY  Bilateral    Cat Sx - Dr. Randon Goldsmith   SCAR REVISION  2001   both legs   TOE SURGERY  1981   4th and 5th toe right foot   TOE SURGERY  2002   great toe left   TONSILLECTOMY     TUBAL LIGATION      FAMILY HISTORY Family History  Problem Relation Age of Onset   Heart disease Father    Pancreatic cancer Father    Breast cancer Paternal Aunt    Stroke Mother    Heart disease Brother    Colon cancer Neg Hx    Stomach cancer Neg Hx    Colon polyps Neg Hx    Rectal cancer Neg Hx    SOCIAL HISTORY Social History   Tobacco Use   Smoking status: Former    Packs/day: 0.80    Years: 30.00    Pack years: 24.00    Types: Cigarettes    Quit date: 09/15/2011    Years since quitting: 9.4   Smokeless  tobacco: Never   Tobacco comments:    smoked 20 years; then quit 10; smoked 30 years plus  Vaping Use   Vaping Use: Never used  Substance Use Topics   Alcohol use: Yes    Comment: Every evening, 3 glasses of wine    Drug use: No       OPHTHALMIC EXAM: Not recorded    IMAGING AND PROCEDURES  Imaging and Procedures for 03/16/2021           ASSESSMENT/PLAN:   ICD-10-CM   1. Exudative age-related macular degeneration of left eye with active choroidal neovascularization (HCC)  H35.3221     2. 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 Essential hypertension  I10     6. Hypertensive retinopathy of both eyes  H35.033     7. Pseudophakia, both eyes  Z96.1        1-3. Idiopathic polypoidal choroidal vasculopathy (PCV) OU  - Exudative age related macular degeneration, OS  - Nonexudative ARMD OD  - was seen in 2016 here by JDM  - transferred care to [facility] w/ Dr. Dorette Grate -- received intravitreal injxns  - then transferred to Dr. Quinn Axe -- last visit 07/2019 -- IVE OD #7 for DME  - has seen Dr. Sherryll Burger twice  - FA (11.29.21) shows peripapillary CNV OS + peripheral leakage OU consistent w/ PCV  - ICG (02.28.22) shows polypoidal choroidal vasculopathy (PCV); Focal hyper cyanscence superior to disc -- cluster of polyps   - here, s/p IVA OS #1 (11.29.21), #2 (12.31.21), #3 (01.31.22), #4 (02.28.22), #5 (03.30.22), #6 (04.27.22), #7 (05.27.22), # 8 (06.24.22), #9 (07.25.22), #10 (08.29.22--JDM), #11 (09.26.22), #12 (10.24.22), #13 (11.28.232)  - OCT shows peripapillary CNV w/ interval improvement in SRF overlying PED/CNV OS at 5 wks; OD without exudative disease  - discussed possible IVA resistance OS -- but good response today  - BCVA 20/20 OD; 20/30 OS  - recommend IVA OS #14 today, 01.02.23 w/ f/u in 5 wks again  - pt wishes to  proceed  - RBA of procedure discussed, questions answered  - informed consent obtained and signed  - see procedure note - informed consent obtained, signed and scanned, 11.29.21 (IVA OS) - see procedure note - IVE authorized by [facility], but [facility] no longer taking new pts  for 2022  - f/u in 5 wks -- DFE/OCT, possible injection--scan through schisis OS (0200-0430)  4. Retinoschisis OS  - focal, bullous schisis cavity in temporal periphery 2-430  - widefield OCT confirms schisis w/o RD  - stable  - continue monitoring for now  - may need laser if retinoschisis progresses posteriorly  5,6. Hypertensive retinopathy OU - discussed importance of tight BP control - monitor  7. Pseudophakia OU  - s/p CE/IOL (Dr. Randon Goldsmith)  - IOL in good position, doing well - monitor   Ophthalmic Meds Ordered this visit:  No orders of the defined types were placed in this encounter.    No follow-ups on file.  There are no Patient Instructions on file for this visit.  This document serves as a record of services personally performed by Karie Chimera, MD, PhD. It was created on their behalf by Annalee Genta, COMT. The creation of this record is the provider's dictation and/or activities during the visit.  Electronically signed by: Annalee Genta, COMT 03/13/21 11:50 AM  Karie Chimera, M.D., Ph.D. Diseases & Surgery of the Retina and Vitreous [facility]     Abbreviations: M myopia (nearsighted); A astigmatism; H hyperopia (farsighted); P presbyopia; Mrx spectacle prescription;  CTL contact lenses; OD right eye; OS left eye; OU both eyes  XT exotropia; ET esotropia; PEK punctate epithelial keratitis; PEE punctate epithelial erosions; DES dry eye syndrome; MGD meibomian gland dysfunction; ATs artificial tears; PFAT's preservative free artificial tears; NSC nuclear sclerotic cataract; PSC posterior subcapsular cataract; ERM epi-retinal membrane; PVD posterior vitreous detachment;  RD retinal detachment; DM diabetes mellitus; DR diabetic retinopathy; NPDR non-proliferative diabetic retinopathy; PDR proliferative diabetic retinopathy; CSME clinically significant macular edema; DME diabetic macular edema; dbh dot blot hemorrhages; CWS cotton wool spot; POAG primary open angle glaucoma; C/D cup-to-disc ratio; HVF humphrey visual field; GVF goldmann visual field; OCT optical coherence tomography; IOP intraocular pressure; BRVO Branch retinal vein occlusion; CRVO central retinal vein occlusion; CRAO central retinal artery occlusion; BRAO branch retinal artery occlusion; RT retinal tear; SB scleral buckle; PPV pars plana vitrectomy; VH Vitreous hemorrhage; PRP panretinal laser photocoagulation; IVK intravitreal kenalog; VMT vitreomacular traction; MH Macular hole;  NVD neovascularization of the disc; NVE neovascularization elsewhere; AREDS age related eye disease study; ARMD age related macular degeneration; POAG primary open angle glaucoma; EBMD epithelial/anterior basement membrane dystrophy; ACIOL anterior chamber intraocular lens; IOL intraocular lens; PCIOL posterior chamber intraocular lens; Phaco/IOL phacoemulsification with intraocular lens placement; PRK photorefractive keratectomy; LASIK laser assisted in situ keratomileusis; HTN hypertension; DM diabetes mellitus; COPD chronic obstructive pulmonary disease

## 2021-03-16 ENCOUNTER — Encounter (INDEPENDENT_AMBULATORY_CARE_PROVIDER_SITE_OTHER): Payer: Commercial Managed Care - HMO | Admitting: Ophthalmology

## 2021-04-02 ENCOUNTER — Telehealth: Payer: Self-pay

## 2021-04-03 NOTE — Telephone Encounter (Signed)
Patient called back to discuss her concerns.  Patient states that she is doing better compared to her initial phone call.  The only concern that she is having is when she checks her vitals her heart rate has been less than 60 bpm however asymptomatic.  Given her concern recommended decreasing Toprol-XL to 12.5 mg p.o. daily.  We will update her records.  No charge.  Tessa Lerner, [place], [facility]  Pager: [phone] Office: [phone]

## 2021-04-03 NOTE — Telephone Encounter (Signed)
Called and left a voicemail.  Will try again.   Dr. Odis Hollingshead

## 2021-04-06 ENCOUNTER — Encounter (INDEPENDENT_AMBULATORY_CARE_PROVIDER_SITE_OTHER): Payer: Self-pay | Admitting: Ophthalmology

## 2021-04-06 ENCOUNTER — Other Ambulatory Visit: Payer: Self-pay

## 2021-04-06 ENCOUNTER — Ambulatory Visit (INDEPENDENT_AMBULATORY_CARE_PROVIDER_SITE_OTHER): Payer: Medicare Other | Admitting: Ophthalmology

## 2021-04-06 DIAGNOSIS — I10 Essential (primary) hypertension: Secondary | ICD-10-CM

## 2021-04-06 DIAGNOSIS — Z961 Presence of intraocular lens: Secondary | ICD-10-CM

## 2021-04-06 DIAGNOSIS — H353221 Exudative age-related macular degeneration, left eye, with active choroidal neovascularization: Secondary | ICD-10-CM

## 2021-04-06 DIAGNOSIS — H35033 Hypertensive retinopathy, bilateral: Secondary | ICD-10-CM

## 2021-04-06 DIAGNOSIS — H353112 Nonexudative age-related macular degeneration, right eye, intermediate dry stage: Secondary | ICD-10-CM | POA: Diagnosis not present

## 2021-04-06 DIAGNOSIS — H318 Other specified disorders of choroid: Secondary | ICD-10-CM | POA: Diagnosis not present

## 2021-04-06 DIAGNOSIS — H33102 Unspecified retinoschisis, left eye: Secondary | ICD-10-CM

## 2021-04-06 MED ORDER — BEVACIZUMAB CHEMO INJECTION 1.25MG/0.05ML SYRINGE FOR KALEIDOSCOPE
1.2500 mg | INTRAVITREAL | Status: AC | PRN
  Administered 2021-04-06: 1.25 mg via INTRAVITREAL

## 2021-04-06 NOTE — Progress Notes (Addendum)
[facility] - Clinic Note  04/06/2021     CHIEF COMPLAINT Patient presents for Retina Follow Up    HISTORY OF PRESENT ILLNESS: Kylie Wilson is a 71 y.o. female who presents to the clinic today for:   HPI     Retina Follow Up   Patient presents with  Wet AMD.  In left eye.  This started 5 weeks ago.  I, the attending physician,  performed the HPI with the patient and updated documentation appropriately.        Comments   Patient here for 5 weeks retina follow up for exu ARMD OS. Patient states vision about the same. No worse. No better. Sees a few lights in OS. From tear. No eye pain.       Last edited by Kylie Chris, MD on 04/07/2021 10:50 PM.     Pt is delayed to follow up from 5 weeks to 8 weeks due to insurance purposes, Pt states she had her regular eye exam and got a rx for new glasses  Referring physician: Truett Perna, MD [id number] [place],  [place] [address]  HISTORICAL INFORMATION:   Selected notes from the MEDICAL RECORD NUMBER Previous Kylie Wilson pt (2016) self-referred for possible RD LEE:  Ocular Hx- PCV diagnosed by Dr. Dorette Wilson at [facility]; followed w/ Dr. Rhina Wilson for DM PMH-    CURRENT MEDICATIONS: Current Outpatient Medications (Ophthalmic Drugs)  Medication Sig   dorzolamide-timolol (COSOPT) 22.3-6.8 MG/ML ophthalmic solution Place 1 drop into both eyes 2 (two) times daily.   No current facility-administered medications for this visit. (Ophthalmic Drugs)   Current Outpatient Medications (Other)  Medication Sig   ACCU-CHEK AVIVA PLUS test strip USE TO TEST BLOOD SUGARS DAILY.   amlodipine-olmesartan (AZOR) 10-20 MG tablet Take 1 tablet by mouth daily.   Ascorbic Acid (VITAMIN C) 1000 MG tablet Take 1,000 mg by mouth daily.   aspirin EC 81 MG tablet Take 1 tablet (81 mg total) by mouth daily. Swallow whole.   Blood Glucose Monitoring Suppl (GLUCOCOM BLOOD GLUCOSE MONITOR) DEVI One Touch Verio Meter. FSBS  BID. E11.319   Cholecalciferol (VITAMIN D3 PO) Take 5,000 Units by mouth daily.   empagliflozin (JARDIANCE) 25 MG TABS tablet Take 1 tablet by mouth daily.   ferrous sulfate 325 (65 FE) MG tablet Take by mouth.   hydrochlorothiazide (HYDRODIURIL) 25 MG tablet TAKE 1 TABLET BY MOUTH EVERY DAY IN THE MORNING   Lancets (ONETOUCH DELICA PLUS LANCET33G) MISC Apply topically.   LINZESS 72 MCG capsule Take 72 mcg by mouth daily as needed (abdominal cramping).    metFORMIN (GLUCOPHAGE) 500 MG tablet Take 1,000 mg by mouth 2 (two) times daily with a meal.    metoprolol succinate (TOPROL-XL) 25 MG 24 hr tablet Take 12.5 mg by mouth daily.   pantoprazole (PROTONIX) 40 MG tablet Take 40 mg by mouth daily.   rosuvastatin (CRESTOR) 10 MG tablet Take 1 tablet (10 mg total) by mouth at bedtime for 90 doses.   No current facility-administered medications for this visit. (Other)   REVIEW OF SYSTEMS: ROS   Positive for: Gastrointestinal, Neurological, Musculoskeletal, Endocrine, Cardiovascular, Eyes, Respiratory Negative for: Constitutional, Skin, Genitourinary, HENT, Psychiatric, Allergic/Imm, Heme/Lymph Last edited by Kylie Wilson, COA on 04/06/2021  2:10 PM.      ALLERGIES Allergies  Allergen Reactions   Duloxetine Other (See Comments)    Pt says it also caused high pressure.   Lisinopril Other (See Comments)  cough   PAST MEDICAL HISTORY Past Medical History:  Diagnosis Date   Arthritis    Barrett's esophagus    Burning pain    Cervical radiculopathy    Cervical spondylosis    Coronary artery calcification    DDD (degenerative disc disease), cervical    Diabetes mellitus    type 2    GERD (gastroesophageal reflux disease)    Hyperlipidemia    Hypertension    Hypertensive retinopathy    OU   Macular degeneration    Dry OD; Wet OS   Obesity    Rectocele    Sleep apnea    does not tolerate cpap   Uterine prolapse    Past Surgical History:  Procedure Laterality Date    BREAST BIOPSY  1980   left   BREAST EXCISIONAL BIOPSY Left    Bening 1980's   CATARACT EXTRACTION Bilateral    Dr. Randon Wilson   EYE SURGERY Bilateral    Cat Sx - Dr. Randon Wilson   SCAR REVISION  2001   both legs   TOE SURGERY  1981   4th and 5th toe right foot   TOE SURGERY  2002   great toe left   TONSILLECTOMY     TUBAL LIGATION      FAMILY HISTORY Family History  Problem Relation Age of Onset   Heart disease Father    Pancreatic cancer Father    Breast cancer Paternal Aunt    Stroke Mother    Heart disease Brother    Colon cancer Neg Hx    Stomach cancer Neg Hx    Colon polyps Neg Hx    Rectal cancer Neg Hx    SOCIAL HISTORY Social History   Tobacco Use   Smoking status: Former    Packs/day: 0.80    Years: 30.00    Pack years: 24.00    Types: Cigarettes    Quit date: 09/15/2011    Years since quitting: 9.5   Smokeless tobacco: Never   Tobacco comments:    smoked 20 years; then quit 10; smoked 30 years plus  Vaping Use   Vaping Use: Never used  Substance Use Topics   Alcohol use: Yes    Comment: Every evening, 3 glasses of wine    Drug use: No       OPHTHALMIC EXAM: Base Eye Exam     Visual Acuity (Snellen - Linear)       Right Left   Dist cc 20/20 20/25   Dist ph cc  NI         Tonometry (Tonopen, 2:07 PM)       Right Left   Pressure 12 12         Pupils       Dark Light Shape React APD   Right 2 1 Round Minimal None   Left 2 1 Round Minimal None         Visual Fields (Counting fingers)       Left Right    Full Full         Extraocular Movement       Right Left    Full, Ortho Full, Ortho         Neuro/Psych     Oriented x3: Yes   Mood/Affect: Normal         Dilation     Both eyes: 1.0% Mydriacyl, 2.5% Phenylephrine @ 2:07 PM           Slit Lamp and  Fundus Exam     Slit Lamp Exam       Right Left   Lids/Lashes Dermatochalasis - upper lid, Meibomian gland dysfunction Dermatochalasis - upper lid, Meibomian  gland dysfunction   Conjunctiva/Sclera Melanosis, 1+ Injection Melanosis, 1+ Injection   Cornea trace Punctate epithelial erosions, arcus, high tear lake 1+Punctate epithelial erosions, arcus, high tear lake   Anterior Chamber Deep and quiet Deep and quiet   Iris Round and moderately dilated, No NVI Round and moderately dilated to 5.40mm, No NVI   Lens PC IOL in good position with open PC PC IOL in good position with open PC   Anterior Vitreous Vitreous syneresis, scattered silicone oil bubbles Vitreous syneresis, silicone oil bubbles         Fundus Exam       Right Left   Disc Trace Pallor, +cupping, Thin inferior rim mild pallor, Sharp rim, Peripapillary atrophy, Thin inferior rim   C/D Ratio 0.7 0.6   Macula Flat, Blunted foveal reflex, Drusen, RPE mottling and clumping, No heme or edema good foveal reflex, RPE clumping and atrophy, peripapillary SRF/edema SN macula - improved, +ERM, pigmented atrophy/scarring IN macula, no heme   Vessels attenuated, Tortuous attenuated, mild tortuousity   Periphery Attached    Attached, bullous schisis cavity from 0200-0430 -- stable; no RT/RD           Refraction     Wearing Rx       Sphere Cylinder Axis Add   Right -0.75 +2.00 163 +1.75   Left +0.00 +1.25 169 +1.75           IMAGING AND PROCEDURES  Imaging and Procedures for 04/06/2021  OCT, Retina - OU - Both Eyes       Right Eye Quality was good. Central Foveal Thickness: 215. Progression has been stable. Findings include no IRF, no SRF, retinal drusen , outer retinal atrophy, vitreomacular adhesion , normal foveal contour, intraretinal hyper-reflective material.   Left Eye Quality was good. Central Foveal Thickness: 228. Progression has improved. Findings include normal foveal contour, no IRF, retinal drusen , subretinal hyper-reflective material, pigment epithelial detachment, choroidal neovascular membrane, outer retinal atrophy, vitreomacular adhesion , no SRF (Interval  improvement in SRF overlying PED superior to disc ).   Notes *Images captured and stored on drive  Diagnosis / Impression:  OD: nonexudative ARMD OS: Interval improvement in SRF overlying PED superior to disc   Clinical management:  See below  Abbreviations: NFP - Normal foveal profile. CME - cystoid macular edema. PED - pigment epithelial detachment. IRF - intraretinal fluid. SRF - subretinal fluid. EZ - ellipsoid zone. ERM - epiretinal membrane. ORA - outer retinal atrophy. ORT - outer retinal tubulation. SRHM - subretinal hyper-reflective material. IRHM - intraretinal hyper-reflective material      Intravitreal Injection, Pharmacologic Agent - OS - Left Eye       Time Out 04/06/2021. 2:47 PM. Confirmed correct patient, procedure, site, and patient consented.   Anesthesia Topical anesthesia was used. Anesthetic medications included Lidocaine 2%, Proparacaine 0.5%.   Procedure Preparation included 5% betadine to ocular surface, eyelid speculum. A (32g) needle was used.   Injection: 1.25 mg Bevacizumab 1.25mg /0.76ml   Route: Intravitreal, Site: Left Eye   NDC: [id number], Lot: [id number], Expiration date: 04/23/2021, Waste: 0.05 mL   Post-op Post injection exam found visual acuity of at least counting fingers. The patient tolerated the procedure well. There were no complications. The patient received written and verbal post procedure care education.  ASSESSMENT/PLAN:   ICD-10-CM   1. Exudative age-related macular degeneration of left eye with active choroidal neovascularization (HCC)  H35.3221 OCT, Retina - OU - Both Eyes    Intravitreal Injection, Pharmacologic Agent - OS - Left Eye    Bevacizumab (AVASTIN) SOLN 1.25 mg    2. 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 Essential hypertension  I10     6.  Hypertensive retinopathy of both eyes  H35.033     7. Pseudophakia, both eyes  Z96.1      1-3. Idiopathic polypoidal choroidal vasculopathy (PCV) OU  - Exudative age related macular degeneration, OS  - Nonexudative ARMD OD  - was seen in 2016 here by JDM  - [facility] Doret[facility] GratePostel -- received intravitreal injxns  - then transferred to Dr. Quinn Axeadiochenko -- last visit 07/2019 -- IVE OD #7 for DME  - has seen Dr. Sherryll BurgerShah twice  - FA (11.29.21) shows peripapillary CNV OS + peripheral leakage OU consistent w/ PCV  - ICG (02.28.22) shows polypoidal choroidal vasculopathy (PCV); Focal hyper cyanscence superior to disc -- cluster of polyps   - here, s/p IVA OS #1 (11.29.21), #2 (12.31.21), #3 (01.31.22), #4 (02.28.22), #5 (03.30.22), #6 (04.27.22), #7 (05.27.22), # 8 (06.24.22), #9 (07.25.22), #10 (08.29.22--JDM), #11 (09.26.22), #12 (10.24.22), #13 (11.28.22)  **delayed f/u to 8 wks instead of 5  - OCT shows OS-Interval improvement in SRF overlying PED superior to disc at 8 wks; OD without exudative disease  - BCVA 20/20 OD; 20/25 OS  - recommend IVA OS #13 today, 11.28.22 w/ f/u in 8 wks  - pt wishes to proceed  - RBA of procedure discussed, questions answered  - informed consent obtained and signed  - see procedure note - informed consent obtained, signed and scanned, 11.29.21 (IVA OS) - see procedure note - [facility], but [facility] no longer taking new pts for 2022  - f/u in 8 wks -- DFE/OCT, possible injection--scan through schisis OS (0200-0430)  4. Retinoschisis OS  - focal, bullous schisis cavity in temporal periphery 2-430  - widefield OCT confirms schisis w/o RD  - stable  - continue monitoring for now  - may need laser if retinoschisis progresses posteriorly  5,6. Hypertensive retinopathy OU - discussed importance of tight BP control - monitor  7. Pseudophakia OU  - s/p CE/IOL (Dr. Randon GoldsmithLyles)  - IOL in good position, doing well - monitor   Ophthalmic  Meds Ordered this visit:  Meds ordered this encounter  Medications   Bevacizumab (AVASTIN) SOLN 1.25 mg     Return in about 8 weeks (around 06/01/2021) for f/u exu ARMD OS, DFE, OCT.  There are no Patient Instructions on file for this visit.  This document serves as a record of services personally performed by Karie ChimeraBrian G. Emoree Sasaki, MD, PhD. It was created on their behalf by Glee ArvinAmanda J. Manson PasseyBrown, OA an ophthalmic technician. The creation of this record is the provider's dictation and/or activities during the visit.    Electronically signed by: Glee ArvinAmanda J. Manson PasseyBrown, [place] 01.23.2023 11:04 PM  Karie ChimeraBrian G. Erby Sanderson, M.D., Ph.D. Diseases & Surgery of the Retina and Vitreous [facility]  I have reviewed the above documentation for accuracy and completeness, and I agree with the above. Karie ChimeraBrian G. Evangelynn Lochridge, M.D., Ph.D. 04/07/21 11:04 PM  Abbreviations: M myopia (nearsighted); A astigmatism; H hyperopia (farsighted); P presbyopia; Mrx spectacle prescription;  CTL contact lenses; OD right eye; OS left eye; OU both eyes  XT exotropia; ET esotropia; PEK punctate epithelial keratitis; PEE punctate epithelial erosions; DES dry eye syndrome; MGD meibomian gland dysfunction; ATs artificial tears; PFAT's preservative free artificial tears; NSC nuclear sclerotic cataract; PSC posterior subcapsular cataract; ERM epi-retinal membrane; PVD posterior vitreous detachment; RD retinal detachment; DM diabetes mellitus; DR diabetic retinopathy; NPDR non-proliferative diabetic retinopathy; PDR proliferative diabetic retinopathy; CSME clinically significant macular edema; DME diabetic macular edema; dbh dot blot hemorrhages; CWS cotton wool spot; POAG primary open angle glaucoma; C/D cup-to-disc ratio; HVF humphrey visual field; GVF goldmann visual field; OCT optical coherence tomography; IOP intraocular pressure; BRVO Branch retinal vein occlusion; CRVO central retinal vein occlusion; CRAO central retinal artery occlusion;  BRAO branch retinal artery occlusion; RT retinal tear; SB scleral buckle; PPV pars plana vitrectomy; VH Vitreous hemorrhage; PRP panretinal laser photocoagulation; IVK intravitreal kenalog; VMT vitreomacular traction; MH Macular hole;  NVD neovascularization of the disc; NVE neovascularization elsewhere; AREDS age related eye disease study; ARMD age related macular degeneration; POAG primary open angle glaucoma; EBMD epithelial/anterior basement membrane dystrophy; ACIOL anterior chamber intraocular lens; IOL intraocular lens; PCIOL posterior chamber intraocular lens; Phaco/IOL phacoemulsification with intraocular lens placement; PRK photorefractive keratectomy; LASIK laser assisted in situ keratomileusis; HTN hypertension; DM diabetes mellitus; COPD chronic obstructive pulmonary disease

## 2021-05-04 ENCOUNTER — Ambulatory Visit (INDEPENDENT_AMBULATORY_CARE_PROVIDER_SITE_OTHER): Payer: Medicare Other | Admitting: Podiatry

## 2021-05-04 ENCOUNTER — Encounter: Payer: Self-pay | Admitting: Podiatry

## 2021-05-04 ENCOUNTER — Other Ambulatory Visit: Payer: Self-pay

## 2021-05-04 DIAGNOSIS — L84 Corns and callosities: Secondary | ICD-10-CM | POA: Diagnosis not present

## 2021-05-04 DIAGNOSIS — E119 Type 2 diabetes mellitus without complications: Secondary | ICD-10-CM

## 2021-05-04 DIAGNOSIS — M79674 Pain in right toe(s): Secondary | ICD-10-CM

## 2021-05-04 DIAGNOSIS — B351 Tinea unguium: Secondary | ICD-10-CM | POA: Diagnosis not present

## 2021-05-04 DIAGNOSIS — M79675 Pain in left toe(s): Secondary | ICD-10-CM | POA: Diagnosis not present

## 2021-05-04 DIAGNOSIS — M2041 Other hammer toe(s) (acquired), right foot: Secondary | ICD-10-CM

## 2021-05-04 DIAGNOSIS — L603 Nail dystrophy: Secondary | ICD-10-CM

## 2021-05-04 DIAGNOSIS — M2042 Other hammer toe(s) (acquired), left foot: Secondary | ICD-10-CM

## 2021-05-10 NOTE — Progress Notes (Signed)
ANNUAL DIABETIC FOOT EXAM  Subjective: Kylie Wilson presents today for annual diabetic foot examination.  Patient relates 23 year h/o diabetes.  Patient denies any h/o foot wounds.  Patient admits occasional symptoms of burning in feet.  Patient's blood sugar was 161 mg/dl today.   Risk factors:  former smoker, diabetes, HTN, CAD, hyperlipidemia.  Truett Perna, MD is patient's PCP. Last visit was April 24, 2021.  Past Medical History:  Diagnosis Date   Arthritis    Barrett's esophagus    Burning pain    Cervical radiculopathy    Cervical spondylosis    Coronary artery calcification    DDD (degenerative disc disease), cervical    Diabetes mellitus    type 2    GERD (gastroesophageal reflux disease)    Hyperlipidemia    Hypertension    Hypertensive retinopathy    OU   Macular degeneration    Dry OD; Wet OS   Obesity    Rectocele    Sleep apnea    does not tolerate cpap   Uterine prolapse    Patient Active Problem List   Diagnosis Date Noted   Osteoarthritis of thoracic spine 07/21/2020   Coronary artery calcification seen on CT scan 07/10/2020   Cervical radiculopathy 11/13/2019   Centrilobular emphysema (HCC) 11/06/2019   Pseudophakia of both eyes 09/18/2019   Myopia of both eyes 09/18/2019   Retinal edema 09/18/2019   Schatzki's ring 07/13/2019   Palpitations 05/22/2019   Fatigue 05/22/2019   Chest pain with moderate risk of acute coronary syndrome 04/27/2019   Chronic low back pain without sciatica 02/22/2019   Paresthesia 02/22/2019   PCO (posterior capsular opacification), left 11/14/2018   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 associated with type 2 diabetes mellitus (HCC) 09/14/2018   Bilateral ocular hypertension 08/18/2018   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   Lumbar disc disease 08/17/2018   Wheezing 08/01/2017   Diverticulosis of large intestine without hemorrhage 05/06/2017    History of adenomatous polyp of colon 04/25/2017   Former smoker 03/04/2017   Vocal cord polyp 01/13/2017   Vitamin D deficiency 10/15/2016   Refused pneumococcal vaccination 10/13/2016   Generalized osteoarthritis of multiple sites 07/16/2016   Low back pain at multiple sites 07/16/2016   Cervicalgia 07/16/2016   Chronic pain disorder 07/16/2016   COPD (chronic obstructive pulmonary disease) with chronic bronchitis (HCC) 04/11/2016   Type 2 diabetes mellitus with mild nonproliferative retinopathy of both eyes, without long-term current use of insulin (HCC) 02/17/2016   COPD (chronic obstructive pulmonary disease) (HCC) 02/17/2016   Obesity due to excess calories 02/17/2016   Chronic idiopathic constipation 03/20/2015   Morbid obesity with BMI of 45.0-49.9, adult (HCC) 03/20/2015   Unspecified constipation 09/22/2011   Background diabetic retinopathy (HCC) 09/09/2011   Exudative macular degeneration (HCC) 09/09/2011   Dyslipidemia, goal LDL below 70 08/27/2009   DEPRESSION 08/27/2009   Essential hypertension 08/27/2009   Sleep apnea 08/27/2009   DYSPNEA 08/27/2009   Major depressive disorder, single episode 08/27/2009   BARRETTS ESOPHAGUS 08/07/2009   GERD (gastroesophageal reflux disease) 08/07/2008   COUGH 08/07/2008   DIVERTICULOSIS OF COLON 11/17/2007   Past Surgical History:  Procedure Laterality Date   BREAST BIOPSY  1980   left   BREAST EXCISIONAL BIOPSY Left    Bening 1980's   CATARACT EXTRACTION Bilateral    Dr. Randon Goldsmith   EYE SURGERY Bilateral    Cat Sx - Dr. Jeneen Montgomery REVISION  2001  both legs   TOE SURGERY  1981   4th and 5th toe right foot   TOE SURGERY  2002   great toe left   TONSILLECTOMY     TUBAL LIGATION     Current Outpatient Medications on File Prior to Visit  Medication Sig Dispense Refill   pregabalin (LYRICA) 75 MG capsule Take by mouth.     ACCU-CHEK AVIVA PLUS test strip USE TO TEST BLOOD SUGARS DAILY. 100 each 1   amlodipine-olmesartan  (AZOR) 10-20 MG tablet Take 1 tablet by mouth daily.     Ascorbic Acid (VITAMIN C) 1000 MG tablet Take 1,000 mg by mouth daily.     aspirin EC 81 MG tablet Take 1 tablet (81 mg total) by mouth daily. Swallow whole. 30 tablet 11   Blood Glucose Monitoring Suppl (GLUCOCOM BLOOD GLUCOSE MONITOR) DEVI One Touch Verio Meter. FSBS BID. E11.319     Cholecalciferol (VITAMIN D3 PO) Take 5,000 Units by mouth daily.     dorzolamide-timolol (COSOPT) 22.3-6.8 MG/ML ophthalmic solution Place 1 drop into both eyes 2 (two) times daily.     empagliflozin (JARDIANCE) 25 MG TABS tablet Take 1 tablet by mouth daily.     ferrous sulfate 325 (65 FE) MG tablet Take by mouth.     hydrochlorothiazide (HYDRODIURIL) 25 MG tablet TAKE 1 TABLET BY MOUTH EVERY DAY IN THE MORNING (Patient taking differently: Take 12.5 mg by mouth daily.) 90 tablet 0   Lancets (ONETOUCH DELICA PLUS LANCET33G) MISC Apply topically.     LINZESS 72 MCG capsule Take 72 mcg by mouth daily as needed (abdominal cramping).      metFORMIN (GLUCOPHAGE) 500 MG tablet Take 1,000 mg by mouth 2 (two) times daily with a meal.      metoprolol succinate (TOPROL-XL) 25 MG 24 hr tablet Take 12.5 mg by mouth daily.     pantoprazole (PROTONIX) 40 MG tablet Take 40 mg by mouth daily.     pregabalin (LYRICA) 75 MG capsule Take 75 mg by mouth at bedtime.     rosuvastatin (CRESTOR) 10 MG tablet Take 1 tablet (10 mg total) by mouth at bedtime for 90 doses. 90 tablet 0   No current facility-administered medications on file prior to visit.    Allergies  Allergen Reactions   Duloxetine Other (See Comments)    Pt says it also caused high pressure.   Lisinopril Other (See Comments)    cough   Social History   Occupational History   Occupation: Retired    Associate Professor: UNEMPLOYED  Tobacco Use   Smoking status: Former    Packs/day: 0.80    Years: 30.00    Pack years: 24.00    Types: Cigarettes    Quit date: 09/15/2011    Years since quitting: 9.6   Smokeless  tobacco: Never   Tobacco comments:    smoked 20 years; then quit 10; smoked 30 years plus  Vaping Use   Vaping Use: Never used  Substance and Sexual Activity   Alcohol use: Yes    Comment: Every evening, 3 glasses of wine    Drug use: No   Sexual activity: Not on file   Family History  Problem Relation Age of Onset   Heart disease Father    Pancreatic cancer Father    Breast cancer Paternal Aunt    Stroke Mother    Heart disease Brother    Colon cancer Neg Hx    Stomach cancer Neg Hx    Colon polyps Neg  Hx    Rectal cancer Neg Hx    Immunization History  Administered Date(s) Administered   Moderna Sars-Covid-2 Vaccination 04/27/2020     Review of Systems: Negative except as noted in the HPI.   Objective: There were no vitals filed for this visit.  Kylie Wilson is a pleasant 71 y.o. female in NAD. AAO X 3.  Vascular Examination: Capillary refill time to digits immediate b/l. Palpable pedal pulses b/l LE. Pedal hair absent. No pain with calf compression b/l. Lower extremity skin temperature gradient within normal limits. No edema noted b/l LE. No ischemia or gangrene noted b/l LE. No cyanosis or clubbing noted b/l LE.  Dermatological Examination: Cracked nailplate noted distal medial aspect of right hallux extending into medial border. No break in skin. No open wounds b/l LE. No interdigital macerations noted b/l LE. Hyperkeratotic lesion(s) bilateral 5th toes.  No erythema, no edema, no drainage, no fluctuance.  Neurological Examination: Protective sensation intact 5/5 intact bilaterally with 10g monofilament b/l. Vibratory sensation intact b/l.  Musculoskeletal Examination: Normal muscle strength 5/5 to all lower extremity muscle groups bilaterally. Hammertoe(s) noted to the bilateral 5th toes.. No pain, crepitus or joint limitation noted with ROM b/l LE.  Patient ambulates independently without assistive aids.  Footwear Assessment: Does the patient wear  appropriate shoes? Yes. Does the patient need inserts/orthotics? No.  Assessment: 1. Pain due to onychomycosis of toenails of both feet   2. Corns   3. Cracked nails   4. Controlled type 2 diabetes mellitus without complication, without long-term current use of insulin (HCC)   5. Acquired hammertoes of both feet   6. Encounter for diabetic foot exam (HCC)     ADA Risk Categorization: Low Risk :  Patient has all of the following: Intact protective sensation No prior foot ulcer  No severe deformity Pedal pulses present  Plan: -Diabetic foot examination performed today. -Continue foot and shoe inspections daily. Monitor blood glucose per PCP/Endocrinologist's recommendations. -Mycotic toenails 1-5 bilaterally were debrided in length and girth with sterile nail nippers and dremel without incident. -Offending nail border debrided and curretaged R hallux utilizing sterile nail nipper and currette. Border(s) cleansed with alcohol and triple antibiotic ointment applied. Patient instructed to apply Neosporin with Pain Relief  to R hallux once daily for 7 days. -Corn(s) bilateral 5th toes pared utilizing sterile scalpel blade without complication or incident. Total number debrided=2. -Patient/POA to call should there be question/concern in the interim. Return in about 3 months (around 08/01/2021).  Freddie Breech, DPM

## 2021-05-12 ENCOUNTER — Other Ambulatory Visit: Payer: Self-pay

## 2021-05-12 ENCOUNTER — Ambulatory Visit: Payer: Medicare Other | Admitting: Cardiology

## 2021-05-12 ENCOUNTER — Encounter: Payer: Self-pay | Admitting: Cardiology

## 2021-05-12 VITALS — BP 124/74 | HR 76 | Temp 97.2°F | Resp 14 | Ht 64.0 in | Wt 254.4 lb

## 2021-05-12 DIAGNOSIS — Z6841 Body Mass Index (BMI) 40.0 and over, adult: Secondary | ICD-10-CM

## 2021-05-12 DIAGNOSIS — Z87891 Personal history of nicotine dependence: Secondary | ICD-10-CM

## 2021-05-12 DIAGNOSIS — G4733 Obstructive sleep apnea (adult) (pediatric): Secondary | ICD-10-CM

## 2021-05-12 DIAGNOSIS — E1165 Type 2 diabetes mellitus with hyperglycemia: Secondary | ICD-10-CM

## 2021-05-12 DIAGNOSIS — I10 Essential (primary) hypertension: Secondary | ICD-10-CM

## 2021-05-12 DIAGNOSIS — I2584 Coronary atherosclerosis due to calcified coronary lesion: Secondary | ICD-10-CM

## 2021-05-12 DIAGNOSIS — E785 Hyperlipidemia, unspecified: Secondary | ICD-10-CM

## 2021-05-12 DIAGNOSIS — I2510 Atherosclerotic heart disease of native coronary artery without angina pectoris: Secondary | ICD-10-CM

## 2021-05-12 NOTE — Progress Notes (Signed)
Date:  05/12/2021   ID:  [name], DOB [birth year], MRN [id number]  PCP:  Rudene Anda, MD  Cardiologist:  Rex Kras, DO, [facility] (established care 07/13/2019) Former Cardiology Providers: Dr. Skeet Latch and and Kerin Ransom PA-C  Date: 05/12/21 Last Office Visit: 02/09/2021  Chief Complaint  Patient presents with   Follow-up    Reevaluation of chest pain and palpitations   Chest Pain    HPI  Kylie Wilson is a 71 y.o. female whose past medical history and cardiovascular risk factors include: Obesity due to excess calories, OSA not on CPAP, dyslipidemia, hypertension, non-insulin-dependent diabetes mellitus type 2, moderate coronary artery calcification, mild nonobstructive CAD per coronary CTA, postmenopausal female, advanced age, and former smoker.  Here with a chief complaint of " reevaluation of chest pain and palpitations."  Prior to establishing care patient had a CT scan which noted coronary artery calcification and given her other cardiovascular risk factors including diabetes underwent nuclear stress test which was overall low risk study.  However, due to continued symptoms she had a coronary CTA at [facility] which noted moderate coronary artery calcification with mild nonobstructive CAD.  Since then patient has been educated on the importance of improving her modifiable cardiovascular risk factors and medical management.  Since last office visit she has not had any reoccurrence of chest pain.  She took my advice and had a GI evaluation and underwent EGD and colonoscopy.  Patient states that she was noted to have Barrett's esophagus, ulcer, and esophagitis.  She is currently being cared for by GI per patient.  At the last office visit I have encouraged her to stop using BC powder and patient states that for majority of the time she has not been using it.    ALLERGIES: Allergies  Allergen Reactions   Duloxetine Other (See Comments)    Pt says it also caused  high pressure.   Lisinopril Other (See Comments)    cough    MEDICATION LIST PRIOR TO VISIT: Current Meds  Medication Sig   ACCU-CHEK AVIVA PLUS test strip USE TO TEST BLOOD SUGARS DAILY.   amlodipine-olmesartan (AZOR) 10-20 MG tablet Take 1 tablet by mouth daily.   aspirin EC 81 MG tablet Take 1 tablet (81 mg total) by mouth daily. Swallow whole.   Blood Glucose Monitoring Suppl (GLUCOCOM BLOOD GLUCOSE MONITOR) DEVI One Touch Verio Meter. FSBS BID. E11.319   Cholecalciferol (VITAMIN D3 PO) Take 5,000 Units by mouth daily.   dorzolamide-timolol (COSOPT) 22.3-6.8 MG/ML ophthalmic solution Place 1 drop into both eyes 2 (two) times daily.   empagliflozin (JARDIANCE) 25 MG TABS tablet Take 1 tablet by mouth daily.   hydrochlorothiazide (HYDRODIURIL) 25 MG tablet TAKE 1 TABLET BY MOUTH EVERY DAY IN THE MORNING (Patient taking differently: Take 12.5 mg by mouth daily.)   Lancets (ONETOUCH DELICA PLUS [id number]) MISC Apply topically.   LINZESS 72 MCG capsule Take 72 mcg by mouth daily as needed (abdominal cramping).    metFORMIN (GLUCOPHAGE) 500 MG tablet Take 1,000 mg by mouth 2 (two) times daily with a meal.    metoprolol succinate (TOPROL-XL) 25 MG 24 hr tablet Take 12.5 mg by mouth daily.   omeprazole (PRILOSEC) 40 MG capsule Take 40 mg by mouth every morning.   pregabalin (LYRICA) 75 MG capsule Take by mouth.   rosuvastatin (CRESTOR) 10 MG tablet Take 1 tablet (10 mg total) by mouth at bedtime for 90 doses.     PAST MEDICAL HISTORY: Past Medical History:  Diagnosis Date   Arthritis    Barrett's esophagus    Burning pain    Cervical radiculopathy    Cervical spondylosis    Coronary artery calcification    DDD (degenerative disc disease), cervical    Diabetes mellitus    type 2    GERD (gastroesophageal reflux disease)    Hyperlipidemia    Hypertension    Hypertensive retinopathy    OU   Macular degeneration    Dry OD; Wet OS   Obesity    Rectocele    Sleep apnea    does  not tolerate cpap   Uterine prolapse     PAST SURGICAL HISTORY: Past Surgical History:  Procedure Laterality Date   BREAST BIOPSY  1980   left   BREAST EXCISIONAL BIOPSY Left    Bening 1980's   CATARACT EXTRACTION Bilateral    Dr. Prudencio Burly   EYE SURGERY Bilateral    Cat Sx - Dr. Prudencio Burly   SCAR REVISION  2001   both legs   TOE SURGERY  1981   4th and 5th toe right foot   TOE SURGERY  2002   great toe left   TONSILLECTOMY     TUBAL LIGATION      FAMILY HISTORY: The patient family history includes Breast cancer in her paternal aunt; Heart disease in her brother and father; Pancreatic cancer in her father; Stroke in her mother.  SOCIAL HISTORY:  The patient  reports that she quit smoking about 9 years ago. Her smoking use included cigarettes. She has a 24.00 pack-year smoking history. She has never used smokeless tobacco. She reports current alcohol use. She reports that she does not use drugs.  REVIEW OF SYSTEMS: Review of Systems  Cardiovascular:  Negative for chest pain, dyspnea on exertion, leg swelling, orthopnea, palpitations, paroxysmal nocturnal dyspnea and syncope.  Respiratory:  Positive for shortness of breath (better).    PHYSICAL EXAM: Vitals with BMI 05/12/2021 02/09/2021 11/07/2020  Height _0  _1  -  Weight 254 lbs 6 oz 255 lbs -  BMI 58.83 25.49 -  Systolic [phone]  Diastolic 74 70 66  Pulse 76 69 62   CONSTITUTIONAL: Well-developed and well-nourished. No acute distress.  SKIN: Skin is warm and dry. No rash noted. No cyanosis. No pallor. No jaundice HEAD: Normocephalic and atraumatic.  EYES: No scleral icterus MOUTH/THROAT: Moist oral membranes.  NECK: No JVD present. No thyromegaly noted. No carotid bruits  LYMPHATIC: No visible cervical adenopathy.  CHEST Normal respiratory effort. No intercostal retractions  LUNGS: Clear to auscultation bilaterally no stridor. No wheezes. No rales.  CARDIOVASCULAR: Regular rate and rhythm, positive S1-S2, no  murmurs rubs or gallops appreciated. ABDOMINAL: Obese, soft, nontender, nondistended, positive bowel sounds all 4 quadrants. No apparent ascites.  EXTREMITIES: No peripheral edema  HEMATOLOGIC: No significant bruising NEUROLOGIC: Oriented to person, place, and time. Nonfocal. Normal muscle tone.  PSYCHIATRIC: Normal mood and affect. Normal behavior. Cooperative  CARDIAC DATABASE: EKG: 05/12/2021: NSR, 69 bpm, normal axis, without underlying ischemia or injury pattern.  Echocardiogram: 07/23/2019:  Normal LV systolic function with visual EF 65-70%. Left ventricle cavity is normal in size. Mild concentric hypertrophy of the left ventricle. Normal global wall motion. Normal diastolic filling pattern, normal LAP. Calculated EF 66%.  Mild mitral valve leaflet thickening. Normal mitral valve leaflet mobility. No evidence of mitral stenosis. Mild (Grade I) mitral regurgitation.  No other significant valvular abnormalities. No prior study for comparison.  Stress Testing: MPI 04/2019:Nuclear stress EF:  70%. The left ventricular ejection fraction is hyperdynamic (>65%). There was no ST segment deviation noted during stress. This is a low risk study. The study is normal.  Heart Catheterization: None  Coronary CTA performed at [facility]: 07/29/2020 records available via Care Everywhere 1. Three-vessel predominantly calcified atherosclerotic plaque with 25-49% stenosis in the distal left anterior descending and within a prominent  obtuse marginal branch. CAD-RADS 2.  2. Calcium Score: 189. The exact is percentile calculated to be 71%; this means 70% population has a lower calcium score and 29% population has a  higher calcium score.  3. CTFFR Analysis: CT FFR will not be performed for this study.   LABORATORY DATA: External Labs: Collected: 12/19/2020 [facility] available in Care Everywhere Total cholesterol 150, triglycerides 74, HDL 73, non-HDL 77, LDL direct  65. Hemoglobin A1c 7 Sodium 140, potassium 4.1, chloride 103, bicarb 28, BUN 13, creatinine 0.71 AST 16, ALT 16, alkaline phosphatase 78 eGFR >60   IMPRESSION:    ICD-10-CM   1. Coronary artery calcification of native artery  I25.10 EKG 12-Lead   I25.84     2. Dyslipidemia, goal LDL below 70  E78.5     3. Essential hypertension  I10     4. Obstructive sleep apnea syndrome  G47.33     5. Type 2 diabetes mellitus with hyperglycemia, without long-term current use of insulin (HCC)  E11.65     6. Former smoker  Z87.891     78. Class 3 severe obesity due to excess calories with serious comorbidity and body mass index (BMI) of 40.0 to 44.9 in adult [facility])  E66.01    Z68.41        RECOMMENDATIONS: Raniyah Curenton is a 71 y.o. female whose past medical history and cardiac risk factors include: Obesity due to excess calories, OSA not on CPAP, dyslipidemia, hypertension, non-insulin-dependent diabetes mellitus type 2, coronary artery calcification, postmenopausal female, advanced age, and former smoker.  Coronary artery calcification of native artery Total CAC 189 AU.  71 percentile. Continue aspirin and statin therapy. Denies angina pectoris. EKG: Normal sinus rhythm without underlying ischemia or injury pattern.   Educated on the importance of secondary prevention.  Dyslipidemia, goal LDL below 70 Reemphasized the importance of lipid management.   Given her diabetes would recommend goal LDL of at least less than 70 mg/dL. Patient states that she is scheduled for fasting lipid profile with her PCP coming up.  Essential hypertension/ obstructive sleep apnea syndrome Office blood pressures are very well controlled. Medications reconciled. I have asked her to reach out to her sleep medicine provider to be reevaluated/refitted for CPAP given her history of sleep apnea  Type 2 diabetes mellitus with hyperglycemia, without long-term current use of insulin ([place]) Educated on  importance of glycemic control. Currently on ARB, Jardiance, statin therapy.  Former smoker Reemphasized importance of complete smoking cessation  Class 3 severe obesity due to excess calories with serious comorbidity and body mass index (BMI) of 40.0 to 44.9 in adult [facility]) Body mass index is 43.67 kg/m. I reviewed with the patient the importance of diet, regular physical activity/exercise, weight loss.   Patient is educated on increasing physical activity gradually as tolerated.  With the goal of moderate intensity exercise for 30 minutes a day 5 days a week.  Total time spent: 35 minutes as part of today's office visit discussed management of at least 2 chronic comorbid conditions, reviewed prior results of the echo, coronary CTA,  reemphasized the importance of being reevaluated for sleep apnea, patient states that she will have labs performed with her PCP and will try to have a copy forwarded to [place] as well for reference.  Discussing disease management and coordination of care.  FINAL MEDICATION LIST END OF ENCOUNTER: No orders of the defined types were placed in this encounter.    Current Outpatient Medications:    ACCU-CHEK AVIVA PLUS test strip, USE TO TEST BLOOD SUGARS DAILY., Disp: 100 each, Rfl: 1   amlodipine-olmesartan (AZOR) 10-20 MG tablet, Take 1 tablet by mouth daily., Disp: , Rfl:    aspirin EC 81 MG tablet, Take 1 tablet (81 mg total) by mouth daily. Swallow whole., Disp: 30 tablet, Rfl: 11   Blood Glucose Monitoring Suppl (GLUCOCOM BLOOD GLUCOSE MONITOR) DEVI, One Touch Verio Meter. FSBS BID. E11.319, Disp: , Rfl:    Cholecalciferol (VITAMIN D3 PO), Take 5,000 Units by mouth daily., Disp: , Rfl:    dorzolamide-timolol (COSOPT) 22.3-6.8 MG/ML ophthalmic solution, Place 1 drop into both eyes 2 (two) times daily., Disp: , Rfl:    empagliflozin (JARDIANCE) 25 MG TABS tablet, Take 1 tablet by mouth daily., Disp: , Rfl:    hydrochlorothiazide (HYDRODIURIL) 25 MG tablet, TAKE 1  TABLET BY MOUTH EVERY DAY IN THE MORNING (Patient taking differently: Take 12.5 mg by mouth daily.), Disp: 90 tablet, Rfl: 0   Lancets (ONETOUCH DELICA PLUS [id number]) MISC, Apply topically., Disp: , Rfl:    LINZESS 72 MCG capsule, Take 72 mcg by mouth daily as needed (abdominal cramping). , Disp: , Rfl:    metFORMIN (GLUCOPHAGE) 500 MG tablet, Take 1,000 mg by mouth 2 (two) times daily with a meal. , Disp: , Rfl:    metoprolol succinate (TOPROL-XL) 25 MG 24 hr tablet, Take 12.5 mg by mouth daily., Disp: , Rfl:    omeprazole (PRILOSEC) 40 MG capsule, Take 40 mg by mouth every morning., Disp: , Rfl:    pregabalin (LYRICA) 75 MG capsule, Take by mouth., Disp: , Rfl:    rosuvastatin (CRESTOR) 10 MG tablet, Take 1 tablet (10 mg total) by mouth at bedtime for 90 doses., Disp: 90 tablet, Rfl: 0  Orders Placed This Encounter  Procedures   EKG 12-Lead    --Continue cardiac medications as reconciled in final medication list. --Return in about 6 months (around 11/09/2021) for Follow up, Coronary artery calcification. Or sooner if needed. --Continue follow-up with your primary care physician regarding the management of your other chronic comorbid conditions.  Patient's questions and concerns were addressed to her satisfaction. She voices understanding of the instructions provided during this encounter.   This note was created using a voice recognition software as a result there may be grammatical errors inadvertently enclosed that do not reflect the nature of this encounter. Every attempt is made to correct such errors.  Total time spent: 31 minutes.  Rex Kras, [place], [facility]  Pager: [phone] Office: [phone]

## 2021-05-29 NOTE — Progress Notes (Signed)
?[facility] - Clinic Note ? ?06/01/2021 ? ?  ? ?CHIEF COMPLAINT ?Patient presents for Retina Follow Up ? ?HISTORY OF PRESENT ILLNESS: ?Kylie Wilson is a 71 y.o. female who presents to the clinic today for:  ? ?HPI   ? ? Retina Follow Up   ?Patient presents with  Wet AMD.  In left eye.  Duration of 8 weeks.  I, the attending physician,  performed the HPI with the patient and updated documentation appropriately. ? ?  ?  ? ? 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  This has been going on for months.  Seems like the tearing is getting worse (7-8 months).  Cosopt BID OU. ?BS 161 this morning ?A1C 7.0 ? ?  ?  ?Last edited by Kylie Chris, MD on 06/01/2021 10:33 PM.  ?  ?Pt states left eye has been tearing a lot lately, pt has been under extra stress over the past month due to her daughter moving in with her ? ?Referring physician: ?Kylie Perna, MD ?[phone] ?[address] ?[place],  [place] [address] ? ?HISTORICAL INFORMATION:  ? 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 Rhina Brackett for DM ?PMH- ?  ? ?CURRENT MEDICATIONS: ?Current Outpatient Medications (Ophthalmic Drugs)  ?Medication Sig  ? dorzolamide-timolol (COSOPT) 22.3-6.8 MG/ML ophthalmic solution Place 1 drop into both eyes 2 (two) times daily.  ? ?No current facility-administered medications for this visit. (Ophthalmic Drugs)  ? ?Current Outpatient Medications (Other)  ?Medication Sig  ? amlodipine-olmesartan (AZOR) 10-20 MG tablet Take 1 tablet by mouth daily.  ? aspirin EC 81 MG tablet Take 1 tablet (81 mg total) by mouth daily. Swallow whole.  ? Cholecalciferol (VITAMIN D3 PO) Take 5,000 Units by mouth daily.  ? empagliflozin (JARDIANCE) 25 MG TABS tablet Take 1 tablet by mouth daily.  ? hydrochlorothiazide (HYDRODIURIL) 25 MG  tablet TAKE 1 TABLET BY MOUTH EVERY DAY IN THE MORNING (Patient taking differently: Take 12.5 mg by mouth daily.)  ? Lancets (ONETOUCH DELICA PLUS LANCET33G) MISC Apply topically.  ? LINZESS 72 MCG capsule Take 72 mcg by mouth daily as needed (abdominal cramping).   ? metFORMIN (GLUCOPHAGE) 500 MG tablet Take 1,000 mg by mouth 2 (two) times daily with a meal.   ? metoprolol succinate (TOPROL-XL) 25 MG 24 hr tablet Take 12.5 mg by mouth daily.  ? omeprazole (PRILOSEC) 40 MG capsule Take 40 mg by mouth every morning.  ? pregabalin (LYRICA) 75 MG capsule Take by mouth.  ? ACCU-CHEK AVIVA PLUS test strip USE TO TEST BLOOD SUGARS DAILY.  ? Blood Glucose Monitoring Suppl (GLUCOCOM BLOOD GLUCOSE MONITOR) DEVI One Touch Verio Meter. FSBS BID. E11.319  ? rosuvastatin (CRESTOR) 10 MG tablet Take 1 tablet (10 mg total) by mouth at bedtime for 90 doses.  ? ?No current facility-administered medications for this visit. (Other)  ? ?REVIEW OF SYSTEMS: ?ROS   ?Positive for: Gastrointestinal, Neurological, Musculoskeletal, Endocrine, Cardiovascular, Eyes, Respiratory ?Negative for: Constitutional, Skin, Genitourinary, HENT, Psychiatric, Allergic/Imm, Heme/Lymph ?Last edited by Kylie Wilson, COA on 06/01/2021  2:50 PM.  ?  ? ?ALLERGIES ?Allergies  ?Allergen Reactions  ? Duloxetine Other (See Comments)  ?  Pt says it also caused high pressure.  ? Lisinopril Other (See Comments)  ?  cough  ? ?PAST MEDICAL HISTORY ?Past Medical History:  ?Diagnosis Date  ? Arthritis   ? Barrett's esophagus   ? Burning pain   ? Cervical radiculopathy   ? Cervical spondylosis   ? Coronary artery calcification   ? DDD (degenerative disc disease), cervical   ? Diabetes mellitus   ? type 2   ? GERD (gastroesophageal reflux disease)   ? Hyperlipidemia   ? Hypertension   ? Hypertensive retinopathy   ? OU  ? Macular degeneration   ? Dry OD; Wet OS  ? Obesity   ? Rectocele   ? Sleep apnea   ? does not tolerate cpap  ? Uterine prolapse   ? ?Past Surgical History:   ?Procedure Laterality Date  ? BREAST BIOPSY  1980  ? left  ? BREAST EXCISIONAL BIOPSY Left   ? Bening 1980's  ? CATARACT EXTRACTION Bilateral   ? Dr. Randon GoldsmithLyles  ? EYE SURGERY Bilateral   ? Cat Sx - Dr. Randon GoldsmithLyles  ? SCAR REVISION  2001  ? both legs  ? TOE SURGERY  1981  ? 4th and 5th toe right foot  ? TOE SURGERY  2002  ? great toe left  ? TONSILLECTOMY    ? TUBAL LIGATION    ? ?FAMILY HISTORY ?Family History  ?Problem Relation Age of Onset  ? Heart disease Father   ? Pancreatic cancer Father   ? Breast cancer Paternal Aunt   ? Stroke Mother   ? Heart disease Brother   ? Colon cancer Neg Hx   ? Stomach cancer Neg Hx   ? Colon polyps Neg Hx   ? Rectal cancer Neg Hx   ? ?SOCIAL HISTORY ?Social History  ? ?Tobacco Use  ? Smoking status: Former  ?  Packs/day: 0.80  ?  Years: 30.00  ?  Pack years: 24.00  ?  Types: Cigarettes  ?  Quit date: 09/15/2011  ?  Years since quitting: 9.7  ? Smokeless tobacco: Never  ? Tobacco comments:  ?  smoked 20 years; then quit 10; smoked 30 years plus  ?Vaping Use  ? Vaping Use: Never used  ?Substance Use Topics  ? Alcohol use: Yes  ?  Comment: Every evening, 3 glasses of wine   ? Drug use: No  ?  ? ?  ?OPHTHALMIC EXAM: ?Base Eye Exam   ? ? Visual Acuity (Snellen - Linear)   ? ?   Right Left  ? Dist cc 20/25 20/25  ? Dist ph cc 20/20 NI  ? ? Correction: Glasses  ? ?  ?  ? ? Tonometry (Tonopen, 2:55 PM)   ? ?   Right Left  ? Pressure 18 18  ? ?  ?  ? ? Pupils   ? ?   Dark Light Shape React APD  ? Right 2 1 Round Minimal None  ? Left 2 1 Round Minimal None  ? ?  ?  ? ? Visual Fields (Counting fingers)   ? ?   Left Right  ?  Full Full  ? ?  ?  ? ? Extraocular Movement   ? ?   Right Left  ?  Full Full  ? ?  ?  ? ? Neuro/Psych   ? ? Oriented x3: Yes  ? Mood/Affect: Normal  ? ?  ?  ? ? Dilation   ? ? Both eyes: 1.0% Mydriacyl, 2.5% Phenylephrine @ 2:55 PM  ? ?  ?  ? ?  ? ?Slit  Lamp and Fundus Exam   ? ? Slit Lamp Exam   ? ?   Right Left  ? Lids/Lashes Dermatochalasis - upper lid, Meibomian gland  dysfunction Dermatochalasis - upper lid, Meibomian gland dysfunction  ? Conjunctiva/Sclera Melanosis, 1+ Injection Melanosis, 1+ Injection  ? Cornea trace Punctate epithelial erosions, arcus, high tear lake 1+Punctate epithelial erosions, arcus, high tear lake  ? Anterior Chamber Deep and quiet Deep and quiet  ? Iris Round and moderately dilated, No NVI Round and moderately dilated to 5.83mm, No NVI  ? Lens PC IOL in good position with open PC PC IOL in good position with open PC  ? Anterior Vitreous Vitreous syneresis, scattered silicone oil bubbles Vitreous syneresis, silicone oil bubbles  ? ?  ?  ? ? Fundus Exam   ? ?   Right Left  ? Disc Trace Pallor, +cupping, Thin inferior rim mild pallor, Sharp rim, Peripapillary atrophy, Thin inferior rim  ? C/D Ratio 0.7 0.6  ? Macula Flat, Blunted foveal reflex, Drusen, RPE mottling and clumping, No heme or edema good foveal reflex, RPE clumping and atrophy, interval increase in inferior SRF/edema, SN macula stably improved, +ERM, pigmented atrophy/scarring IN macula, no heme  ? Vessels attenuated, Tortuous attenuated, mild tortuousity  ? Periphery Attached    Attached, bullous schisis cavity from 0200-0430 -- stable; no RT/RD  ? ?  ?  ? ?  ? ?Refraction   ? ? Wearing Rx   ? ?   Sphere Cylinder Axis Add  ? Right -0.75 +2.00 163 +1.75  ? Left +0.00 +1.25 169 +1.75  ? ?  ?  ? ?  ? ?IMAGING AND PROCEDURES  ?Imaging and Procedures for 06/01/2021 ? ?OCT, Retina - OU - Both Eyes   ? ?   ?Right Eye ?Quality was good. Central Foveal Thickness: 220. Progression has been stable. Findings include no IRF, no SRF, retinal drusen , outer retinal atrophy, vitreomacular adhesion , normal foveal contour, intraretinal hyper-reflective material.  ? ?Left Eye ?Quality was good. Central Foveal Thickness: 231. Progression has worsened. Findings include normal foveal contour, no IRF, retinal drusen , subretinal hyper-reflective material, pigment epithelial detachment, choroidal neovascular  membrane, outer retinal atrophy, vitreomacular adhesion , no SRF (Interval increase in SRF inferior macula).  ? ?Notes ?*Images captured and stored on drive ? ?Diagnosis / Impression:  ?OD: nonexudative ARMD ?OS: exudat

## 2021-06-01 ENCOUNTER — Encounter (INDEPENDENT_AMBULATORY_CARE_PROVIDER_SITE_OTHER): Payer: Self-pay | Admitting: Ophthalmology

## 2021-06-01 ENCOUNTER — Ambulatory Visit (INDEPENDENT_AMBULATORY_CARE_PROVIDER_SITE_OTHER): Payer: Medicare Other | Admitting: Ophthalmology

## 2021-06-01 ENCOUNTER — Other Ambulatory Visit: Payer: Self-pay

## 2021-06-01 DIAGNOSIS — H353112 Nonexudative age-related macular degeneration, right eye, intermediate dry stage: Secondary | ICD-10-CM

## 2021-06-01 DIAGNOSIS — H353221 Exudative age-related macular degeneration, left eye, with active choroidal neovascularization: Secondary | ICD-10-CM

## 2021-06-01 DIAGNOSIS — H318 Other specified disorders of choroid: Secondary | ICD-10-CM | POA: Diagnosis not present

## 2021-06-01 DIAGNOSIS — Z961 Presence of intraocular lens: Secondary | ICD-10-CM

## 2021-06-01 DIAGNOSIS — H33102 Unspecified retinoschisis, left eye: Secondary | ICD-10-CM

## 2021-06-01 DIAGNOSIS — H35033 Hypertensive retinopathy, bilateral: Secondary | ICD-10-CM

## 2021-06-01 DIAGNOSIS — I10 Essential (primary) hypertension: Secondary | ICD-10-CM | POA: Diagnosis not present

## 2021-06-01 MED ORDER — BEVACIZUMAB CHEMO INJECTION 1.25MG/0.05ML SYRINGE FOR KALEIDOSCOPE
1.2500 mg | INTRAVITREAL | Status: AC | PRN
  Administered 2021-06-01: 1.25 mg via INTRAVITREAL

## 2021-06-23 ENCOUNTER — Other Ambulatory Visit: Payer: Self-pay | Admitting: Cardiology

## 2021-06-23 DIAGNOSIS — R072 Precordial pain: Secondary | ICD-10-CM

## 2021-06-23 DIAGNOSIS — I2510 Atherosclerotic heart disease of native coronary artery without angina pectoris: Secondary | ICD-10-CM

## 2021-08-03 ENCOUNTER — Ambulatory Visit: Payer: Medicare Other | Admitting: Podiatry

## 2021-09-21 ENCOUNTER — Ambulatory Visit: Payer: Medicare Other | Admitting: Cardiology

## 2021-09-22 NOTE — Progress Notes (Addendum)
[facility] Note  09/29/2021     CHIEF COMPLAINT Patient presents for Retina Follow Up  HISTORY OF PRESENT ILLNESS: Kylie Wilson is a 71 y.o. female who presents to the clinic today for:   HPI     Retina Follow Up   Patient presents with  Wet AMD.  Severity is mild.  Duration of 6 months.  Since onset it is stable.  I, the attending physician,  performed the HPI with the patient and updated documentation appropriately.        Comments   Patient should be a 6  week Retina Eval for Exu Armd os. Patient is 6 momths out. Patient states no change in her vision. She is complaining of tearing and drainage in the eye       Last edited by Bernarda Caffey, MD on 10/02/2021  1:24 AM.     Pt states she tried to get in to see Dr. Herbert Deaner for treatment on her schisis, she was unaware we could treat that here if needed, she states she cancelled her appt once she realized Dr. Herbert Deaner was a cataract surgeon, she feels like the schisis is making her vision worse, she states she also has "pus" in her eyes, she states it also "runs water a lot"  Referring physician: Rudene Anda, MD [address] [place],  [place] [address]  HISTORICAL INFORMATION:   Selected notes from the [facility] pt (2016) self-referred for possible RD LEE:  Ocular Hx- PCV diagnosed by Dr. Raylene Miyamoto at [facility]; followed w/ Dr. Irena Reichmann for DM PMH-    CURRENT MEDICATIONS: Current Outpatient Medications (Ophthalmic Drugs)  Medication Sig   dorzolamide-timolol (COSOPT) 22.3-6.8 MG/ML ophthalmic solution Place 1 drop into both eyes 2 (two) times daily.   No current facility-administered medications for this visit. (Ophthalmic Drugs)   Current Outpatient Medications (Other)  Medication Sig   ACCU-CHEK AVIVA PLUS test strip USE TO TEST BLOOD SUGARS DAILY.   amlodipine-olmesartan (AZOR) 10-20 MG tablet Take 1 tablet by mouth daily.   aspirin EC 81 MG  tablet Take 1 tablet (81 mg total) by mouth daily. Swallow whole.   Blood Glucose Monitoring Suppl (GLUCOCOM BLOOD GLUCOSE MONITOR) DEVI One Touch Verio Meter. FSBS BID. E11.319   Cholecalciferol (VITAMIN D3 PO) Take 5,000 Units by mouth daily.   empagliflozin (JARDIANCE) 25 MG TABS tablet Take 1 tablet by mouth daily.   hydrochlorothiazide (HYDRODIURIL) 25 MG tablet TAKE 1 TABLET BY MOUTH EVERY DAY IN THE MORNING (Patient taking differently: Take 12.5 mg by mouth daily.)   Lancets (ONETOUCH DELICA PLUS [id number]) MISC Apply topically.   LINZESS 72 MCG capsule Take 72 mcg by mouth daily as needed (abdominal cramping).    metFORMIN (GLUCOPHAGE) 500 MG tablet Take 1,000 mg by mouth 2 (two) times daily with a meal.    metoprolol succinate (TOPROL-XL) 25 MG 24 hr tablet TAKE 1 TAB EVERY MORNING. HOLD IF SYSTOLIC BLOOD PRESSURE (TOP NUMBER) <100 MMHG OR PULSE <60 BPM   omeprazole (PRILOSEC) 40 MG capsule Take 40 mg by mouth every morning.   pregabalin (LYRICA) 75 MG capsule Take by mouth.   rosuvastatin (CRESTOR) 10 MG tablet Take 1 tablet (10 mg total) by mouth at bedtime for 90 doses.   No current facility-administered medications for this visit. (Other)   REVIEW OF SYSTEMS: ROS   Positive for: Gastrointestinal, Neurological, Musculoskeletal, Endocrine, Cardiovascular, Eyes, Respiratory Negative for: Constitutional, Skin, Genitourinary, HENT, Psychiatric, Allergic/Imm,  Heme/Lymph Last edited by Elmore Guise, COT on 09/29/2021  1:17 PM.      ALLERGIES Allergies  Allergen Reactions   Duloxetine Other (See Comments)    Pt says it also caused high pressure.   Lisinopril Other (See Comments)    cough   PAST MEDICAL HISTORY Past Medical History:  Diagnosis Date   Arthritis    Barrett's esophagus    Burning pain    Cervical radiculopathy    Cervical spondylosis    Coronary artery calcification    DDD (degenerative disc disease), cervical    Diabetes mellitus    type 2    GERD  (gastroesophageal reflux disease)    Hyperlipidemia    Hypertension    Hypertensive retinopathy    OU   Macular degeneration    Dry OD; Wet OS   Obesity    Rectocele    Sleep apnea    does not tolerate cpap   Uterine prolapse    Past Surgical History:  Procedure Laterality Date   BREAST BIOPSY  1980   left   BREAST EXCISIONAL BIOPSY Left    Bening 1980's   CATARACT EXTRACTION Bilateral    Dr. Prudencio Burly   EYE SURGERY Bilateral    Cat Sx - Dr. Prudencio Burly   SCAR REVISION  2001   both legs   TOE SURGERY  1981   4th and 5th toe right foot   TOE SURGERY  2002   great toe left   TONSILLECTOMY     TUBAL LIGATION     FAMILY HISTORY Family History  Problem Relation Age of Onset   Heart disease Father    Pancreatic cancer Father    Breast cancer Paternal Aunt    Stroke Mother    Heart disease Brother    Colon cancer Neg Hx    Stomach cancer Neg Hx    Colon polyps Neg Hx    Rectal cancer Neg Hx    SOCIAL HISTORY Social History   Tobacco Use   Smoking status: Former    Packs/day: 0.80    Years: 30.00    Total pack years: 24.00    Types: Cigarettes    Quit date: 09/15/2011    Years since quitting: 10.0   Smokeless tobacco: Never   Tobacco comments:    smoked 20 years; then quit 10; smoked 30 years plus  Vaping Use   Vaping Use: Never used  Substance Use Topics   Alcohol use: Yes    Comment: Every evening, 3 glasses of wine    Drug use: No       OPHTHALMIC EXAM: Base Eye Exam     Visual Acuity (Snellen - Linear)       Right Left   Dist cc 20/20 20/20    Correction: Glasses         Tonometry (Tonopen, 1:23 PM)       Right Left   Pressure 12 12         Pupils       Dark Light Shape React APD   Right 3 2 Round Brisk None   Left 3 2 Round Brisk None         Visual Fields (Counting fingers)       Left Right    Full Full         Extraocular Movement       Right Left    Full, Ortho Full, Ortho         Neuro/Psych  Oriented x3: Yes    Mood/Affect: Normal         Dilation     Both eyes: 1.0% Mydriacyl, 2.5% Phenylephrine @ 1:23 PM           Slit Lamp and Fundus Exam     Slit Lamp Exam       Right Left   Lids/Lashes Dermatochalasis - upper lid, Meibomian gland dysfunction Dermatochalasis - upper lid, Meibomian gland dysfunction   Conjunctiva/Sclera Melanosis, 1+ Injection Melanosis, 1+ Injection   Cornea trace Punctate epithelial erosions, arcus, high tear lake Trace Punctate epithelial erosions, arcus, high tear lake   Anterior Chamber Deep and quiet Deep and quiet   Iris Round and moderately dilated, No NVI Round and moderately dilated to 5.63m, No NVI   Lens PC IOL in good position with open PC PC IOL in good position with open PC   Anterior Vitreous Vitreous syneresis, scattered silicone oil bubbles Vitreous syneresis, silicone oil bubbles         Fundus Exam       Right Left   Disc Mild Pallor, +cupping, Thin inferior rim mild pallor, Sharp rim, Peripapillary atrophy, Thin inferior rim   C/D Ratio 0.7 0.6   Macula Flat, Blunted foveal reflex, Drusen, RPE mottling and clumping, mild ERM, No heme or edema good foveal reflex, RPE clumping and atrophy, interval improvement in inferior SRF/edema, SN macula stably improved, +ERM, pigmented atrophy/scarring IN macula, focal IRH SN mac just inside ST arcades   Vessels attenuated, mild attenuation, mild tortuosity attenuated, mild tortuosity   Periphery Attached    Attached, bullous schisis cavity from 0200-0430 -- stable; no RT/RD           Refraction     Wearing Rx       Sphere Cylinder Axis Add   Right -0.75 +2.00 163 +1.75   Left +0.00 +1.25 169 +1.75           IMAGING AND PROCEDURES  Imaging and Procedures for 09/29/2021  OCT, Retina - OU - Both Eyes       Right Eye Quality was good. Central Foveal Thickness: 221. Progression has been stable. Findings include normal foveal contour, no IRF, no SRF, retinal drusen , intraretinal  hyper-reflective material, outer retinal atrophy, vitreomacular adhesion .   Left Eye Quality was good. Central Foveal Thickness: 221. Progression has been stable. Findings include normal foveal contour, no IRF, no SRF, retinal drusen , subretinal hyper-reflective material, choroidal neovascular membrane, pigment epithelial detachment, outer retinal atrophy, vitreomacular adhesion (Stable improvement in SRF ).   Notes *Images captured and stored on drive  Diagnosis / Impression:  OD: nonexudative ARMD OS: exudative ARMD / PCV -- stable improvement in SRF  Clinical management:  See below  Abbreviations: NFP - Normal foveal profile. CME - cystoid macular edema. PED - pigment epithelial detachment. IRF - intraretinal fluid. SRF - subretinal fluid. EZ - ellipsoid zone. ERM - epiretinal membrane. ORA - outer retinal atrophy. ORT - outer retinal tubulation. SRHM - subretinal hyper-reflective material. IRHM - intraretinal hyper-reflective material            ASSESSMENT/PLAN:   ICD-10-CM   1. 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 Intermediate stage nonexudative age-related macular degeneration of right eye  H35.3112     4. Left retinoschisis  H33.102  5. Essential hypertension  I10     6. Pseudophakia, both eyes  Z96.1     7. Hypertensive retinopathy of both eyes  H35.033       1-3. Idiopathic polypoidal choroidal vasculopathy (PCV) OU  - delayed from 6 weeks to 4 months  - Exudative age related macular degeneration, OS  - Nonexudative ARMD OD  - was seen in 2016 here by JDM  - transferred care to [facility] w/ Dr. Raylene Miyamoto -- received intravitreal injxns  - then transferred to Dr. Annia Belt -- last visit 07/2019 -- IVE OD #7 for DME  - has seen Dr. Manuella Ghazi twice  - FA (11.29.21) shows peripapillary CNV OS +  peripheral leakage OU consistent w/ PCV  - ICG (02.28.22) shows polypoidal choroidal vasculopathy (PCV); Focal hyper cyanscence superior to disc -- cluster of polyps   - here, s/p IVA OS #1 (11.29.21), #2 (12.31.21), #3 (01.31.22), #4 (02.28.22), #5 (03.30.22), #6 (04.27.22), #7 (05.27.22), # 8 (06.24.22), #9 (07.25.22), #10 (08.29.22--JDM), #11 (09.26.22), #12 (10.24.22), #13 (11.28.22), #14 (01.23.23), #15 (03.20.23)  - OCT shows OD without exudative disease, OS with stable improvement in SRF  - BCVA 20/20 OU  - recommend holding injections today  - pt in agreement - informed consent obtained, signed and scanned, 11.29.21 (IVA OS)  - f/u in 6 wks -- DFE/OCT, possible injection--scan through schisis OS (0200-0430)  4. Retinoschisis OS  - focal, bullous schisis cavity in temporal periphery 2-430  - widefield OCT confirms schisis w/o RD  - stable  - continue monitoring for now  - may need laser if retinoschisis progresses posteriorly  5,6. Hypertensive retinopathy OU - discussed importance of tight BP control - monitor  7. Pseudophakia OU  - s/p CE/IOL (Dr. Prudencio Burly)  - IOL in good position, doing well - monitor   Ophthalmic Meds Ordered this visit:  No orders of the defined types were placed in this encounter.    Return in about 6 weeks (around 11/10/2021) for f/u 6 weeks, exu ARMD OS, DFE, OCT.  There are no Patient Instructions on file for this visit.  This document serves as a record of services personally performed by Gardiner Sleeper, MD, PhD. It was created on their behalf by Renaldo Reel, [place] an ophthalmic technician. The creation of this record is the provider's dictation and/or activities during the visit.    Electronically signed by:  Renaldo Reel, COT  09/22/21 1:32 AM  This document serves as a record of services personally performed by Gardiner Sleeper, MD, PhD. It was created on their behalf by San Jetty. Owens Shark, OA an ophthalmic technician. The creation of  this record is the provider's dictation and/or activities during the visit.    Electronically signed by: San Jetty. Owens Shark, [place] 07.18.2023 1:32 AM  Gardiner Sleeper, M.D., Ph.D. Diseases & Surgery of the Retina and Vitreous [facility]  I have reviewed the above documentation for accuracy and completeness, and I agree with the above. Gardiner Sleeper, M.D., Ph.D. 10/02/21 1:32 AM  Abbreviations: M myopia (nearsighted); A astigmatism; H hyperopia (farsighted); P presbyopia; Mrx spectacle prescription;  CTL contact lenses; OD right eye; OS left eye; OU both eyes  XT exotropia; ET esotropia; PEK punctate epithelial keratitis; PEE punctate epithelial erosions; DES dry eye syndrome; MGD meibomian gland dysfunction; ATs artificial tears; PFAT's preservative free artificial tears; [place] nuclear sclerotic cataract; PSC posterior subcapsular cataract; ERM epi-retinal membrane; PVD posterior vitreous detachment; RD retinal detachment; DM diabetes mellitus; DR diabetic  retinopathy; NPDR non-proliferative diabetic retinopathy; PDR proliferative diabetic retinopathy; CSME clinically significant macular edema; DME diabetic macular edema; dbh dot blot hemorrhages; CWS cotton wool spot; POAG primary open angle glaucoma; C/D cup-to-disc ratio; HVF humphrey visual field; GVF goldmann visual field; OCT optical coherence tomography; IOP intraocular pressure; BRVO Branch retinal vein occlusion; CRVO central retinal vein occlusion; CRAO central retinal artery occlusion; BRAO branch retinal artery occlusion; RT retinal tear; SB scleral buckle; PPV pars plana vitrectomy; VH Vitreous hemorrhage; PRP panretinal laser photocoagulation; IVK intravitreal kenalog; VMT vitreomacular traction; MH Macular hole;  NVD neovascularization of the disc; NVE neovascularization elsewhere; AREDS age related eye disease study; ARMD age related macular degeneration; POAG primary open angle glaucoma; EBMD epithelial/anterior basement  membrane dystrophy; ACIOL anterior chamber intraocular lens; IOL intraocular lens; PCIOL posterior chamber intraocular lens; Phaco/IOL phacoemulsification with intraocular lens placement; [place] photorefractive keratectomy; LASIK laser assisted in situ keratomileusis; HTN hypertension; DM diabetes mellitus; COPD chronic obstructive pulmonary disease

## 2021-09-29 ENCOUNTER — Encounter (INDEPENDENT_AMBULATORY_CARE_PROVIDER_SITE_OTHER): Payer: Self-pay | Admitting: Ophthalmology

## 2021-09-29 ENCOUNTER — Ambulatory Visit (INDEPENDENT_AMBULATORY_CARE_PROVIDER_SITE_OTHER): Payer: Medicare Other | Admitting: Ophthalmology

## 2021-09-29 DIAGNOSIS — H353112 Nonexudative age-related macular degeneration, right eye, intermediate dry stage: Secondary | ICD-10-CM

## 2021-09-29 DIAGNOSIS — H353221 Exudative age-related macular degeneration, left eye, with active choroidal neovascularization: Secondary | ICD-10-CM | POA: Diagnosis not present

## 2021-09-29 DIAGNOSIS — H35033 Hypertensive retinopathy, bilateral: Secondary | ICD-10-CM | POA: Diagnosis not present

## 2021-09-29 DIAGNOSIS — H318 Other specified disorders of choroid: Secondary | ICD-10-CM

## 2021-09-29 DIAGNOSIS — I10 Essential (primary) hypertension: Secondary | ICD-10-CM

## 2021-09-29 DIAGNOSIS — Z961 Presence of intraocular lens: Secondary | ICD-10-CM

## 2021-09-29 DIAGNOSIS — H33102 Unspecified retinoschisis, left eye: Secondary | ICD-10-CM | POA: Diagnosis not present

## 2021-10-02 ENCOUNTER — Encounter (INDEPENDENT_AMBULATORY_CARE_PROVIDER_SITE_OTHER): Payer: Self-pay | Admitting: Ophthalmology

## 2021-11-07 ENCOUNTER — Other Ambulatory Visit: Payer: Self-pay | Admitting: Cardiology

## 2021-11-07 DIAGNOSIS — I10 Essential (primary) hypertension: Secondary | ICD-10-CM

## 2021-11-09 ENCOUNTER — Ambulatory Visit: Payer: Medicare Other | Admitting: Cardiology

## 2021-11-09 ENCOUNTER — Encounter: Payer: Self-pay | Admitting: Cardiology

## 2021-11-09 VITALS — BP 132/69 | HR 71 | Temp 97.0°F | Resp 16 | Ht 64.0 in | Wt 261.0 lb

## 2021-11-09 DIAGNOSIS — E1165 Type 2 diabetes mellitus with hyperglycemia: Secondary | ICD-10-CM

## 2021-11-09 DIAGNOSIS — I10 Essential (primary) hypertension: Secondary | ICD-10-CM

## 2021-11-09 DIAGNOSIS — Z87891 Personal history of nicotine dependence: Secondary | ICD-10-CM

## 2021-11-09 DIAGNOSIS — E785 Hyperlipidemia, unspecified: Secondary | ICD-10-CM

## 2021-11-09 DIAGNOSIS — G4733 Obstructive sleep apnea (adult) (pediatric): Secondary | ICD-10-CM

## 2021-11-09 DIAGNOSIS — I2510 Atherosclerotic heart disease of native coronary artery without angina pectoris: Secondary | ICD-10-CM

## 2021-11-09 NOTE — Progress Notes (Signed)
Date:  11/09/2021   ID:  [name], DOB 06-Dec-1950, MRN [id number]  PCP:  Rudene Anda, MD  Cardiologist:  Rex Kras, DO, [facility] (established care 07/13/2019) Former Cardiology Providers: Dr. Skeet Latch and and Kerin Ransom PA-C  Date: 11/09/21 Last Office Visit: 05/12/2021  Chief Complaint  Patient presents with   Coronary artery calcification   Follow-up    HPI  Kylie Wilson is a 71 y.o. female whose past medical history and cardiovascular risk factors include: Obesity due to excess calories, OSA not on CPAP, dyslipidemia, hypertension, non-insulin-dependent diabetes mellitus type 2, moderate coronary artery calcification, mild nonobstructive CAD per coronary CTA, postmenopausal female, advanced age, and former smoker.    Prior to establishing care she had a CT scan which noted coronary artery calcification and given her risk factors she did undergo a nuclear stress test which was reported to be low risk.  However, in the interim she was evaluated at [facility] and underwent coronary CTA which noted moderate CAC and mild nonobstructive CAD.  She continue to focus on improving her modifiable cardiovascular risk factors but given her reoccurrence of chest pain she was referred to GI for further evaluation and management.  Endoscopy were performed and patient was noted to have Barrett's esophagus, ulcers, and esophagitis.  Since then patient has essentially discontinued BC powder but still takes it on as needed basis.  She now presents for 63-monthfollow-up visit.  Over the last 6 months patient states that she feels much better.  Her shortness of breath has improved significantly with the use of a dehumidifier, she is actively working on losing weight, and no hospitalizations or urgent care visits for cardiovascular symptoms.  ALLERGIES: Allergies  Allergen Reactions   Duloxetine Other (See Comments)    Pt says it also caused high pressure.   Lisinopril Other (See  Comments)    cough    MEDICATION LIST PRIOR TO VISIT: Current Meds  Medication Sig   ACCU-CHEK AVIVA PLUS test strip USE TO TEST BLOOD SUGARS DAILY.   amlodipine-olmesartan (AZOR) 10-20 MG tablet Take 1 tablet by mouth daily.   aspirin EC 81 MG tablet Take 1 tablet (81 mg total) by mouth daily. Swallow whole.   Blood Glucose Monitoring Suppl (GLUCOCOM BLOOD GLUCOSE MONITOR) DEVI One Touch Verio Meter. FSBS BID. E11.319   Cholecalciferol (VITAMIN D3 PO) Take 5,000 Units by mouth daily.   dorzolamide-timolol (COSOPT) 22.3-6.8 MG/ML ophthalmic solution Place 1 drop into both eyes 2 (two) times daily.   empagliflozin (JARDIANCE) 25 MG TABS tablet Take 1 tablet by mouth daily.   hydrochlorothiazide (HYDRODIURIL) 25 MG tablet TAKE 1 TABLET BY MOUTH EVERY DAY IN THE MORNING   Lancets (ONETOUCH DELICA PLUS [id number] MISC Apply topically.   LINZESS 72 MCG capsule Take 72 mcg by mouth daily as needed (abdominal cramping).    metFORMIN (GLUCOPHAGE) 500 MG tablet Take 1,000 mg by mouth 2 (two) times daily with a meal.    metoprolol succinate (TOPROL-XL) 25 MG 24 hr tablet TAKE 1 TAB EVERY MORNING. HOLD IF SYSTOLIC BLOOD PRESSURE (TOP NUMBER) <100 MMHG OR PULSE <60 BPM   omeprazole (PRILOSEC) 40 MG capsule Take 40 mg by mouth every morning.   pregabalin (LYRICA) 75 MG capsule Take by mouth.   rosuvastatin (CRESTOR) 10 MG tablet Take 1 tablet (10 mg total) by mouth at bedtime for 90 doses.     PAST MEDICAL HISTORY: Past Medical History:  Diagnosis Date   Arthritis    Barrett's esophagus  Burning pain    Cervical radiculopathy    Cervical spondylosis    Coronary artery calcification    DDD (degenerative disc disease), cervical    Diabetes mellitus    type 2    GERD (gastroesophageal reflux disease)    Hyperlipidemia    Hypertension    Hypertensive retinopathy    OU   Macular degeneration    Dry OD; Wet OS   Obesity    Rectocele    Sleep apnea    does not tolerate cpap   Uterine  prolapse     PAST SURGICAL HISTORY: Past Surgical History:  Procedure Laterality Date   BREAST BIOPSY  1980   left   BREAST EXCISIONAL BIOPSY Left    Bening 1980's   CATARACT EXTRACTION Bilateral    Dr. Prudencio Burly   EYE SURGERY Bilateral    Cat Sx - Dr. Prudencio Burly   SCAR REVISION  2001   both legs   TOE SURGERY  1981   4th and 5th toe right foot   TOE SURGERY  2002   great toe left   TONSILLECTOMY     TUBAL LIGATION      FAMILY HISTORY: The patient family history includes Breast cancer in her paternal aunt; Heart disease in her brother and father; Pancreatic cancer in her father; Stroke in her mother.  SOCIAL HISTORY:  The patient  reports that she quit smoking about 10 years ago. Her smoking use included cigarettes. She has a 24.00 pack-year smoking history. She has never used smokeless tobacco. She reports current alcohol use. She reports that she does not use drugs.  REVIEW OF SYSTEMS: Review of Systems  Cardiovascular:  Negative for chest pain, claudication, dyspnea on exertion, irregular heartbeat, leg swelling, near-syncope, orthopnea, palpitations, paroxysmal nocturnal dyspnea and syncope.  Respiratory:  Negative for shortness of breath.   Hematologic/Lymphatic: Negative for bleeding problem.  Musculoskeletal:  Negative for muscle cramps and myalgias.  Neurological:  Negative for dizziness and light-headedness.    PHYSICAL EXAM:    11/09/2021    2:05 PM 05/12/2021    2:07 PM 02/09/2021   11:27 AM  Vitals with BMI  Height '5\' 4"'  '5\' 4"'  '5\' 4"'   Weight 261 lbs 254 lbs 6 oz 255 lbs  BMI 44.78 16.38 45.36  Systolic 468 032 122  Diastolic 69 74 70  Pulse 71 76 69   Physical Exam  Constitutional: No distress.  Age appropriate, hemodynamically stable.   Neck: No JVD present.  Cardiovascular: Normal rate, regular rhythm, S1 normal, S2 normal, intact distal pulses and normal pulses. Exam reveals no gallop, no S3 and no S4.  No murmur heard. Pulses:      Dorsalis pedis pulses  are 2+ on the right side and 2+ on the left side.       Posterior tibial pulses are 2+ on the right side and 2+ on the left side.  Pulmonary/Chest: Effort normal and breath sounds normal. No stridor. She has no wheezes. She has no rales.  Abdominal: Soft. Bowel sounds are normal. She exhibits no distension. There is no abdominal tenderness.  Musculoskeletal:        General: No edema.     Cervical back: Neck supple.  Neurological: She is alert and oriented to person, place, and time. She has intact cranial nerves (2-12).  Skin: Skin is warm and moist.   CARDIAC DATABASE: EKG: 11/09/2021: Sinus rhythm, 67 bpm, normal axis, without underlying ischemia injury pattern  Echocardiogram: 07/23/2019:  Normal LV systolic  function with visual EF 65-70%. Left ventricle cavity is normal in size. Mild concentric hypertrophy of the left ventricle. Normal global wall motion. Normal diastolic filling pattern, normal LAP.  Mild mitral valve leaflet thickening. Normal mitral valve leaflet mobility. No evidence of mitral stenosis. Mild (Grade I) mitral regurgitation.  No other significant valvular abnormalities. No prior study for comparison.  Stress Testing: MPI 04/2019:Nuclear stress EF: 70%. The left ventricular ejection fraction is hyperdynamic (>65%). There was no ST segment deviation noted during stress. This is a low risk study. The study is normal.  Heart Catheterization: None  Coronary CTA performed at [facility]: 07/29/2020 records available via Care Everywhere 1. Three-vessel predominantly calcified atherosclerotic plaque with 25-49% stenosis in the distal left anterior descending and within a prominent  obtuse marginal branch. CAD-RADS 2.  2. Calcium Score: 189. The exact is percentile calculated to be 71%; this means 70% population has a lower calcium score and 29% population has a  higher calcium score.  3. CTFFR Analysis: CT FFR will not be performed for this study.   LABORATORY  DATA: External Labs: Collected: 12/19/2020 [facility] available in Care Everywhere Total cholesterol 150, triglycerides 74, HDL 73, non-HDL 77, LDL direct 65. Hemoglobin A1c 7 Sodium 140, potassium 4.1, chloride 103, bicarb 28, BUN 13, creatinine 0.71 AST 16, ALT 16, alkaline phosphatase 78 eGFR >60  External Labs: Collected: 11/03/2021 available in Care Everywhere. Sodium 142, potassium 3.9, chloride 105, bicarb 25, BUN 15, creatinine 0.76. eGFR 84. A1c 7.1.  Collected: 07/03/2021 available in Care Everywhere. Total cholesterol 151, triglycerides 80, HDL 70, LDL 73, non-HDL 81  IMPRESSION:    ICD-10-CM   1. Coronary artery calcification of native artery  I25.10 EKG 12-Lead   I25.84     2. Dyslipidemia, goal LDL below 70  E78.5     3. Essential hypertension  I10     4. Obstructive sleep apnea syndrome  G47.33     5. Type 2 diabetes mellitus with hyperglycemia, without long-term current use of insulin (HCC)  E11.65     6. Former smoker  Z87.891        RECOMMENDATIONS: Kylie Wilson is a 71 y.o. female whose past medical history and cardiac risk factors include: Obesity due to excess calories, OSA not on CPAP, dyslipidemia, hypertension, non-insulin-dependent diabetes mellitus type 2, coronary artery calcification, postmenopausal female, advanced age, and former smoker.  Coronary artery calcification of native artery Total CAC 189 AU, 71st percentile.  Continue aspirin and statin therapy. Denies angina pectoris. EKG: Nonischemic Has done well on medical therapy and reemphasized the importance of secondary prevention.  Patient is scheduled to see sleep medicine in September 2023.  Patient is motivated to lose weight and is encouraged to increase physical activity as tolerated with a goal of 30 minutes a day 5 days a week.  Dyslipidemia, goal LDL below 70 Currently on rosuvastatin.   She denies myalgia or other side  effects. Most recent lipids dated April 2023, independently reviewed as noted above. Given her diabetes, coronary artery calcification, and nonobstructive CAD would recommend a goal LDL <70 mg/dL.  Currently managed by primary care provider.  Essential hypertension Office blood pressures are well controlled. Medications reconciled. No changes warranted at this time  Type 2 diabetes mellitus with hyperglycemia, without long-term current use of insulin ([place]) Reemphasized importance of glycemic control.  Continue ARB, Jardiance, statin therapy  Her yearly well visit is in April and therefore would like to see  her back in May 2024 and thereafter if she remains stable will recommend annual follow-ups every May.  She is more than welcome to come back if change in clinical status.  FINAL MEDICATION LIST END OF ENCOUNTER: No orders of the defined types were placed in this encounter.    Current Outpatient Medications:    ACCU-CHEK AVIVA PLUS test strip, USE TO TEST BLOOD SUGARS DAILY., Disp: 100 each, Rfl: 1   amlodipine-olmesartan (AZOR) 10-20 MG tablet, Take 1 tablet by mouth daily., Disp: , Rfl:    aspirin EC 81 MG tablet, Take 1 tablet (81 mg total) by mouth daily. Swallow whole., Disp: 30 tablet, Rfl: 11   Blood Glucose Monitoring Suppl (GLUCOCOM BLOOD GLUCOSE MONITOR) DEVI, One Touch Verio Meter. FSBS BID. E11.319, Disp: , Rfl:    Cholecalciferol (VITAMIN D3 PO), Take 5,000 Units by mouth daily., Disp: , Rfl:    dorzolamide-timolol (COSOPT) 22.3-6.8 MG/ML ophthalmic solution, Place 1 drop into both eyes 2 (two) times daily., Disp: , Rfl:    empagliflozin (JARDIANCE) 25 MG TABS tablet, Take 1 tablet by mouth daily., Disp: , Rfl:    hydrochlorothiazide (HYDRODIURIL) 25 MG tablet, TAKE 1 TABLET BY MOUTH EVERY DAY IN THE MORNING, Disp: 90 tablet, Rfl: 0   Lancets (ONETOUCH DELICA PLUS [id number]) MISC, Apply topically., Disp: , Rfl:    LINZESS 72 MCG capsule, Take 72 mcg by mouth daily as  needed (abdominal cramping). , Disp: , Rfl:    metFORMIN (GLUCOPHAGE) 500 MG tablet, Take 1,000 mg by mouth 2 (two) times daily with a meal. , Disp: , Rfl:    metoprolol succinate (TOPROL-XL) 25 MG 24 hr tablet, TAKE 1 TAB EVERY MORNING. HOLD IF SYSTOLIC BLOOD PRESSURE (TOP NUMBER) <100 MMHG OR PULSE <60 BPM, Disp: 90 tablet, Rfl: 3   omeprazole (PRILOSEC) 40 MG capsule, Take 40 mg by mouth every morning., Disp: , Rfl:    pregabalin (LYRICA) 75 MG capsule, Take by mouth., Disp: , Rfl:    rosuvastatin (CRESTOR) 10 MG tablet, Take 1 tablet (10 mg total) by mouth at bedtime for 90 doses., Disp: 90 tablet, Rfl: 0  Orders Placed This Encounter  Procedures   EKG 12-Lead    --Continue cardiac medications as reconciled in final medication list. --Return in about 8 months (around 07/14/2022) for Follow up, CAD, Coronary artery calcification, Annual follow up visit. Or sooner if needed. --Continue follow-up with your primary care physician regarding the management of your other chronic comorbid conditions.  Patient's questions and concerns were addressed to her satisfaction. She voices understanding of the instructions provided during this encounter.   This note was created using a voice recognition software as a result there may be grammatical errors inadvertently enclosed that do not reflect the nature of this encounter. Every attempt is made to correct such errors.  Rex Kras, [place], [facility]  Pager: [phone] Office: [phone]

## 2021-11-10 ENCOUNTER — Encounter (INDEPENDENT_AMBULATORY_CARE_PROVIDER_SITE_OTHER): Payer: Medicare Other | Admitting: Ophthalmology

## 2022-03-30 ENCOUNTER — Other Ambulatory Visit: Payer: Self-pay | Admitting: Cardiology

## 2022-03-30 DIAGNOSIS — I10 Essential (primary) hypertension: Secondary | ICD-10-CM

## 2022-04-08 NOTE — Progress Notes (Signed)
[facility] Note  04/09/2022     CHIEF COMPLAINT Patient presents for Retina Follow Up  HISTORY OF PRESENT ILLNESS: Kylie Wilson is a 72 y.o. female who presents to the clinic today for:   HPI     Retina Follow Up   Patient presents with  Wet AMD.  In both eyes.  This started 6 months ago.  Duration of 6 months.  Since onset it is stable.  I, the attending physician,  performed the HPI with the patient and updated documentation appropriately.        Comments   6 month retina eval ARMD OU pt is reporting that her vision is no as sharp she has noticed a flash of light in the left eye on occasion she denies any floaters pt has been seeing Dr Clide Dales for her glaucoma she is now on rocklatan ou qhs dorz bid ou latanoprost ou qhs       Last edited by Bernarda Caffey, MD on 04/09/2022 11:52 AM.    Pt is back on the referral of Dr. Clide Dales at [facility], she is seeing them for glaucoma  Referring physician: Oliver Barre, MD [facility] 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 Irena Reichmann for DM PMH-    CURRENT MEDICATIONS: Current Outpatient Medications (Ophthalmic Drugs)  Medication Sig   dorzolamide-timolol (COSOPT) 22.3-6.8 MG/ML ophthalmic solution Place 1 drop into both eyes 2 (two) times daily.   No current facility-administered medications for this visit. (Ophthalmic Drugs)   Current Outpatient Medications (Other)  Medication Sig   ACCU-CHEK AVIVA PLUS test strip USE TO TEST BLOOD SUGARS DAILY.   amlodipine-olmesartan (AZOR) 10-20 MG tablet Take 1 tablet by mouth daily.   aspirin EC 81 MG tablet Take 1 tablet (81 mg total) by mouth daily. Swallow whole.   Blood Glucose Monitoring Suppl  (GLUCOCOM BLOOD GLUCOSE MONITOR) DEVI One Touch Verio Meter. FSBS BID. E11.319   Cholecalciferol (VITAMIN D3 PO) Take 5,000 Units by mouth daily.   empagliflozin (JARDIANCE) 25 MG TABS tablet Take 1 tablet by mouth daily.   hydrochlorothiazide (HYDRODIURIL) 25 MG tablet TAKE 1 TABLET BY MOUTH EVERY DAY IN THE MORNING   Lancets (ONETOUCH DELICA PLUS OINOMV67M) MISC Apply topically.   LINZESS 72 MCG capsule Take 72 mcg by mouth daily as needed (abdominal cramping).    metFORMIN (GLUCOPHAGE) 500 MG tablet Take 1,000 mg by mouth 2 (two) times daily with a meal.    metoprolol succinate (TOPROL-XL) 25 MG 24 hr tablet TAKE 1 TAB EVERY MORNING. HOLD IF SYSTOLIC BLOOD PRESSURE (TOP NUMBER) <100 MMHG OR PULSE <60 BPM   omeprazole (PRILOSEC) 40 MG capsule Take 40 mg by mouth every morning.   pregabalin (LYRICA) 75 MG capsule Take by mouth.   rosuvastatin (CRESTOR) 10 MG tablet Take 1 tablet (10 mg total) by mouth at bedtime for 90 doses.   No current facility-administered medications for this visit. (Other)   REVIEW OF SYSTEMS: ROS   Positive for: Gastrointestinal, Neurological, Musculoskeletal, Endocrine, Cardiovascular, Eyes, Respiratory Negative for: Constitutional, Skin, Genitourinary, HENT, Psychiatric, Allergic/Imm, Heme/Lymph Last edited by Parthenia Ames, COT on 04/09/2022  8:42 AM.     ALLERGIES Allergies  Allergen Reactions   Duloxetine Other (See Comments)  Pt says it also caused high pressure.   Lisinopril Other (See Comments)    cough   PAST MEDICAL HISTORY Past Medical History:  Diagnosis Date   Arthritis    Barrett's esophagus    Burning pain    Cervical radiculopathy    Cervical spondylosis    Coronary artery calcification    DDD (degenerative disc disease), cervical    Diabetes mellitus    type 2    GERD (gastroesophageal reflux disease)    Hyperlipidemia    Hypertension    Hypertensive retinopathy    OU   Macular degeneration    Dry OD; Wet OS   Obesity     Rectocele    Sleep apnea    does not tolerate cpap   Uterine prolapse    Past Surgical History:  Procedure Laterality Date   BREAST BIOPSY  1980   left   BREAST EXCISIONAL BIOPSY Left    Bening 1980's   CATARACT EXTRACTION Bilateral    Dr. Prudencio Burly   EYE SURGERY Bilateral    Cat Sx - Dr. Prudencio Burly   SCAR REVISION  2001   both legs   TOE SURGERY  1981   4th and 5th toe right foot   TOE SURGERY  2002   great toe left   TONSILLECTOMY     TUBAL LIGATION     FAMILY HISTORY Family History  Problem Relation Age of Onset   Heart disease Father    Pancreatic cancer Father    Breast cancer Paternal Aunt    Stroke Mother    Heart disease Brother    Colon cancer Neg Hx    Stomach cancer Neg Hx    Colon polyps Neg Hx    Rectal cancer Neg Hx    SOCIAL HISTORY Social History   Tobacco Use   Smoking status: Former    Packs/day: 0.80    Years: 30.00    Total pack years: 24.00    Types: Cigarettes    Quit date: 09/15/2011    Years since quitting: 10.5   Smokeless tobacco: Never   Tobacco comments:    smoked 20 years; then quit 10; smoked 30 years plus  Vaping Use   Vaping Use: Never used  Substance Use Topics   Alcohol use: Yes    Comment: Every evening, 3 glasses of wine    Drug use: No       OPHTHALMIC EXAM: Base Eye Exam     Visual Acuity (Snellen - Linear)       Right Left   Dist cc 20/25 20/30 -2   Dist ph cc NI NI    Correction: Glasses         Tonometry (Tonopen, 8:41 AM)       Right Left   Pressure 13 11         Pupils       Pupils Dark Light Shape React APD   Right PERRL 3 2 Round Brisk None   Left PERRL 3 2 Round Brisk None         Visual Fields       Left Right    Full Full         Extraocular Movement       Right Left    Full, Ortho Full, Ortho         Neuro/Psych     Oriented x3: Yes   Mood/Affect: Normal         Dilation  Both eyes: 2.5% Phenylephrine @ 8:41 AM           Slit Lamp and Fundus Exam      Slit Lamp Exam       Right Left   Lids/Lashes Dermatochalasis - upper lid, Meibomian gland dysfunction Dermatochalasis - upper lid, Meibomian gland dysfunction   Conjunctiva/Sclera Melanosis, 1-2+ Injection Melanosis, 1-2+ Injection   Cornea trace Punctate epithelial erosions, arcus, high tear lake Trace Punctate epithelial erosions, arcus, high tear lake   Anterior Chamber Deep and quiet Deep and quiet   Iris Round and moderately dilated, No NVI Round and moderately dilated to 5.28m, No NVI   Lens PC IOL in good position with open PC PC IOL in good position with open PC   Anterior Vitreous Vitreous syneresis, scattered silicone oil bubbles Vitreous syneresis, silicone oil bubbles         Fundus Exam       Right Left   Disc Mild Pallor, +cupping, Thin inferior rim mild pallor, Sharp rim, Peripapillary atrophy, Thin inferior rim   C/D Ratio 0.7 0.6   Macula Flat, Blunted foveal reflex, Drusen, RPE mottling and clumping, mild ERM, No heme or edema blunted foveal reflex, RPE clumping and atrophy, interval re-development of perpapillary SRF/edema extending to fovea, no frank hememproved, +ERM, pigmented atrophy/scarring IN macula, focal IRH SN mac just inside ST arcades   Vessels attenuated, mild attenuation, mild tortuosity attenuated, mild tortuosity   Periphery Attached    Attached, bullous schisis cavity from 0200-0430 -- stable; no RT/RD           Refraction     Wearing Rx       Sphere Cylinder Axis Add   Right -0.75 +2.00 163 +1.75   Left +0.00 +1.25 169 +1.75           IMAGING AND PROCEDURES  Imaging and Procedures for 04/09/2022  OCT, Retina - OU - Both Eyes       Right Eye Quality was good. Central Foveal Thickness: 228. Progression has been stable. Findings include normal foveal contour, no IRF, no SRF, retinal drusen , intraretinal hyper-reflective material, pigment epithelial detachment, vitreomacular adhesion .   Left Eye Quality was good. Central  Foveal Thickness: 390. Progression has worsened. Findings include no IRF, abnormal foveal contour, retinal drusen , subretinal hyper-reflective material, choroidal neovascular membrane, pigment epithelial detachment, subretinal fluid, outer retinal atrophy, vitreomacular adhesion (Interval re-development of SRF overlying peripapillary CNV and extending to fovea).   Notes *Images captured and stored on drive  Diagnosis / Impression:  OD: nonexudative ARMD OS: exudative ARMD / PCV -- Interval re-development of SRF overlying peripapillary CNV and extending to fovea  Clinical management:  See below  Abbreviations: NFP - Normal foveal profile. CME - cystoid macular edema. PED - pigment epithelial detachment. IRF - intraretinal fluid. SRF - subretinal fluid. EZ - ellipsoid zone. ERM - epiretinal membrane. ORA - outer retinal atrophy. ORT - outer retinal tubulation. SRHM - subretinal hyper-reflective material. IRHM - intraretinal hyper-reflective material      Intravitreal Injection, Pharmacologic Agent - OS - Left Eye       Time Out 04/09/2022. 9:34 AM. Confirmed correct patient, procedure, site, and patient consented.   Anesthesia Topical anesthesia was used. Anesthetic medications included Lidocaine 2%, Proparacaine 0.5%.   Procedure Preparation included 5% betadine to ocular surface, eyelid speculum. A (32g) needle was used.   Injection: 1.25 mg Bevacizumab 1.267m0.05ml   Route: Intravitreal, Site: Left Eye   NDC: [id number]: : [id number]  Expiration date: 06/05/2022   Post-op Post injection exam found visual acuity of at least counting fingers. The patient tolerated the procedure well. There were no complications. The patient received written and verbal post procedure care education.            ASSESSMENT/PLAN:   ICD-10-CM   1. 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 Essential hypertension  I10     6. Hypertensive retinopathy of both eyes  H35.033     7. Pseudophakia, both eyes  Z96.1      1-3. Idiopathic polypoidal choroidal vasculopathy (PCV) OU  - lost to follow up for 6 mos from 07.18.23 to 01.26.24  - Exudative age related macular degeneration, OS (PCV)  - Nonexudative ARMD OD  - was seen in 2016 here by JDM  - transferred care to [facility] w/ Dr. Raylene Miyamoto -- received intravitreal injxns  - then transferred to Dr. Annia Belt -- last visit 07/2019 -- IVE OD #7 for DME  - has seen Dr. Manuella Ghazi twice  - FA (11.29.21) shows peripapillary CNV OS + peripheral leakage OU consistent w/ PCV  - ICG (02.28.22) shows polypoidal choroidal vasculopathy (PCV); Focal hyper cyanscence superior to disc -- cluster of polyps   - here, s/p IVA OS #1 (11.29.21), #2 (12.31.21), #3 (01.31.22), #4 (02.28.22), #5 (03.30.22), #6 (04.27.22), #7 (05.27.22), # 8 (06.24.22), #9 (07.25.22), #10 (08.29.22--JDM), #11 (09.26.22), #12 (10.24.22), #13 (11.28.22), #14 (01.23.23), #15 (03.20.23)  - OCT shows OS  interval re-development of SRF overlying peripapillary CNV and extending to fovea at 10 mos since last injection  - BCVA OD: 20/25 - stable, OS: 20/30 -- decreased  - recommend IVA OS #16 today, 01.26.24  - pt wishes to proceed with injection  - RBA of procedure discussed, questions answered - see procedure note - informed consent obtained, signed and scanned, 01.26.24 (IVA OS)  - f/u in 6 wks -- DFE/OCT, possible injection--scan through schisis OS (0200-0430)  4. Retinoschisis OS  - focal, bullous schisis cavity in temporal periphery 2-430  - widefield OCT confirms schisis w/o RD  -  stable  - continue monitoring for now  - may need laser if retinoschisis progresses posteriorly  5,6. Hypertensive retinopathy OU - discussed importance of tight BP control - monitor  7. Pseudophakia OU  - s/p CE/IOL OU (Dr. Prudencio Burly)  - IOLs in good position, doing well - monitor   Ophthalmic Meds Ordered this visit:  Meds ordered this encounter  Medications   Bevacizumab (AVASTIN) SOLN 1.25 mg     Return in about 4 weeks (around 05/07/2022) for exu ARMD OS, DFE, OCT, Possible Injxn.  There are no Patient Instructions on file for this visit.  This document serves as a record of services personally performed by Gardiner Sleeper, MD, PhD. It was created on their behalf by San Jetty. Owens Shark, OA an ophthalmic technician. The creation of this record is the provider's dictation and/or activities during the visit.    Electronically signed by: San Jetty. [place], [place] 01.25.2024 11:52 AM  Gardiner Sleeper, M.D., Ph.D. Diseases & Surgery  of the Retina and Vitreous [facility]  I have reviewed the above documentation for accuracy and completeness, and I agree with the above. Gardiner Sleeper, M.D., Ph.D. 04/09/22 11:56 AM   Abbreviations: M myopia (nearsighted); A astigmatism; H hyperopia (farsighted); P presbyopia; Mrx spectacle prescription;  CTL contact lenses; OD right eye; OS left eye; OU both eyes  XT exotropia; ET esotropia; PEK punctate epithelial keratitis; PEE punctate epithelial erosions; DES dry eye syndrome; MGD meibomian gland dysfunction; ATs artificial tears; PFAT's preservative free artificial tears; Carnegie nuclear sclerotic cataract; PSC posterior subcapsular cataract; ERM epi-retinal membrane; PVD posterior vitreous detachment; RD retinal detachment; DM diabetes mellitus; DR diabetic retinopathy; NPDR non-proliferative diabetic retinopathy; PDR proliferative diabetic retinopathy; CSME clinically significant macular edema; DME diabetic macular edema; dbh dot blot  hemorrhages; CWS cotton wool spot; POAG primary open angle glaucoma; C/D cup-to-disc ratio; HVF humphrey visual field; GVF goldmann visual field; OCT optical coherence tomography; IOP intraocular pressure; BRVO Branch retinal vein occlusion; CRVO central retinal vein occlusion; CRAO central retinal artery occlusion; BRAO branch retinal artery occlusion; RT retinal tear; SB scleral buckle; PPV pars plana vitrectomy; VH Vitreous hemorrhage; PRP panretinal laser photocoagulation; IVK intravitreal kenalog; VMT vitreomacular traction; MH Macular hole;  NVD neovascularization of the disc; NVE neovascularization elsewhere; AREDS age related eye disease study; ARMD age related macular degeneration; POAG primary open angle glaucoma; EBMD epithelial/anterior basement membrane dystrophy; ACIOL anterior chamber intraocular lens; IOL intraocular lens; PCIOL posterior chamber intraocular lens; Phaco/IOL phacoemulsification with intraocular lens placement; [place] photorefractive keratectomy; LASIK laser assisted in situ keratomileusis; HTN hypertension; DM diabetes mellitus; COPD chronic obstructive pulmonary disease

## 2022-04-09 ENCOUNTER — Encounter (INDEPENDENT_AMBULATORY_CARE_PROVIDER_SITE_OTHER): Payer: Self-pay | Admitting: Ophthalmology

## 2022-04-09 ENCOUNTER — Ambulatory Visit (INDEPENDENT_AMBULATORY_CARE_PROVIDER_SITE_OTHER): Payer: 59 | Admitting: Ophthalmology

## 2022-04-09 DIAGNOSIS — H318 Other specified disorders of choroid: Secondary | ICD-10-CM | POA: Diagnosis not present

## 2022-04-09 DIAGNOSIS — H353221 Exudative age-related macular degeneration, left eye, with active choroidal neovascularization: Secondary | ICD-10-CM | POA: Diagnosis not present

## 2022-04-09 DIAGNOSIS — Z961 Presence of intraocular lens: Secondary | ICD-10-CM

## 2022-04-09 DIAGNOSIS — H353112 Nonexudative age-related macular degeneration, right eye, intermediate dry stage: Secondary | ICD-10-CM

## 2022-04-09 DIAGNOSIS — I10 Essential (primary) hypertension: Secondary | ICD-10-CM | POA: Diagnosis not present

## 2022-04-09 DIAGNOSIS — H35033 Hypertensive retinopathy, bilateral: Secondary | ICD-10-CM | POA: Diagnosis not present

## 2022-04-09 DIAGNOSIS — H33102 Unspecified retinoschisis, left eye: Secondary | ICD-10-CM | POA: Diagnosis not present

## 2022-04-09 MED ORDER — BEVACIZUMAB CHEMO INJECTION 1.25MG/0.05ML SYRINGE FOR KALEIDOSCOPE
1.2500 mg | INTRAVITREAL | Status: AC | PRN
  Administered 2022-04-09: 1.25 mg via INTRAVITREAL

## 2022-04-13 ENCOUNTER — Ambulatory Visit (INDEPENDENT_AMBULATORY_CARE_PROVIDER_SITE_OTHER): Payer: 59 | Admitting: Ophthalmology

## 2022-04-13 ENCOUNTER — Encounter (INDEPENDENT_AMBULATORY_CARE_PROVIDER_SITE_OTHER): Payer: Self-pay | Admitting: Ophthalmology

## 2022-04-13 DIAGNOSIS — H353221 Exudative age-related macular degeneration, left eye, with active choroidal neovascularization: Secondary | ICD-10-CM | POA: Diagnosis not present

## 2022-04-13 DIAGNOSIS — H353112 Nonexudative age-related macular degeneration, right eye, intermediate dry stage: Secondary | ICD-10-CM

## 2022-04-13 DIAGNOSIS — H318 Other specified disorders of choroid: Secondary | ICD-10-CM | POA: Diagnosis not present

## 2022-04-13 DIAGNOSIS — I10 Essential (primary) hypertension: Secondary | ICD-10-CM

## 2022-04-13 DIAGNOSIS — H33102 Unspecified retinoschisis, left eye: Secondary | ICD-10-CM | POA: Diagnosis not present

## 2022-04-13 DIAGNOSIS — H35033 Hypertensive retinopathy, bilateral: Secondary | ICD-10-CM

## 2022-04-13 DIAGNOSIS — Z961 Presence of intraocular lens: Secondary | ICD-10-CM

## 2022-04-13 NOTE — Progress Notes (Signed)
[facility] Note  04/13/2022     CHIEF COMPLAINT Patient presents for Retina Follow Up  HISTORY OF PRESENT ILLNESS: Kylie Wilson is a 72 y.o. female who presents to the clinic today for:   HPI     Retina Follow Up   Patient presents with  Wet AMD.  In left eye.  Severity is moderate.  Duration of 4 days.  Since onset it is rapidly worsening.  I, the attending physician,  performed the HPI with the patient and updated documentation appropriately.        Comments   Pt here for 4 day f/u s/p IVA OS on 04/09/22 with worsening VA OS. Pt states the day after her injection she noticed VA in OS was darkening. Reporting a spot left of central VA OS that is a dark spot. Pt can see through it slightly. No floaters or FOL reported. Some aching/pain in OS, itching as well. Sugar levels have been reported normal, had a reading of 136 this AM.       Last edited by Bernarda Caffey, MD on 04/14/2022  9:33 PM.    Pt feels like her vision is "darker" since she had an injection on 01.26.24  Referring physician: Oliver Barre, MD [facility] 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 Irena Reichmann for DM PMH-    CURRENT MEDICATIONS: Current Outpatient Medications (Ophthalmic Drugs)  Medication Sig   dorzolamide-timolol (COSOPT) 22.3-6.8 MG/ML ophthalmic solution Place 1 drop into both eyes 2 (two) times daily.   No current facility-administered medications for this visit. (Ophthalmic Drugs)   Current Outpatient Medications (Other)  Medication Sig   ACCU-CHEK AVIVA PLUS test strip USE TO TEST BLOOD SUGARS DAILY.   amlodipine-olmesartan (AZOR) 10-20 MG tablet Take 1 tablet by mouth daily.   aspirin EC 81 MG tablet Take 1 tablet (81 mg  total) by mouth daily. Swallow whole.   Blood Glucose Monitoring Suppl (GLUCOCOM BLOOD GLUCOSE MONITOR) DEVI One Touch Verio Meter. FSBS BID. E11.319   Cholecalciferol (VITAMIN D3 PO) Take 5,000 Units by mouth daily.   empagliflozin (JARDIANCE) 25 MG TABS tablet Take 1 tablet by mouth daily.   hydrochlorothiazide (HYDRODIURIL) 25 MG tablet TAKE 1 TABLET BY MOUTH EVERY DAY IN THE MORNING   Lancets (ONETOUCH DELICA PLUS [id number]) MISC Apply topically.   LINZESS 72 MCG capsule Take 72 mcg by mouth daily as needed (abdominal cramping).    metFORMIN (GLUCOPHAGE) 500 MG tablet Take 1,000 mg by mouth 2 (two) times daily with a meal.    metoprolol succinate (TOPROL-XL) 25 MG 24 hr tablet TAKE 1 TAB EVERY MORNING. HOLD IF SYSTOLIC BLOOD PRESSURE (TOP NUMBER) <100 MMHG OR PULSE <60 BPM   omeprazole (PRILOSEC) 40 MG capsule Take 40 mg by mouth every morning.   pregabalin (LYRICA) 75 MG capsule Take by mouth.   rosuvastatin (CRESTOR) 10 MG tablet Take 1 tablet (10 mg total) by mouth at bedtime for 90 doses.   No current facility-administered medications for this visit. (Other)   REVIEW OF SYSTEMS: ROS   Positive for: Gastrointestinal, Neurological, Musculoskeletal, Endocrine, Cardiovascular, Eyes, Respiratory Negative for: Constitutional, Skin, Genitourinary, HENT, Psychiatric, Allergic/Imm, Heme/Lymph Last edited by Kingsley Spittle, COT on 04/13/2022  1:02 PM.     ALLERGIES  Allergies  Allergen Reactions   Duloxetine Other (See Comments)    Pt says it also caused high pressure.   Lisinopril Other (See Comments)    cough   PAST MEDICAL HISTORY Past Medical History:  Diagnosis Date   Arthritis    Barrett's esophagus    Burning pain    Cervical radiculopathy    Cervical spondylosis    Coronary artery calcification    DDD (degenerative disc disease), cervical    Diabetes mellitus    type 2    GERD (gastroesophageal reflux disease)    Hyperlipidemia    Hypertension    Hypertensive  retinopathy    OU   Macular degeneration    Dry OD; Wet OS   Obesity    Rectocele    Sleep apnea    does not tolerate cpap   Uterine prolapse    Past Surgical History:  Procedure Laterality Date   BREAST BIOPSY  1980   left   BREAST EXCISIONAL BIOPSY Left    Bening 1980's   CATARACT EXTRACTION Bilateral    Dr. Prudencio Burly   EYE SURGERY Bilateral    Cat Sx - Dr. Prudencio Burly   SCAR REVISION  2001   both legs   TOE SURGERY  1981   4th and 5th toe right foot   TOE SURGERY  2002   great toe left   TONSILLECTOMY     TUBAL LIGATION     FAMILY HISTORY Family History  Problem Relation Age of Onset   Heart disease Father    Pancreatic cancer Father    Breast cancer Paternal Aunt    Stroke Mother    Heart disease Brother    Colon cancer Neg Hx    Stomach cancer Neg Hx    Colon polyps Neg Hx    Rectal cancer Neg Hx    SOCIAL HISTORY Social History   Tobacco Use   Smoking status: Former    Packs/day: 0.80    Years: 30.00    Total pack years: 24.00    Types: Cigarettes    Quit date: 09/15/2011    Years since quitting: 10.5   Smokeless tobacco: Never   Tobacco comments:    smoked 20 years; then quit 10; smoked 30 years plus  Vaping Use   Vaping Use: Never used  Substance Use Topics   Alcohol use: Yes    Comment: Every evening, 3 glasses of wine    Drug use: No       OPHTHALMIC EXAM: Base Eye Exam     Visual Acuity (Snellen - Linear)       Right Left   Dist cc 20/25 20/30 -2   Dist ph cc  NI         Tonometry (Tonopen, 1:07 PM)       Right Left   Pressure 17 12         Pupils       Pupils Dark Light Shape React APD   Right PERRL 3 2 Round Brisk None   Left PERRL 3 2 Round Brisk None         Visual Fields (Counting fingers)       Left Right    Full Full         Extraocular Movement       Right Left    Full, Ortho Full, Ortho         Neuro/Psych     Oriented x3: Yes   Mood/Affect: Normal  Dilation     Both eyes: 1.0%  Mydriacyl, 2.5% Phenylephrine @ 1:08 PM           Slit Lamp and Fundus Exam     Slit Lamp Exam       Right Left   Lids/Lashes Dermatochalasis - upper lid, Meibomian gland dysfunction Dermatochalasis - upper lid, Meibomian gland dysfunction   Conjunctiva/Sclera Melanosis, 1-2+ Injection Melanosis, 1-2+ Injection   Cornea trace Punctate epithelial erosions, arcus, high tear lake Trace Punctate epithelial erosions, arcus, high tear lake   Anterior Chamber Deep and quiet Deep and quiet   Iris Round and moderately dilated, No NVI Round and moderately dilated to 5.3mm, No NVI   Lens PC IOL in good position with open PC PC IOL in good position with open PC   Anterior Vitreous Vitreous syneresis, scattered silicone oil bubbles Vitreous syneresis, silicone oil bubbles         Fundus Exam       Right Left   Disc Mild Pallor, +cupping, Thin inferior rim mild pallor, Sharp rim, Peripapillary atrophy, Thin inferior rim   C/D Ratio 0.7 0.6   Macula Flat, Blunted foveal reflex, Drusen, RPE mottling and clumping, mild ERM, No heme or edema blunted foveal reflex, RPE clumping and atrophy, persistent, peripapillary SRF/edema extending to fovea, punctate MA / IRH +ERM, pigmented atrophy/scarring IN macula   Vessels attenuated, mild attenuation, mild tortuosity attenuated, mild tortuosity   Periphery Attached    Attached, bullous schisis cavity from 0200-0430 -- stable; no RT/RD           Refraction     Wearing Rx       Sphere Cylinder Axis Add   Right -0.75 +2.00 163 +1.75   Left +0.00 +1.25 169 +1.75           IMAGING AND PROCEDURES  Imaging and Procedures for 04/13/2022  OCT, Retina - OU - Both Eyes       Right Eye Quality was good. Central Foveal Thickness: 231. Progression has been stable. Findings include normal foveal contour, no IRF, no SRF, retinal drusen , subretinal hyper-reflective material, intraretinal hyper-reflective material, pigment epithelial detachment,  vitreomacular adhesion .   Left Eye Quality was good. Central Foveal Thickness: 461. Progression has been stable. Findings include no IRF, abnormal foveal contour, retinal drusen , subretinal hyper-reflective material, choroidal neovascular membrane, pigment epithelial detachment, subretinal fluid, outer retinal atrophy, vitreomacular adhesion (Persistent SRF overlying peripapillary CNV and extending to fovea -- slightly increased centrally, slightly improved superior to disc).   Notes *Images captured and stored on drive  Diagnosis / Impression:  OD: nonexudative ARMD OS: exudative ARMD / PCV -- Persistent SRF overlying peripapillary CNV and extending to fovea -- slightly increased centrally, slightly improved superior to disc  Clinical management:  See below  Abbreviations: NFP - Normal foveal profile. CME - cystoid macular edema. PED - pigment epithelial detachment. IRF - intraretinal fluid. SRF - subretinal fluid. EZ - ellipsoid zone. ERM - epiretinal membrane. ORA - outer retinal atrophy. ORT - outer retinal tubulation. SRHM - subretinal hyper-reflective material. IRHM - intraretinal hyper-reflective material            ASSESSMENT/PLAN:   ICD-10-CM   1. 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 Intermediate stage nonexudative age-related macular degeneration of right eye  H35.3112  4. 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 Essential hypertension  I10     6. Hypertensive retinopathy of both eyes  H35.033     7. Pseudophakia, both eyes  Z96.1      **pt presents acutely for subjective darkening / decrease in vision OS, post injection  - BCVA stable at 20/30  - exam and OCT stable  - no new findings to explain subjective complaint  - reassured pt of stable exam  - f/u as scheduled  1-3. Idiopathic polypoidal  choroidal vasculopathy (PCV) OU  - lost to follow up for 6 mos from 07.18.23 to 01.26.24  - Exudative age related macular degeneration, OS (PCV)  - Nonexudative ARMD OD  - was seen in 2016 here by JDM  - transferred care to [facility] w/ Dr. Dorette Grate -- received intravitreal injxns  - then transferred to Dr. Quinn Axe -- last visit 07/2019 -- IVE OD #7 for DME  - has seen Dr. Sherryll Burger twice  - FA (11.29.21) shows peripapillary CNV OS + peripheral leakage OU consistent w/ PCV  - ICG (02.28.22) shows polypoidal choroidal vasculopathy (PCV); Focal hyper cyanscence superior to disc -- cluster of polyps   - here, s/p IVA OS #1 (11.29.21), #2 (12.31.21), #3 (01.31.22), #4 (02.28.22), #5 (03.30.22), #6 (04.27.22), #7 (05.27.22), # 8 (06.24.22), #9 (07.25.22), #10 (08.29.22--JDM), #11 (09.26.22), #12 (10.24.22), #13 (11.28.22), #14 (01.23.23), #15 (03.20.23), #1 (01.26.23)  - OCT shows OS  Persistent SRF overlying peripapillary CNV and extending to fovea -- slightly increased centrally, slightly improved superior to disc  - BCVA OD: 20/25 - stable, OS: 20/30 -- stable - informed consent obtained, signed and scanned, 01.26.24 (IVA OS)  - f/u as scheduled -- DFE/OCT, possible injection--scan through schisis OS (0200-0430)  4. Retinoschisis OS  - focal, bullous schisis cavity in temporal periphery 2-430  - widefield OCT confirms schisis w/o RD  - stable  - continue monitoring for now  - may need laser if retinoschisis progresses posteriorly  5,6. Hypertensive retinopathy OU - discussed importance of tight BP control - monitor  7. Pseudophakia OU  - s/p CE/IOL OU (Dr. Randon Goldsmith)  - IOLs in good position, doing well - monitor   Ophthalmic Meds Ordered this visit:  No orders of the defined types were placed in this encounter.    Return for f/u as scheduled.  There are no Patient Instructions on file for this visit.  This document serves as a record of services personally performed by Karie Chimera, MD,  PhD. It was created on their behalf by Glee Arvin. Manson Passey, OA an ophthalmic technician. The creation of this record is the provider's dictation and/or activities during the visit.    Electronically signed by: Glee Arvin. Manson Passey, [place] 01.30.2024 9:33 PM  Karie Chimera, M.D., Ph.D. Diseases & Surgery of the Retina and Vitreous [facility]  I have reviewed the above documentation for accuracy and completeness, and I agree with the above. Karie Chimera, M.D., Ph.D. 04/14/22 9:37 PM   Abbreviations: M myopia (nearsighted); A astigmatism; H hyperopia (farsighted); P presbyopia; Mrx spectacle prescription;  CTL contact lenses; OD right eye; OS left eye; OU both eyes  XT exotropia; ET esotropia; PEK punctate epithelial keratitis; PEE punctate epithelial erosions; DES dry eye syndrome; MGD meibomian gland dysfunction; ATs artificial tears; PFAT's preservative free artificial tears; NSC nuclear sclerotic cataract; PSC posterior subcapsular cataract; ERM epi-retinal membrane; PVD posterior vitreous detachment; RD retinal detachment; DM diabetes mellitus; DR diabetic retinopathy;  NPDR non-proliferative diabetic retinopathy; PDR proliferative diabetic retinopathy; CSME clinically significant macular edema; DME diabetic macular edema; dbh dot blot hemorrhages; CWS cotton wool spot; POAG primary open angle glaucoma; C/D cup-to-disc ratio; HVF humphrey visual field; GVF goldmann visual field; OCT optical coherence tomography; IOP intraocular pressure; BRVO Branch retinal vein occlusion; CRVO central retinal vein occlusion; CRAO central retinal artery occlusion; BRAO branch retinal artery occlusion; RT retinal tear; SB scleral buckle; PPV pars plana vitrectomy; VH Vitreous hemorrhage; PRP panretinal laser photocoagulation; IVK intravitreal kenalog; VMT vitreomacular traction; MH Macular hole;  NVD neovascularization of the disc; NVE neovascularization elsewhere; AREDS age related eye disease study;  ARMD age related macular degeneration; POAG primary open angle glaucoma; EBMD epithelial/anterior basement membrane dystrophy; ACIOL anterior chamber intraocular lens; IOL intraocular lens; PCIOL posterior chamber intraocular lens; Phaco/IOL phacoemulsification with intraocular lens placement; [place] photorefractive keratectomy; LASIK laser assisted in situ keratomileusis; HTN hypertension; DM diabetes mellitus; COPD chronic obstructive pulmonary disease

## 2022-04-14 ENCOUNTER — Encounter (INDEPENDENT_AMBULATORY_CARE_PROVIDER_SITE_OTHER): Payer: Self-pay | Admitting: Ophthalmology

## 2022-05-06 NOTE — Progress Notes (Signed)
[facility] Note  05/07/2022     CHIEF COMPLAINT Patient presents for Retina Follow Up  HISTORY OF PRESENT ILLNESS: Kylie Wilson is a 72 y.o. female who presents to the clinic today for:   HPI     Retina Follow Up   Patient presents with  Wet AMD.  In both eyes.  This started 4 weeks ago.  Duration of 4 weeks.  Since onset it is stable.  I, the attending physician,  performed the HPI with the patient and updated documentation appropriately.        Comments   4 week retina follow up ARMD and possible injection pt states no vision changes noticed she denies any flashes or floaters       Last edited by Bernarda Caffey, MD on 05/08/2022 10:28 AM.      Referring physician: Oliver Barre, MD [facility] [address] [place], [place]  [address]  HISTORICAL INFORMATION:   Selected notes from the MEDICAL RECORD NUMBER Previous Zigmund Daniel pt (2016) self-referred for possible RD LEE:  Ocular Hx- PCV diagnosed by Dr. Raylene Miyamoto at [facility]; followed w/ Dr. Irena Reichmann for DM PMH-    CURRENT MEDICATIONS: Current Outpatient Medications (Ophthalmic Drugs)  Medication Sig   dorzolamide-timolol (COSOPT) 2-0.5 % ophthalmic solution Place 1 drop into both eyes 2 (two) times daily.   Bromfenac Sodium (PROLENSA) 0.07 % SOLN Place 1 drop into both eyes 4 (four) times daily.   dorzolamide-timolol (COSOPT) 22.3-6.8 MG/ML ophthalmic solution Place 1 drop into both eyes 2 (two) times daily.   No current facility-administered medications for this visit. (Ophthalmic Drugs)   Current Outpatient Medications (Other)  Medication Sig   ACCU-CHEK AVIVA PLUS test strip USE TO TEST BLOOD SUGARS DAILY.   amlodipine-olmesartan (AZOR) 10-20 MG tablet Take 1 tablet by mouth daily.   aspirin EC 81 MG tablet Take 1 tablet (81 mg total) by mouth daily. Swallow whole.   Blood Glucose Monitoring Suppl (GLUCOCOM BLOOD GLUCOSE MONITOR) DEVI One Touch Verio  Meter. FSBS BID. E11.319   Cholecalciferol (VITAMIN D3 PO) Take 5,000 Units by mouth daily.   empagliflozin (JARDIANCE) 25 MG TABS tablet Take 1 tablet by mouth daily.   hydrochlorothiazide (HYDRODIURIL) 25 MG tablet TAKE 1 TABLET BY MOUTH EVERY DAY IN THE MORNING   Lancets (ONETOUCH DELICA PLUS [id number]) MISC Apply topically.   LINZESS 72 MCG capsule Take 72 mcg by mouth daily as needed (abdominal cramping).    metFORMIN (GLUCOPHAGE) 500 MG tablet Take 1,000 mg by mouth 2 (two) times daily with a meal.    metoprolol succinate (TOPROL-XL) 25 MG 24 hr tablet TAKE 1 TAB EVERY MORNING. HOLD IF SYSTOLIC BLOOD PRESSURE (TOP NUMBER) <100 MMHG OR PULSE <60 BPM   omeprazole (PRILOSEC) 40 MG capsule Take 40 mg by mouth every morning.   pregabalin (LYRICA) 75 MG capsule Take by mouth.   rosuvastatin (CRESTOR) 10 MG tablet Take 1 tablet (10 mg total) by mouth at bedtime for 90 doses.   No current facility-administered medications for this visit. (Other)   REVIEW OF SYSTEMS: ROS   Positive for: Gastrointestinal, Neurological, Musculoskeletal, Endocrine, Cardiovascular, Eyes, Respiratory Negative for: Constitutional, Skin, Genitourinary, HENT, Psychiatric, Allergic/Imm, Heme/Lymph Last edited by Parthenia Ames, COT on 05/07/2022  8:52 AM.     ALLERGIES Allergies  Allergen Reactions   Duloxetine Other (See Comments)    Pt says it also caused high pressure.   Lisinopril Other (See Comments)  cough   PAST MEDICAL HISTORY Past Medical History:  Diagnosis Date   Arthritis    Barrett's esophagus    Burning pain    Cervical radiculopathy    Cervical spondylosis    Coronary artery calcification    DDD (degenerative disc disease), cervical    Diabetes mellitus    type 2    GERD (gastroesophageal reflux disease)    Hyperlipidemia    Hypertension    Hypertensive retinopathy    OU   Macular degeneration    Dry OD; Wet OS   Obesity    Rectocele    Sleep apnea    does not tolerate  cpap   Uterine prolapse    Past Surgical History:  Procedure Laterality Date   BREAST BIOPSY  1980   left   BREAST EXCISIONAL BIOPSY Left    Bening 1980's   CATARACT EXTRACTION Bilateral    Dr. Prudencio Burly   EYE SURGERY Bilateral    Cat Sx - Dr. Prudencio Burly   SCAR REVISION  2001   both legs   TOE SURGERY  1981   4th and 5th toe right foot   TOE SURGERY  2002   great toe left   TONSILLECTOMY     TUBAL LIGATION     FAMILY HISTORY Family History  Problem Relation Age of Onset   Heart disease Father    Pancreatic cancer Father    Breast cancer Paternal Aunt    Stroke Mother    Heart disease Brother    Colon cancer Neg Hx    Stomach cancer Neg Hx    Colon polyps Neg Hx    Rectal cancer Neg Hx    SOCIAL HISTORY Social History   Tobacco Use   Smoking status: Former    Packs/day: 0.80    Years: 30.00    Total pack years: 24.00    Types: Cigarettes    Quit date: 09/15/2011    Years since quitting: 10.6   Smokeless tobacco: Never   Tobacco comments:    smoked 20 years; then quit 10; smoked 30 years plus  Vaping Use   Vaping Use: Never used  Substance Use Topics   Alcohol use: Yes    Comment: Every evening, 3 glasses of wine    Drug use: No       OPHTHALMIC EXAM: Base Eye Exam     Visual Acuity (Snellen - Linear)       Right Left   Dist cc 20/25 20/40 -1   Dist ph cc NI NI    Correction: Glasses         Tonometry (Tonopen, 8:59 AM)       Right Left   Pressure 54 45         Tonometry #2 (Tonopen, 9:28 AM)       Right Left   Pressure 15 14         Tonometry Comments   I gtts cosopt brim        Pupils       Pupils Dark Light Shape React APD   Right PERRL 3 2 Round Brisk None   Left PERRL 3 2 Round Brisk None         Visual Fields       Left Right    Full Full         Extraocular Movement       Right Left    Full, Ortho Full, Ortho  Neuro/Psych     Oriented x3: Yes   Mood/Affect: Normal         Dilation      Both eyes: 2.5% Phenylephrine @ 9:03 AM           Slit Lamp and Fundus Exam     Slit Lamp Exam       Right Left   Lids/Lashes Dermatochalasis - upper lid, Meibomian gland dysfunction Dermatochalasis - upper lid, Meibomian gland dysfunction   Conjunctiva/Sclera Melanosis, 1+ Injection Melanosis, 1+ Injection   Cornea 1+ fine Punctate epithelial erosions, arcus Trace Punctate epithelial erosions, arcus   Anterior Chamber Deep and quiet deep and clear, no cell or flare   Iris Round and moderately dilated, No NVI Round and moderately dilated to 5.19m, No NVI   Lens PC IOL in good position with open PC PC IOL in good position, trace PCO   Anterior Vitreous Vitreous syneresis, scattered silicone oil bubbles Vitreous syneresis, silicone oil bubbles         Fundus Exam       Right Left   Disc Mild Pallor, +cupping, Thin inferior rim mild pallor, Sharp rim, Peripapillary atrophy, Thin inferior rim   C/D Ratio 0.7 0.6   Macula Flat, Blunted foveal reflex, Drusen, RPE mottling and clumping, mild ERM, No heme or edema blunted foveal reflex, RPE clumping and atrophy, persistent, peripapillary SRF -- improved, punctate MA / IRH +ERM, pigmented atrophy/scarring IN macula   Vessels attenuated, mild attenuation, mild tortuosity attenuated, mild tortuosity   Periphery Attached    Attached, bullous schisis cavity from 0200-0430 -- stable; no RT/RD           Refraction     Wearing Rx       Sphere Cylinder Axis Add   Right -0.75 +2.00 163 +1.75   Left +0.00 +1.25 169 +1.75           IMAGING AND PROCEDURES  Imaging and Procedures for 05/07/2022  OCT, Retina - OU - Both Eyes       Right Eye Quality was good. Central Foveal Thickness: 225. Progression has been stable. Findings include normal foveal contour, no IRF, no SRF, retinal drusen , subretinal hyper-reflective material, intraretinal hyper-reflective material, pigment epithelial detachment, vitreomacular adhesion .   Left  Eye Quality was good. Central Foveal Thickness: 342. Progression has improved. Findings include no IRF, abnormal foveal contour, retinal drusen , subretinal hyper-reflective material, choroidal neovascular membrane, pigment epithelial detachment, subretinal fluid, outer retinal atrophy, vitreomacular adhesion (Interval improvement in SRF overlying peripapillary CNV).   Notes *Images captured and stored on drive  Diagnosis / Impression:  OD: nonexudative ARMD OS: exudative ARMD / PCV -- Interval improvement in SRF overlying peripapillary CNV  Clinical management:  See below  Abbreviations: NFP - Normal foveal profile. CME - cystoid macular edema. PED - pigment epithelial detachment. IRF - intraretinal fluid. SRF - subretinal fluid. EZ - ellipsoid zone. ERM - epiretinal membrane. ORA - outer retinal atrophy. ORT - outer retinal tubulation. SRHM - subretinal hyper-reflective material. IRHM - intraretinal hyper-reflective material      Intravitreal Injection, Pharmacologic Agent - OS - Left Eye       Time Out 05/07/2022. 9:35 AM. Confirmed correct patient, procedure, site, and patient consented.   Anesthesia Topical anesthesia was used. Anesthetic medications included Lidocaine 2%, Proparacaine 0.5%.   Procedure Preparation included 5% betadine to ocular surface, eyelid speculum. A (32g) needle was used.   Injection: 1.25 mg Bevacizumab 1.'25mg'$ /0.036m  Route: Intravitreal, Site:  Left Eye   NDC: [id number], LotAD:[id number], Expiration date: 06/27/2022   Post-op Post injection exam found visual acuity of at least counting fingers. The patient tolerated the procedure well. There were no complications. The patient received written and verbal post procedure care education.            ASSESSMENT/PLAN:   ICD-10-CM   1. 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 Essential hypertension  I10     6. Hypertensive retinopathy of both eyes  H35.033     7. Pseudophakia, both eyes  Z96.1     8. Bilateral ocular hypertension  H40.053      1-3. Idiopathic polypoidal choroidal vasculopathy (PCV) OU  - lost to follow up for 6 mos from 07.18.23 to 01.26.24  - Exudative age related macular degeneration, OS (PCV)  - Nonexudative ARMD OD  - was seen in 2016 here by JDM  - transferred care to [facility] w/ Dr. Raylene Miyamoto -- received intravitreal injxns  - then transferred to Dr. Annia Belt -- last visit 07/2019 -- IVE OD #7 for DME  - has seen Dr. Manuella Ghazi twice  - FA (11.29.21) shows peripapillary CNV OS + peripheral leakage OU consistent w/ PCV  - ICG (02.28.22) shows polypoidal choroidal vasculopathy (PCV); Focal hyper cyanscence superior to disc -- cluster of polyps   - here, s/p IVA OS #1 (11.29.21), #2 (12.31.21), #3 (01.31.22), #4 (02.28.22), #5 (03.30.22), #6 (04.27.22), #7 (05.27.22), # 8 (06.24.22), #9 (07.25.22), #10 (08.29.22--JDM), #11 (09.26.22), #12 (10.24.22), #13 (11.28.22), #14 (01.23.23, #15 (03.20.23), [**10 moGAP in f/u**] #16 (01.26.24)  - OCT shows OS Interval improvement in SRF overlying peripapillary CNV  - recommend IVA OS #18 today, 02.23.24  - pt wishes to proceed with injection  - RBA of procedure discussed, questions answered - see procedure note  - BCVA OD: 20/25 - stable, OS: 20/30 -- stable - informed consent obtained, signed and scanned  - f/u 4 weeks -- DFE/OCT, possible injection--scan through schisis OS (0200-0430)  4. Retinoschisis OS  - focal, bullous schisis cavity in temporal periphery 2-430  - widefield OCT confirms schisis w/o RD  - stable  - continue  monitoring for now  - may need laser if retinoschisis progresses posteriorly  5,6. Hypertensive retinopathy OU - discussed importance of tight BP control - monitor  7. Pseudophakia OU  - s/p CE/IOL OU (Dr. Prudencio Burly)  - IOLs in good position, doing well - monitor  8. Ocular hypertension OU  - IOP was 54,45 checked by tech -- one drop of brimonidine and one drop of Cosopt given  - IOP checked again (3x) by MD -- IOP improved to 15,14  - pt is taking latanoprost and Cosopt -- states she has been out of Cosopt for "over a week"  - will refill Cosopt -- BID OU  Ophthalmic Meds Ordered this visit:  Meds ordered this encounter  Medications   dorzolamide-timolol (COSOPT) 2-0.5 % ophthalmic solution    Sig: Place 1 drop into both eyes 2 (two) times daily.    Dispense:  10 mL  Refill:  3   DISCONTD: Bromfenac Sodium (PROLENSA) 0.07 % SOLN    Sig: Place 1 drop into both eyes 4 (four) times daily.    Dispense:  3 mL    Refill:  6   Bevacizumab (AVASTIN) SOLN 1.25 mg   Bromfenac Sodium (PROLENSA) 0.07 % SOLN    Sig: Place 1 drop into both eyes 4 (four) times daily.    Dispense:  3 mL    Refill:  6     Return in about 4 weeks (around 06/04/2022) for f/u CNV OU, DFE, OCT.  There are no Patient Instructions on file for this visit.  This document serves as a record of services personally performed by Gardiner Sleeper, MD, PhD. It was created on their behalf by Joetta Manners COT, an ophthalmic technician. The creation of this record is the provider's dictation and/or activities during the visit.    Electronically signed by: Joetta Manners COT 05/06/22 10:34 AM  This document serves as a record of services personally performed by Gardiner Sleeper, MD, PhD. It was created on their behalf by San Jetty. Owens Shark, OA an ophthalmic technician. The creation of this record is the provider's dictation and/or activities during the visit.    Electronically signed by: San Jetty. Owens Shark, [place]  02.23.2024 10:34 AM  Gardiner Sleeper, M.D., Ph.D. Diseases & Surgery of the Retina and [place] 05/07/2022   I have reviewed the above documentation for accuracy and completeness, and I agree with the above. Gardiner Sleeper, M.D., Ph.D. 05/08/22 10:34 AM  Abbreviations: M myopia (nearsighted); A astigmatism; H hyperopia (farsighted); P presbyopia; Mrx spectacle prescription;  CTL contact lenses; OD right eye; OS left eye; OU both eyes  XT exotropia; ET esotropia; PEK punctate epithelial keratitis; PEE punctate epithelial erosions; DES dry eye syndrome; MGD meibomian gland dysfunction; ATs artificial tears; PFAT's preservative free artificial tears; [place] nuclear sclerotic cataract; PSC posterior subcapsular cataract; ERM epi-retinal membrane; PVD posterior vitreous detachment; RD retinal detachment; DM diabetes mellitus; DR diabetic retinopathy; NPDR non-proliferative diabetic retinopathy; PDR proliferative diabetic retinopathy; CSME clinically significant macular edema; DME diabetic macular edema; dbh dot blot hemorrhages; CWS cotton wool spot; POAG primary open angle glaucoma; C/D cup-to-disc ratio; HVF humphrey visual field; GVF goldmann visual field; OCT optical coherence tomography; IOP intraocular pressure; BRVO Branch retinal vein occlusion; CRVO central retinal vein occlusion; CRAO central retinal artery occlusion; BRAO branch retinal artery occlusion; RT retinal tear; SB scleral buckle; PPV pars plana vitrectomy; VH Vitreous hemorrhage; PRP panretinal laser photocoagulation; IVK intravitreal kenalog; VMT vitreomacular traction; MH Macular hole;  NVD neovascularization of the disc; NVE neovascularization elsewhere; AREDS age related eye disease study; ARMD age related macular degeneration; POAG primary open angle glaucoma; EBMD epithelial/anterior basement membrane dystrophy; ACIOL anterior chamber intraocular lens; IOL intraocular lens; PCIOL posterior chamber  intraocular lens; Phaco/IOL phacoemulsification with intraocular lens placement; [place] photorefractive keratectomy; LASIK laser assisted in situ keratomileusis; HTN hypertension; DM diabetes mellitus; COPD chronic obstructive pulmonary disease

## 2022-05-07 ENCOUNTER — Encounter (INDEPENDENT_AMBULATORY_CARE_PROVIDER_SITE_OTHER): Payer: Self-pay | Admitting: Ophthalmology

## 2022-05-07 ENCOUNTER — Ambulatory Visit (INDEPENDENT_AMBULATORY_CARE_PROVIDER_SITE_OTHER): Payer: 59 | Admitting: Ophthalmology

## 2022-05-07 DIAGNOSIS — I10 Essential (primary) hypertension: Secondary | ICD-10-CM | POA: Diagnosis not present

## 2022-05-07 DIAGNOSIS — H35033 Hypertensive retinopathy, bilateral: Secondary | ICD-10-CM | POA: Diagnosis not present

## 2022-05-07 DIAGNOSIS — H318 Other specified disorders of choroid: Secondary | ICD-10-CM

## 2022-05-07 DIAGNOSIS — H33102 Unspecified retinoschisis, left eye: Secondary | ICD-10-CM

## 2022-05-07 DIAGNOSIS — H40053 Ocular hypertension, bilateral: Secondary | ICD-10-CM

## 2022-05-07 DIAGNOSIS — Z961 Presence of intraocular lens: Secondary | ICD-10-CM

## 2022-05-07 DIAGNOSIS — H353221 Exudative age-related macular degeneration, left eye, with active choroidal neovascularization: Secondary | ICD-10-CM

## 2022-05-07 DIAGNOSIS — H353112 Nonexudative age-related macular degeneration, right eye, intermediate dry stage: Secondary | ICD-10-CM | POA: Diagnosis not present

## 2022-05-07 MED ORDER — BROMFENAC SODIUM 0.07 % OP SOLN: 1.0000 [drp] | Freq: Four times a day (QID) | OPHTHALMIC | 6 refills | Status: DC

## 2022-05-07 MED ORDER — DORZOLAMIDE HCL-TIMOLOL MAL 2-0.5 % OP SOLN: 1.0000 [drp] | Freq: Two times a day (BID) | OPHTHALMIC | 3 refills | Status: AC

## 2022-05-07 MED ORDER — BEVACIZUMAB CHEMO INJECTION 1.25MG/0.05ML SYRINGE FOR KALEIDOSCOPE
1.2500 mg | INTRAVITREAL | Status: AC | PRN
  Administered 2022-05-07: 1.25 mg via INTRAVITREAL

## 2022-06-03 NOTE — Progress Notes (Signed)
[facility] Note  06/04/2022     CHIEF COMPLAINT Patient presents for Retina Follow Up  HISTORY OF PRESENT ILLNESS: Kylie Wilson is a 72 y.o. female who presents to the clinic today for:   HPI     Retina Follow Up   Patient presents with  Wet AMD.  In left eye.  This started 4 weeks ago.  I, the attending physician,  performed the HPI with the patient and updated documentation appropriately.        Comments   Patient here for 4 weeks retina follow up for exu ARMD OS. Patient states vision a little better. No eye pain. Been seeing something like a little black ball. Started couple days ago. Doctors found a spot on liver from using BC powder. Still using drops.       Last edited by Bernarda Caffey, MD on 06/04/2022 12:09 PM.    Pt states she started seeing new black floaters in the left eye a few days ago   Referring physician: Oliver Barre, MD [facility] [place] [place], [place]  [id number]  HISTORICAL INFORMATION:   Selected notes from the [place] pt (2016) self-referred for possible RD LEE:  Ocular Hx- PCV diagnosed by Dr. Raylene Miyamoto at [facility]; followed w/ Dr. Irena Reichmann for DM PMH-    CURRENT MEDICATIONS: Current Outpatient Medications (Ophthalmic Drugs)  Medication Sig   Bromfenac Sodium (PROLENSA) 0.07 % SOLN Place 1 drop into both eyes 4 (four) times daily.   dorzolamide-timolol (COSOPT) 2-0.5 % ophthalmic solution Place 1 drop into both eyes 2 (two) times daily.   dorzolamide-timolol (COSOPT) 22.3-6.8 MG/ML ophthalmic solution Place 1 drop into both eyes 2 (two) times daily.   No current facility-administered medications for this visit. (Ophthalmic Drugs)   Current Outpatient Medications (Other)  Medication Sig   ACCU-CHEK AVIVA PLUS test strip USE TO TEST BLOOD SUGARS DAILY.   amlodipine-olmesartan (AZOR) 10-20 MG tablet Take 1 tablet by mouth daily.   aspirin EC  81 MG tablet Take 1 tablet (81 mg total) by mouth daily. Swallow whole.   Blood Glucose Monitoring Suppl (GLUCOCOM BLOOD GLUCOSE MONITOR) DEVI One Touch Verio Meter. FSBS BID. E11.319   Cholecalciferol (VITAMIN D3 PO) Take 5,000 Units by mouth daily.   empagliflozin (JARDIANCE) 25 MG TABS tablet Take 1 tablet by mouth daily.   hydrochlorothiazide (HYDRODIURIL) 25 MG tablet TAKE 1 TABLET BY MOUTH EVERY DAY IN THE MORNING   Lancets (ONETOUCH DELICA PLUS [id number]) MISC Apply topically.   LINZESS 72 MCG capsule Take 72 mcg by mouth daily as needed (abdominal cramping).    metFORMIN (GLUCOPHAGE) 500 MG tablet Take 1,000 mg by mouth 2 (two) times daily with a meal.    metoprolol succinate (TOPROL-XL) 25 MG 24 hr tablet TAKE 1 TAB EVERY MORNING. HOLD IF SYSTOLIC BLOOD PRESSURE (TOP NUMBER) <100 MMHG OR PULSE <60 BPM   omeprazole (PRILOSEC) 40 MG capsule Take 40 mg by mouth every morning.   pregabalin (LYRICA) 75 MG capsule Take by mouth.   rosuvastatin (CRESTOR) 10 MG tablet Take 1 tablet (10 mg total) by mouth at bedtime for 90 doses.   No current facility-administered medications for this visit. (Other)   REVIEW OF SYSTEMS: ROS   Positive for: Gastrointestinal, Neurological, Musculoskeletal, Endocrine, Cardiovascular, Eyes, Respiratory Negative for: Constitutional, Skin, Genitourinary, HENT, Psychiatric, Allergic/Imm, Heme/Lymph Last edited by Theodore Demark, COA on 06/04/2022  9:02 AM.  ALLERGIES Allergies  Allergen Reactions   Duloxetine Other (See Comments)    Pt says it also caused high pressure.   Lisinopril Other (See Comments)    cough   PAST MEDICAL HISTORY Past Medical History:  Diagnosis Date   Arthritis    Barrett's esophagus    Burning pain    Cervical radiculopathy    Cervical spondylosis    Coronary artery calcification    DDD (degenerative disc disease), cervical    Diabetes mellitus    type 2    GERD (gastroesophageal reflux disease)    Hyperlipidemia     Hypertension    Hypertensive retinopathy    OU   Macular degeneration    Dry OD; Wet OS   Obesity    Rectocele    Sleep apnea    does not tolerate cpap   Uterine prolapse    Past Surgical History:  Procedure Laterality Date   BREAST BIOPSY  1980   left   BREAST EXCISIONAL BIOPSY Left    Bening 1980's   CATARACT EXTRACTION Bilateral    Dr. Prudencio Burly   EYE SURGERY Bilateral    Cat Sx - Dr. Prudencio Burly   SCAR REVISION  2001   both legs   TOE SURGERY  1981   4th and 5th toe right foot   TOE SURGERY  2002   great toe left   TONSILLECTOMY     TUBAL LIGATION     FAMILY HISTORY Family History  Problem Relation Age of Onset   Heart disease Father    Pancreatic cancer Father    Breast cancer Paternal Aunt    Stroke Mother    Heart disease Brother    Colon cancer Neg Hx    Stomach cancer Neg Hx    Colon polyps Neg Hx    Rectal cancer Neg Hx    SOCIAL HISTORY Social History   Tobacco Use   Smoking status: Former    Packs/day: 0.80    Years: 30.00    Additional pack years: 0.00    Total pack years: 24.00    Types: Cigarettes    Quit date: 09/15/2011    Years since quitting: 10.7   Smokeless tobacco: Never   Tobacco comments:    smoked 20 years; then quit 10; smoked 30 years plus  Vaping Use   Vaping Use: Never used  Substance Use Topics   Alcohol use: Yes    Comment: Every evening, 3 glasses of wine    Drug use: No       OPHTHALMIC EXAM: Base Eye Exam     Visual Acuity (Snellen - Linear)       Right Left   Dist cc 20/25 20/30 -2   Dist ph cc  NI         Tonometry (Tonopen, 8:59 AM)       Right Left   Pressure 18 20         Pupils       Dark Light Shape React APD   Right 3 2 Round Brisk None   Left 3 2 Round Brisk None         Visual Fields (Counting fingers)       Left Right    Full Full         Extraocular Movement       Right Left    Full, Ortho Full, Ortho         Neuro/Psych     Oriented x3: Yes  Mood/Affect: Normal          Dilation     Both eyes: 1.0% Mydriacyl, 2.5% Phenylephrine @ 8:59 AM           Slit Lamp and Fundus Exam     Slit Lamp Exam       Right Left   Lids/Lashes Dermatochalasis - upper lid, Meibomian gland dysfunction Dermatochalasis - upper lid, Meibomian gland dysfunction   Conjunctiva/Sclera Melanosis, 1+ Injection Melanosis, 1+ Injection   Cornea 1+ fine Punctate epithelial erosions, arcus Trace Punctate epithelial erosions, arcus   Anterior Chamber Deep and quiet deep and clear, no cell or flare   Iris Round and moderately dilated, No NVI Round and moderately dilated to 5.50mm, No NVI   Lens PC IOL in good position with open PC PC IOL in good position, trace PCO   Anterior Vitreous Vitreous syneresis, scattered silicone oil bubbles Vitreous syneresis, silicone oil bubbles         Fundus Exam       Right Left   Disc Mild Pallor, +cupping, Thin inferior rim mild pallor, Sharp rim, Peripapillary atrophy, Thin inferior rim   C/D Ratio 0.7 0.6   Macula Flat, Blunted foveal reflex, Drusen, RPE mottling and clumping, mild ERM, No heme or edema blunted foveal reflex, RPE clumping and atrophy, persistent, peripapillary SRF -- improved, punctate MA / IRH, +ERM, pigmented atrophy/scarring IN macula   Vessels attenuated, Tortuous attenuated, mild tortuosity   Periphery Attached    Attached, bullous schisis cavity from 0200-0430 -- stable; no RT/RD           Refraction     Wearing Rx       Sphere Cylinder Axis Add   Right -0.75 +2.00 163 +1.75   Left +0.00 +1.25 169 +1.75           IMAGING AND PROCEDURES  Imaging and Procedures for 06/04/2022  OCT, Retina - OU - Both Eyes       Right Eye Quality was good. Central Foveal Thickness: 219. Progression has been stable. Findings include normal foveal contour, no IRF, no SRF, retinal drusen , subretinal hyper-reflective material, intraretinal hyper-reflective material, pigment epithelial detachment, outer retinal atrophy,  vitreomacular adhesion .   Left Eye Quality was good. Central Foveal Thickness: 317. Progression has improved. Findings include no IRF, abnormal foveal contour, retinal drusen , subretinal hyper-reflective material, choroidal neovascular membrane, pigment epithelial detachment, subretinal fluid, outer retinal atrophy, vitreomacular adhesion (Mild interval improvement in SRF overlying peripapillary CNV).   Notes *Images captured and stored on drive  Diagnosis / Impression:  OD: nonexudative ARMD OS: exudative ARMD / PCV -- mild interval improvement in SRF overlying peripapillary CNV  Clinical management:  See below  Abbreviations: NFP - Normal foveal profile. CME - cystoid macular edema. PED - pigment epithelial detachment. IRF - intraretinal fluid. SRF - subretinal fluid. EZ - ellipsoid zone. ERM - epiretinal membrane. ORA - outer retinal atrophy. ORT - outer retinal tubulation. SRHM - subretinal hyper-reflective material. IRHM - intraretinal hyper-reflective material      Intravitreal Injection, Pharmacologic Agent - OS - Left Eye       Time Out 06/04/2022. 9:42 AM. Confirmed correct patient, procedure, site, and patient consented.   Anesthesia Topical anesthesia was used. Anesthetic medications included Lidocaine 2%, Proparacaine 0.5%.   Procedure Preparation included 5% betadine to ocular surface, eyelid speculum. A (32g) needle was used.   Injection: 1.25 mg Bevacizumab 1.25mg /0.58ml   Route: Intravitreal, Site: Left Eye   NDC: [id number],  Lot: [id number], Expiration date: 07/11/2022   Post-op Post injection exam found visual acuity of at least counting fingers. The patient tolerated the procedure well. There were no complications. The patient received written and verbal post procedure care education.            ASSESSMENT/PLAN:   ICD-10-CM   1. 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 Essential hypertension  I10     6. Hypertensive retinopathy of both eyes  H35.033     7. Pseudophakia, both eyes  Z96.1     8. Bilateral ocular hypertension  H40.053       1-3. Idiopathic polypoidal choroidal vasculopathy (PCV) OU  - lost to follow up for 6 mos from 07.18.23 to 01.26.24  - Exudative age related macular degeneration, OS (PCV)  - Nonexudative ARMD OD  - was seen in 2016 here by JDM  - transferred care to [facility] w/ Dr. Raylene Miyamoto -- received intravitreal injxns  - then transferred to Dr. Annia Belt -- last visit 07/2019 -- IVE OD #7 for DME  - has seen Dr. Manuella Ghazi twice  - FA (11.29.21) shows peripapillary CNV OS + peripheral leakage OU consistent w/ PCV  - ICG (02.28.22) shows polypoidal choroidal vasculopathy (PCV); Focal hyper cyanscence superior to disc -- cluster of polyps   - here, s/p IVA OS #1 (11.29.21), #2 (12.31.21), #3 (01.31.22), #4 (02.28.22), #5 (03.30.22), #6 (04.27.22), #7 (05.27.22), # 8 (06.24.22), #9 (07.25.22), #10 (08.29.22--JDM), #11 (09.26.22), #12 (10.24.22), #13 (11.28.22), #14 (01.23.23, #15 (03.20.23), [**10 moGAP in f/u**] #16 (01.26.24) #17 (02.23.24)  - OCT shows OS Interval improvement in SRF overlying peripapillary CNV  - recommend IVA OS #18 today, 03.22.24  - pt wishes to proceed with injection  - RBA of procedure discussed, questions answered - see procedure note  - BCVA OD: 20/25 - stable, OS: 20/30 -- stable - informed consent obtained, signed and scanned  - f/u 4 weeks -- DFE/OCT, possible injection -- scan through schisis OS (0200-0430)  4. Retinoschisis OS  - focal, bullous schisis cavity in temporal periphery 2-430  -  widefield OCT confirms schisis w/o RD  - stable  - continue monitoring for now  - may need laser if retinoschisis progresses posteriorly  5,6. Hypertensive retinopathy OU - discussed importance of tight BP control - monitor  7. Pseudophakia OU  - s/p CE/IOL OU (Dr. Prudencio Burly)  - IOLs in good position, doing well - monitor  8. Ocular hypertension OU  - IOP 18,20  - pt is taking latanoprost and Cosopt  Ophthalmic Meds Ordered this visit:  Meds ordered this encounter  Medications   Bevacizumab (AVASTIN) SOLN 1.25 mg     Return in about 4 weeks (around 07/02/2022).  There are no Patient Instructions on file for this visit.  This document serves as a record of services personally performed by Gardiner Sleeper, MD, PhD. It was created on their behalf by Joetta Manners COT, an ophthalmic technician. The creation of this record is the provider's dictation and/or activities during the visit.    Electronically signed by: Anderson Malta  Mendenhall COT 06/03/2022 12:18 PM  This document serves as a record of services personally performed by Gardiner Sleeper, MD, PhD. It was created on their behalf by San Jetty. Owens Shark, OA an ophthalmic technician. The creation of this record is the provider's dictation and/or activities during the visit.    Electronically signed by: San Jetty. Marguerita Merles 03.22.2024 12:18 PM  Gardiner Sleeper, M.D., Ph.D. Diseases & Surgery of the Retina and [place] 06/04/2022   I have reviewed the above documentation for accuracy and completeness, and I agree with the above. Gardiner Sleeper, M.D., Ph.D. 06/04/22 12:19 PM   Abbreviations: M myopia (nearsighted); A astigmatism; H hyperopia (farsighted); P presbyopia; Mrx spectacle prescription;  CTL contact lenses; OD right eye; OS left eye; OU both eyes  XT exotropia; ET esotropia; PEK punctate epithelial keratitis; PEE punctate epithelial erosions; DES dry eye syndrome; MGD meibomian gland  dysfunction; ATs artificial tears; PFAT's preservative free artificial tears; [place] nuclear sclerotic cataract; PSC posterior subcapsular cataract; ERM epi-retinal membrane; PVD posterior vitreous detachment; RD retinal detachment; DM diabetes mellitus; DR diabetic retinopathy; NPDR non-proliferative diabetic retinopathy; PDR proliferative diabetic retinopathy; CSME clinically significant macular edema; DME diabetic macular edema; dbh dot blot hemorrhages; CWS cotton wool spot; POAG primary open angle glaucoma; C/D cup-to-disc ratio; HVF humphrey visual field; GVF goldmann visual field; OCT optical coherence tomography; IOP intraocular pressure; BRVO Branch retinal vein occlusion; CRVO central retinal vein occlusion; CRAO central retinal artery occlusion; BRAO branch retinal artery occlusion; RT retinal tear; SB scleral buckle; PPV pars plana vitrectomy; VH Vitreous hemorrhage; PRP panretinal laser photocoagulation; IVK intravitreal kenalog; VMT vitreomacular traction; MH Macular hole;  NVD neovascularization of the disc; NVE neovascularization elsewhere; AREDS age related eye disease study; ARMD age related macular degeneration; POAG primary open angle glaucoma; EBMD epithelial/anterior basement membrane dystrophy; ACIOL anterior chamber intraocular lens; IOL intraocular lens; PCIOL posterior chamber intraocular lens; Phaco/IOL phacoemulsification with intraocular lens placement; [place] photorefractive keratectomy; LASIK laser assisted in situ keratomileusis; HTN hypertension; DM diabetes mellitus; COPD chronic obstructive pulmonary disease

## 2022-06-04 ENCOUNTER — Encounter (INDEPENDENT_AMBULATORY_CARE_PROVIDER_SITE_OTHER): Payer: Self-pay | Admitting: Ophthalmology

## 2022-06-04 ENCOUNTER — Ambulatory Visit (INDEPENDENT_AMBULATORY_CARE_PROVIDER_SITE_OTHER): Payer: 59 | Admitting: Ophthalmology

## 2022-06-04 DIAGNOSIS — H353221 Exudative age-related macular degeneration, left eye, with active choroidal neovascularization: Secondary | ICD-10-CM

## 2022-06-04 DIAGNOSIS — H353112 Nonexudative age-related macular degeneration, right eye, intermediate dry stage: Secondary | ICD-10-CM | POA: Diagnosis not present

## 2022-06-04 DIAGNOSIS — I10 Essential (primary) hypertension: Secondary | ICD-10-CM

## 2022-06-04 DIAGNOSIS — H318 Other specified disorders of choroid: Secondary | ICD-10-CM | POA: Diagnosis not present

## 2022-06-04 DIAGNOSIS — H35033 Hypertensive retinopathy, bilateral: Secondary | ICD-10-CM

## 2022-06-04 DIAGNOSIS — H33102 Unspecified retinoschisis, left eye: Secondary | ICD-10-CM

## 2022-06-04 DIAGNOSIS — H40053 Ocular hypertension, bilateral: Secondary | ICD-10-CM

## 2022-06-04 DIAGNOSIS — Z961 Presence of intraocular lens: Secondary | ICD-10-CM

## 2022-06-04 MED ORDER — BEVACIZUMAB CHEMO INJECTION 1.25MG/0.05ML SYRINGE FOR KALEIDOSCOPE
1.2500 mg | INTRAVITREAL | Status: AC | PRN
  Administered 2022-06-04: 1.25 mg via INTRAVITREAL

## 2022-07-01 NOTE — Progress Notes (Signed)
[facility] - Clinic Note  07/02/2022     CHIEF COMPLAINT Patient presents for Retina Follow Up  HISTORY OF PRESENT ILLNESS: Kylie Wilson is a 72 y.o. female who presents to the clinic today for:   HPI     Retina Follow Up   Patient presents with  Wet AMD.  In left eye.  Severity is moderate.  Duration of 4 weeks.  Since onset it is stable.  I, the attending physician,  performed the HPI with the patient and updated documentation appropriately.        Comments   Patient states some discomfort OS. Occasional discharge OS for the past week or so. BS was 128 this am. Last A1c was around 8 a few months ago. Using cosopt bid OU. Last used prolensa last week. No prolensa this week.Vision seems the same OU.      Last edited by Rennis Chris, MD on 07/02/2022  3:26 PM.     Pt states vision is no worse, she was taking Prolensa TID until last week, but she states she has had some pain this week   Referring physician: Carollee Massed, MD [facility] [address] [place], [place]  [address]  HISTORICAL INFORMATION:   Selected notes from the MEDICAL RECORD NUMBER Previous [name] pt (2016) self-referred for possible RD LEE:  Ocular Hx- PCV diagnosed by Dr. Dorette Grate at [facility]; followed w/ Dr. Rhina Brackett for DM PMH-    CURRENT MEDICATIONS: Current Outpatient Medications (Ophthalmic Drugs)  Medication Sig   dorzolamide-timolol (COSOPT) 2-0.5 % ophthalmic solution Place 1 drop into both eyes 2 (two) times daily.   Bromfenac Sodium (PROLENSA) 0.07 % SOLN Place 1 drop into both eyes 4 (four) times daily. (Patient not taking: Reported on 07/02/2022)   dorzolamide-timolol (COSOPT) 22.3-6.8 MG/ML ophthalmic solution Place 1 drop into both eyes 2 (two) times daily.   No current facility-administered medications for this visit. (Ophthalmic Drugs)   Current Outpatient Medications (Other)  Medication Sig   ACCU-CHEK AVIVA PLUS test strip USE  TO TEST BLOOD SUGARS DAILY.   amlodipine-olmesartan (AZOR) 10-20 MG tablet Take 1 tablet by mouth daily.   aspirin EC 81 MG tablet Take 1 tablet (81 mg total) by mouth daily. Swallow whole.   Blood Glucose Monitoring Suppl (GLUCOCOM BLOOD GLUCOSE MONITOR) DEVI One Touch Verio Meter. FSBS BID. E11.319   Cholecalciferol (VITAMIN D3 PO) Take 5,000 Units by mouth daily.   empagliflozin (JARDIANCE) 25 MG TABS tablet Take 1 tablet by mouth daily.   hydrochlorothiazide (HYDRODIURIL) 25 MG tablet TAKE 1 TABLET BY MOUTH EVERY DAY IN THE MORNING   Lancets (ONETOUCH DELICA PLUS LANCET33G) MISC Apply topically.   LINZESS 72 MCG capsule Take 72 mcg by mouth daily as needed (abdominal cramping).    metFORMIN (GLUCOPHAGE) 500 MG tablet Take 1,000 mg by mouth 2 (two) times daily with a meal.    metoprolol succinate (TOPROL-XL) 25 MG 24 hr tablet TAKE 1 TAB EVERY MORNING. HOLD IF SYSTOLIC BLOOD PRESSURE (TOP NUMBER) <100 MMHG OR PULSE <60 BPM   omeprazole (PRILOSEC) 40 MG capsule Take 40 mg by mouth every morning.   pregabalin (LYRICA) 75 MG capsule Take by mouth.   rosuvastatin (CRESTOR) 10 MG tablet Take 1 tablet (10 mg total) by mouth at bedtime for 90 doses.   No current facility-administered medications for this visit. (Other)   REVIEW OF SYSTEMS: ROS   Positive for: Gastrointestinal, Neurological, Musculoskeletal, Endocrine, Cardiovascular, Eyes, Respiratory Negative for:  Constitutional, Skin, Genitourinary, HENT, Psychiatric, Allergic/Imm, Heme/Lymph Last edited by Annalee Genta D, COT on 07/02/2022  2:02 PM.     ALLERGIES Allergies  Allergen Reactions   Duloxetine Other (See Comments)    Pt says it also caused high pressure.   Lisinopril Other (See Comments)    cough   PAST MEDICAL HISTORY Past Medical History:  Diagnosis Date   Arthritis    Barrett's esophagus    Burning pain    Cervical radiculopathy    Cervical spondylosis    Coronary artery calcification    DDD (degenerative disc  disease), cervical    Diabetes mellitus    type 2    GERD (gastroesophageal reflux disease)    Hyperlipidemia    Hypertension    Hypertensive retinopathy    OU   Macular degeneration    Dry OD; Wet OS   Obesity    Rectocele    Sleep apnea    does not tolerate cpap   Uterine prolapse    Past Surgical History:  Procedure Laterality Date   BREAST BIOPSY  1980   left   BREAST EXCISIONAL BIOPSY Left    Bening 1980's   CATARACT EXTRACTION Bilateral    Dr. Randon Goldsmith   EYE SURGERY Bilateral    Cat Sx - Dr. Randon Goldsmith   SCAR REVISION  2001   both legs   TOE SURGERY  1981   4th and 5th toe right foot   TOE SURGERY  2002   great toe left   TONSILLECTOMY     TUBAL LIGATION     FAMILY HISTORY Family History  Problem Relation Age of Onset   Heart disease Father    Pancreatic cancer Father    Breast cancer Paternal Aunt    Stroke Mother    Heart disease Brother    Colon cancer Neg Hx    Stomach cancer Neg Hx    Colon polyps Neg Hx    Rectal cancer Neg Hx    SOCIAL HISTORY Social History   Tobacco Use   Smoking status: Former    Packs/day: 0.80    Years: 30.00    Additional pack years: 0.00    Total pack years: 24.00    Types: Cigarettes    Quit date: 09/15/2011    Years since quitting: 10.8   Smokeless tobacco: Never   Tobacco comments:    smoked 20 years; then quit 10; smoked 30 years plus  Vaping Use   Vaping Use: Never used  Substance Use Topics   Alcohol use: Yes    Comment: Every evening, 3 glasses of wine    Drug use: No       OPHTHALMIC EXAM: Base Eye Exam     Visual Acuity (Snellen - Linear)       Right Left   Dist cc 20/25 20/30 -1   Dist ph cc NI NI    Correction: Glasses         Tonometry (Tonopen, 2:11 PM)       Right Left   Pressure 17 19         Pupils       Dark Light Shape React APD   Right 3 2 Round Brisk None   Left 3 2 Round Brisk None         Visual Fields (Counting fingers)       Left Right    Full Full          Extraocular Movement  Right Left    Full, Ortho Full, Ortho         Neuro/Psych     Oriented x3: Yes   Mood/Affect: Normal         Dilation     Both eyes: 1.0% Mydriacyl, 2.5% Phenylephrine @ 2:11 PM           Slit Lamp and Fundus Exam     Slit Lamp Exam       Right Left   Lids/Lashes Dermatochalasis - upper lid, Meibomian gland dysfunction Dermatochalasis - upper lid, Meibomian gland dysfunction   Conjunctiva/Sclera Melanosis, 1+ Injection Melanosis, 1+ Injection   Cornea 1+ fine Punctate epithelial erosions, arcus Trace Punctate epithelial erosions, arcus   Anterior Chamber Deep and quiet deep and clear, no cell or flare   Iris Round and moderately dilated, No NVI Round and moderately dilated to 5.67mm, No NVI   Lens PC IOL in good position with open PC PC IOL in good position, trace PCO   Anterior Vitreous Vitreous syneresis, scattered silicone oil bubbles Vitreous syneresis, silicone oil bubbles         Fundus Exam       Right Left   Disc Mild Pallor, +cupping, Thin inferior rim mild pallor, Sharp rim, Peripapillary atrophy, Thin inferior rim   C/D Ratio 0.7 0.6   Macula Flat, Blunted foveal reflex, Drusen, RPE mottling and clumping, mild ERM, No heme or edema blunted foveal reflex, RPE clumping and atrophy, persistent, peripapillary SRF, punctate MA / IRH, +ERM, pigmented atrophy/scarring IN macula   Vessels attenuated, Tortuous attenuated, mild tortuosity   Periphery Attached    Attached, bullous schisis cavity from 0200-0430 -- stable; no RT/RD           Refraction     Wearing Rx       Sphere Cylinder Axis Add   Right -0.75 +2.00 163 +1.75   Left +0.00 +1.25 169 +1.75           IMAGING AND PROCEDURES  Imaging and Procedures for 07/02/2022  OCT, Retina - OU - Both Eyes       Right Eye Quality was good. Central Foveal Thickness: 221. Progression has been stable. Findings include normal foveal contour, no IRF, no SRF, retinal drusen ,  subretinal hyper-reflective material, intraretinal hyper-reflective material, pigment epithelial detachment, outer retinal atrophy, vitreomacular adhesion .   Left Eye Quality was good. Central Foveal Thickness: 348. Progression has worsened. Findings include no IRF, abnormal foveal contour, retinal drusen , subretinal hyper-reflective material, choroidal neovascular membrane, pigment epithelial detachment, subretinal fluid, outer retinal atrophy, vitreomacular adhesion (persistent SRF overlying peripapillary CNV -- slightly increased; peripheral schisis temporal periphery caught on widefield).   Notes *Images captured and stored on drive  Diagnosis / Impression:  OD: nonexudative ARMD OS: exudative ARMD / PCV -- persistent SRF overlying peripapillary CNV -- slightly increased; peripheral schisis temporal periphery caught on widefield  Clinical management:  See below  Abbreviations: NFP - Normal foveal profile. CME - cystoid macular edema. PED - pigment epithelial detachment. IRF - intraretinal fluid. SRF - subretinal fluid. EZ - ellipsoid zone. ERM - epiretinal membrane. ORA - outer retinal atrophy. ORT - outer retinal tubulation. SRHM - subretinal hyper-reflective material. IRHM - intraretinal hyper-reflective material      Intravitreal Injection, Pharmacologic Agent - OS - Left Eye       Time Out 07/02/2022. 3:20 PM. Confirmed correct patient, procedure, site, and patient consented.   Anesthesia Topical anesthesia was used. Anesthetic medications included Lidocaine  2%, Proparacaine 0.5%.   Procedure Preparation included 5% betadine to ocular surface, eyelid speculum. A (32g) needle was used.   Injection: 2 mg aflibercept 2 MG/0.05ML   Route: Intravitreal, Site: Left Eye   NDC: L6038910, Lot: [id number], Expiration date: 07/13/2023, Waste: 0 mL   Post-op Post injection exam found visual acuity of at least counting fingers. The patient tolerated the procedure well. There were  no complications. The patient received written and verbal post procedure care education. Post injection medications were not given.             ASSESSMENT/PLAN:   ICD-10-CM   1. 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 Essential hypertension  I10     6. Hypertensive retinopathy of both eyes  H35.033     7. Pseudophakia, both eyes  Z96.1     8. Bilateral ocular hypertension  H40.053      1-3. Idiopathic polypoidal choroidal vasculopathy (PCV) OU  - lost to follow up for 6 mos from 07.18.23 to 01.26.24  - Exudative age related macular degeneration, OS (PCV)  - Nonexudative ARMD OD  - was seen in 2016 here by JDM  - transferred care to [facility] w/ Dr. Dorette Grate -- received intravitreal injxns  - then transferred to Dr. Quinn Axe -- last visit 07/2019 -- IVE OD #7 for DME  - has seen Dr. Sherryll Burger twice  - FA (11.29.21) shows peripapillary CNV OS + peripheral leakage OU consistent w/ PCV  - ICG (02.28.22) shows polypoidal choroidal vasculopathy (PCV); Focal hyper cyanscence superior to disc -- cluster of polyps   - here, s/p IVA OS #1 (11.29.21), #2 (12.31.21), #3 (01.31.22), #4 (02.28.22), #5 (03.30.22), #6 (04.27.22), #7 (05.27.22), # 8 (06.24.22), #9 (07.25.22), #10 (08.29.22--JDM), #11 (09.26.22), #12 (10.24.22), #13 (11.28.22), #14 (01.23.23, #15 (03.20.23), [**10 moGAP in f/u**] #16 (01.26.24) #17 (02.23.24) #18(03.22.24)  - OCT shows OS persistent SRF overlying peripapillary CNV -- slightly increased; peripheral schisis temporal periphery caught on widefield at 4 wks  - discussed IVA resistance and  potential benefit of switching medication  - recommend switching to IVE OS #1 today, 04.19.24 w/ f/u in 4 wks  - pt wishes to proceed with injection  - RBA of procedure discussed, questions answered - see procedure note  - BCVA OD: 20/25 - stable, OS: 20/30 -- stable - IVE informed consent obtained, signed and scanned, 04.19.24 (OS)  - f/u 4 weeks -- DFE/OCT, possible injection -- scan through schisis OS (0200-0430)  4. Retinoschisis OS  - focal, bullous schisis cavity in temporal periphery 2-430  - widefield OCT confirms schisis w/o RD  - stable  - continue monitoring for now  - may need laser if retinoschisis progresses posteriorly  5,6. Hypertensive retinopathy OU - discussed importance of tight BP control - monitor  7. Pseudophakia OU  - s/p CE/IOL OU (Dr. Randon Goldsmith)  - IOLs in good position, doing well - monitor  8. Ocular hypertension OU  - IOP 17,19  - pt is taking latanoprost and Cosopt  Ophthalmic Meds Ordered this visit:  Meds ordered this encounter  Medications  aflibercept (EYLEA) SOLN 2 mg     Return in about 4 weeks (around 07/30/2022) for f/u exu ARMD OS, DFE, OCT.  There are no Patient Instructions on file for this visit.  This document serves as a record of services personally performed by Karie Chimera, MD, PhD. It was created on their behalf by Berlin Hun COT, an ophthalmic technician. The creation of this record is the provider's dictation and/or activities during the visit.    Electronically signed by: Berlin Hun COT 07/01/2022 2:07 AM  This document serves as a record of services personally performed by Karie Chimera, MD, PhD. It was created on their behalf by Glee Arvin. Manson Passey, OA an ophthalmic technician. The creation of this record is the provider's dictation and/or activities during the visit.    Electronically signed by: Glee Arvin. Manson Passey, [place] 04.19.2024 2:07 AM  Karie Chimera, M.D., Ph.D. Diseases & Surgery of the Retina and  Vitreous [facility] 07/02/2022   I have reviewed the above documentation for accuracy and completeness, and I agree with the above. Karie Chimera, M.D., Ph.D. 07/04/22 2:09 AM  Abbreviations: M myopia (nearsighted); A astigmatism; H hyperopia (farsighted); P presbyopia; Mrx spectacle prescription;  CTL contact lenses; OD right eye; OS left eye; OU both eyes  XT exotropia; ET esotropia; PEK punctate epithelial keratitis; PEE punctate epithelial erosions; DES dry eye syndrome; MGD meibomian gland dysfunction; ATs artificial tears; PFAT's preservative free artificial tears; NSC nuclear sclerotic cataract; PSC posterior subcapsular cataract; ERM epi-retinal membrane; PVD posterior vitreous detachment; RD retinal detachment; DM diabetes mellitus; DR diabetic retinopathy; NPDR non-proliferative diabetic retinopathy; PDR proliferative diabetic retinopathy; CSME clinically significant macular edema; DME diabetic macular edema; dbh dot blot hemorrhages; CWS cotton wool spot; POAG primary open angle glaucoma; C/D cup-to-disc ratio; HVF humphrey visual field; GVF goldmann visual field; OCT optical coherence tomography; IOP intraocular pressure; BRVO Branch retinal vein occlusion; CRVO central retinal vein occlusion; CRAO central retinal artery occlusion; BRAO branch retinal artery occlusion; RT retinal tear; SB scleral buckle; PPV pars plana vitrectomy; VH Vitreous hemorrhage; PRP panretinal laser photocoagulation; IVK intravitreal kenalog; VMT vitreomacular traction; MH Macular hole;  NVD neovascularization of the disc; NVE neovascularization elsewhere; AREDS age related eye disease study; ARMD age related macular degeneration; POAG primary open angle glaucoma; EBMD epithelial/anterior basement membrane dystrophy; ACIOL anterior chamber intraocular lens; IOL intraocular lens; PCIOL posterior chamber intraocular lens; Phaco/IOL phacoemulsification with intraocular lens placement; PRK  photorefractive keratectomy; LASIK laser assisted in situ keratomileusis; HTN hypertension; DM diabetes mellitus; COPD chronic obstructive pulmonary disease

## 2022-07-02 ENCOUNTER — Ambulatory Visit (INDEPENDENT_AMBULATORY_CARE_PROVIDER_SITE_OTHER): Payer: 59 | Admitting: Ophthalmology

## 2022-07-02 ENCOUNTER — Encounter (INDEPENDENT_AMBULATORY_CARE_PROVIDER_SITE_OTHER): Payer: Self-pay | Admitting: Ophthalmology

## 2022-07-02 DIAGNOSIS — Z961 Presence of intraocular lens: Secondary | ICD-10-CM

## 2022-07-02 DIAGNOSIS — H353221 Exudative age-related macular degeneration, left eye, with active choroidal neovascularization: Secondary | ICD-10-CM | POA: Diagnosis not present

## 2022-07-02 DIAGNOSIS — H33102 Unspecified retinoschisis, left eye: Secondary | ICD-10-CM | POA: Diagnosis not present

## 2022-07-02 DIAGNOSIS — H40053 Ocular hypertension, bilateral: Secondary | ICD-10-CM

## 2022-07-02 DIAGNOSIS — H35033 Hypertensive retinopathy, bilateral: Secondary | ICD-10-CM

## 2022-07-02 DIAGNOSIS — H353112 Nonexudative age-related macular degeneration, right eye, intermediate dry stage: Secondary | ICD-10-CM | POA: Diagnosis not present

## 2022-07-02 DIAGNOSIS — H318 Other specified disorders of choroid: Secondary | ICD-10-CM | POA: Diagnosis not present

## 2022-07-02 DIAGNOSIS — I10 Essential (primary) hypertension: Secondary | ICD-10-CM

## 2022-07-02 MED ORDER — AFLIBERCEPT 2MG/0.05ML IZ SOLN FOR KALEIDOSCOPE
2.0000 mg | INTRAVITREAL | Status: AC | PRN
  Administered 2022-07-02: 2 mg via INTRAVITREAL

## 2022-07-21 ENCOUNTER — Other Ambulatory Visit (INDEPENDENT_AMBULATORY_CARE_PROVIDER_SITE_OTHER): Payer: Self-pay | Admitting: Ophthalmology

## 2022-07-27 NOTE — Progress Notes (Signed)
[facility] - Clinic Note  08/02/2022     CHIEF COMPLAINT Patient presents for Retina Follow Up  HISTORY OF PRESENT ILLNESS: Kylie Wilson is a 72 y.o. female who presents to the clinic today for:   HPI     Retina Follow Up   Patient presents with  Wet AMD.  In both eyes.  This started 4 weeks ago.  Duration of 4 weeks.  Since onset it is stable.  I, the attending physician,  performed the HPI with the patient and updated documentation appropriately.        Comments   4 week retina follow up ARMD OS and I'VE os pt is reporting vision is blurred she has cataracts but is not ready for surgery according to Dr Kylie Wilson she would like notes from today's visit sent to him her last reading 133 last A1C 8.1      Last edited by Kylie Chris, MD on 08/02/2022  3:00 PM.    Pt feels like her vision is worse, she thinks it's from her cataracts   Referring physician: Carollee Massed, MD [facility] [address] [place], [place]  [address]  HISTORICAL INFORMATION:   Selected notes from the MEDICAL RECORD NUMBER Previous Kylie Wilson pt (2016) self-referred for possible RD LEE:  Ocular Hx- PCV diagnosed by Dr. Dorette Wilson at [facility]; followed w/ Dr. Rhina Wilson for DM PMH-    CURRENT MEDICATIONS: Current Outpatient Medications (Ophthalmic Drugs)  Medication Sig   Bromfenac Sodium 0.07 % SOLN INSTILL 1 DROP IN BOTH EYES FOUR TIMES DAILY   dorzolamide-timolol (COSOPT) 2-0.5 % ophthalmic solution Place 1 drop into both eyes 2 (two) times daily.   dorzolamide-timolol (COSOPT) 22.3-6.8 MG/ML ophthalmic solution Place 1 drop into both eyes 2 (two) times daily.   No current facility-administered medications for this visit. (Ophthalmic Drugs)   Current Outpatient Medications (Other)  Medication Sig   ACCU-CHEK AVIVA PLUS test strip USE TO TEST BLOOD SUGARS DAILY.   amlodipine-olmesartan (AZOR) 10-20 MG tablet Take 1 tablet by mouth daily.   aspirin  EC 81 MG tablet Take 1 tablet (81 mg total) by mouth daily. Swallow whole.   Blood Glucose Monitoring Suppl (GLUCOCOM BLOOD GLUCOSE MONITOR) DEVI One Touch Verio Meter. FSBS BID. E11.319   Cholecalciferol (VITAMIN D3 PO) Take 5,000 Units by mouth daily.   empagliflozin (JARDIANCE) 25 MG TABS tablet Take 1 tablet by mouth daily.   hydrochlorothiazide (HYDRODIURIL) 25 MG tablet TAKE 1 TABLET BY MOUTH EVERY DAY IN THE MORNING   Lancets (ONETOUCH DELICA PLUS LANCET33G) MISC Apply topically.   LINZESS 72 MCG capsule Take 72 mcg by mouth daily as needed (abdominal cramping).    metFORMIN (GLUCOPHAGE) 500 MG tablet Take 1,000 mg by mouth 2 (two) times daily with a meal.    metoprolol succinate (TOPROL-XL) 25 MG 24 hr tablet TAKE 1 TAB EVERY MORNING. HOLD IF SYSTOLIC BLOOD PRESSURE (TOP NUMBER) <100 MMHG OR PULSE <60 BPM   omeprazole (PRILOSEC) 40 MG capsule Take 40 mg by mouth every morning.   pregabalin (LYRICA) 75 MG capsule Take by mouth.   rosuvastatin (CRESTOR) 10 MG tablet Take 1 tablet (10 mg total) by mouth at bedtime for 90 doses.   No current facility-administered medications for this visit. (Other)   REVIEW OF SYSTEMS: ROS   Positive for: Gastrointestinal, Neurological, Musculoskeletal, Endocrine, Cardiovascular, Eyes, Respiratory Negative for: Constitutional, Skin, Genitourinary, HENT, Psychiatric, Allergic/Imm, Heme/Lymph Last edited by Kylie Wilson, COT on 08/02/2022  1:21 PM.      ALLERGIES Allergies  Allergen Reactions   Duloxetine Other (See Comments)    Pt says it also caused high pressure.   Lisinopril Other (See Comments)    cough   PAST MEDICAL HISTORY Past Medical History:  Diagnosis Date   Arthritis    Barrett's esophagus    Burning pain    Cervical radiculopathy    Cervical spondylosis    Coronary artery calcification    DDD (degenerative disc disease), cervical    Diabetes mellitus    type 2    GERD (gastroesophageal reflux disease)     Hyperlipidemia    Hypertension    Hypertensive retinopathy    OU   Macular degeneration    Dry OD; Wet OS   Obesity    Rectocele    Sleep apnea    does not tolerate cpap   Uterine prolapse    Past Surgical History:  Procedure Laterality Date   BREAST BIOPSY  1980   left   BREAST EXCISIONAL BIOPSY Left    Bening 1980's   CATARACT EXTRACTION Bilateral    Dr. Randon Wilson   EYE SURGERY Bilateral    Cat Sx - Dr. Randon Wilson   SCAR REVISION  2001   both legs   TOE SURGERY  1981   4th and 5th toe right foot   TOE SURGERY  2002   great toe left   TONSILLECTOMY     TUBAL LIGATION     FAMILY HISTORY Family History  Problem Relation Age of Onset   Heart disease Father    Pancreatic cancer Father    Breast cancer Paternal Aunt    Stroke Mother    Heart disease Brother    Colon cancer Neg Hx    Stomach cancer Neg Hx    Colon polyps Neg Hx    Rectal cancer Neg Hx    SOCIAL HISTORY Social History   Tobacco Use   Smoking status: Former    Packs/day: 0.80    Years: 30.00    Additional pack years: 0.00    Total pack years: 24.00    Types: Cigarettes    Quit date: 09/15/2011    Years since quitting: 10.8   Smokeless tobacco: Never   Tobacco comments:    smoked 20 years; then quit 10; smoked 30 years plus  Vaping Use   Vaping Use: Never used  Substance Use Topics   Alcohol use: Yes    Comment: Every evening, 3 glasses of wine    Drug use: No       OPHTHALMIC EXAM: Base Eye Exam     Visual Acuity (Snellen - Linear)       Right Left   Dist cc 20/25 -1 20/40 -2         Tonometry (Tonopen, 1:25 PM)       Right Left   Pressure 12 14         Pupils       Pupils Dark Light Shape React APD   Right PERRL 3 2 Round Brisk None   Left PERRL 3 2 Round Brisk None         Visual Fields       Left Right    Full Full         Extraocular Movement       Right Left    Full, Ortho Full, Ortho         Neuro/Psych     Oriented x3:  Yes   Mood/Affect: Normal          Dilation     Both eyes: 2.5% Phenylephrine @ 1:25 PM           Slit Lamp and Fundus Exam     Slit Lamp Exam       Right Left   Lids/Lashes Dermatochalasis - upper lid, Meibomian gland dysfunction Dermatochalasis - upper lid, Meibomian gland dysfunction   Conjunctiva/Sclera Melanosis, 1+ Injection Melanosis, 1+ Injection   Cornea 1+ fine Punctate epithelial erosions, arcus Trace Punctate epithelial erosions, arcus   Anterior Chamber Deep and quiet deep and clear, no cell or flare   Iris Round and moderately dilated, No NVI Round and moderately dilated to 5.29mm, No NVI   Lens PC IOL in good position with open PC PC IOL in good position, trace PCO   Anterior Vitreous Vitreous syneresis, scattered silicone oil bubbles Vitreous syneresis, silicone oil bubbles         Fundus Exam       Right Left   Disc Mild Pallor, +cupping, Thin inferior rim mild pallor, Sharp rim, Peripapillary atrophy, Thin inferior rim   C/D Ratio 0.7 0.6   Macula Flat, Blunted foveal reflex, Drusen, RPE mottling and clumping, mild ERM, No heme or edema blunted foveal reflex, RPE clumping and atrophy, persistent, peripapillary SRF -- improved, punctate MA / IRH, +ERM, pigmented atrophy/scarring IN macula   Vessels attenuated, Tortuous attenuated, mild tortuosity   Periphery Attached    Attached, bullous schisis cavity from 0200-0430 -- stable; no RT/RD           Refraction     Wearing Rx       Sphere Cylinder Axis Add   Right -0.75 +2.00 163 +1.75   Left +0.00 +1.25 169 +1.75           IMAGING AND PROCEDURES  Imaging and Procedures for 08/02/2022  OCT, Retina - OU - Both Eyes       Right Eye Quality was good. Central Foveal Thickness: 219. Progression has been stable. Findings include normal foveal contour, no IRF, no SRF, retinal drusen , subretinal hyper-reflective material, intraretinal hyper-reflective material, pigment epithelial detachment, outer retinal atrophy,  vitreomacular adhesion .   Left Eye Quality was good. Central Foveal Thickness: 280. Progression has improved. Findings include no IRF, abnormal foveal contour, retinal drusen , subretinal hyper-reflective material, choroidal neovascular membrane, pigment epithelial detachment, subretinal fluid, vitreous traction, outer retinal atrophy, vitreomacular adhesion (Interval improvement in SRF overlying peripapillary CNV; interval development of central VMT, peripheral schisis temporal periphery caught on widefield).   Notes *Images captured and stored on drive  Diagnosis / Impression:  OD: nonexudative ARMD OS: exudative ARMD / PCV -- Interval improvement in SRF overlying peripapillary CNV; interval development of central VMT, peripheral schisis temporal periphery caught on widefield  Clinical management:  See below  Abbreviations: NFP - Normal foveal profile. CME - cystoid macular edema. PED - pigment epithelial detachment. IRF - intraretinal fluid. SRF - subretinal fluid. EZ - ellipsoid zone. ERM - epiretinal membrane. ORA - outer retinal atrophy. ORT - outer retinal tubulation. SRHM - subretinal hyper-reflective material. IRHM - intraretinal hyper-reflective material      Intravitreal Injection, Pharmacologic Agent - OS - Left Eye       Time Out 08/02/2022. 2:47 PM. Confirmed correct patient, procedure, site, and patient consented.   Anesthesia Topical anesthesia was used. Anesthetic medications included Lidocaine 2%, Proparacaine 0.5%.   Procedure Preparation included 5% betadine to ocular surface,  eyelid speculum. A (32g) needle was used.   Injection: 2 mg aflibercept 2 MG/0.05ML   Route: Intravitreal, Site: Left Eye   NDC: L6038910, Lot: [id number], Expiration date: 07/13/2023, Waste: 0 mL   Post-op Post injection exam found visual acuity of at least counting fingers. The patient tolerated the procedure well. There were no complications. The patient received written and  verbal post procedure care education. Post injection medications were not given.            ASSESSMENT/PLAN:   ICD-10-CM   1. 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 Essential hypertension  I10     6. Hypertensive retinopathy of both eyes  H35.033     7. Pseudophakia, both eyes  Z96.1     8. Bilateral ocular hypertension  H40.053      1-3. Idiopathic polypoidal choroidal vasculopathy (PCV) OU  - lost to follow up for 6 mos from 07.18.23 to 01.26.24  - Exudative age related macular degeneration, OS (PCV)  - Nonexudative ARMD OD  - was seen in 2016 here by JDM  - transferred care to [facility] w/ Dr. Dorette Wilson -- received intravitreal injxns  - then transferred to Dr. Quinn Axe -- last visit 07/2019 -- IVE OD #7 for DME  - has seen Dr. Sherryll Burger twice  - FA (11.29.21) shows peripapillary CNV OS + peripheral leakage OU consistent w/ PCV  - ICG (02.28.22) shows polypoidal choroidal vasculopathy (PCV); Focal hyper cyanscence superior to disc -- cluster of polyps   - here, s/p IVA OS #1 (11.29.21), #2 (12.31.21), #3 (01.31.22), #4 (02.28.22), #5 (03.30.22), #6 (04.27.22), #7 (05.27.22), # 8 (06.24.22), #9 (07.25.22), #10 (08.29.22--JDM), #11 (09.26.22), #12 (10.24.22), #13 (11.28.22), #14 (01.23.23, #15 (03.20.23), [**10 moGAP in f/u**] #16 (01.26.24) #17 (02.23.24) #18(03.22.24) -- IVA resistance  - s/p IVE OS #1 (04.19.24)   - OCT shows exudative ARMD / PCV -- Interval improvement in SRF overlying peripapillary CNV; interval development of central VMT, peripheral schisis temporal periphery caught on  widefield  - recommend IVE OS #2 today, 05.20.24 w/ f/u in 4 wks  - pt wishes to proceed with injection  - RBA of procedure discussed, questions answered - see procedure note  - BCVA OD: 20/25 - stable, OS: 20/40 -- decreased - IVE informed consent obtained, signed and scanned, 04.19.24 (OS)  - f/u 4 weeks -- DFE/OCT, possible injection -- scan through schisis OS (0200-0430)  4. Retinoschisis OS  - focal, bullous schisis cavity in temporal periphery 2-430  - widefield OCT confirms schisis w/o RD  - stable  - continue monitoring for now  - may need laser if retinoschisis progresses posteriorly  5,6. Hypertensive retinopathy OU - discussed importance of tight BP control - monitor  7. Pseudophakia OU  - s/p CE/IOL OU (Dr. Randon Wilson)  - IOLs in good position, doing well - monitor  8. Ocular hypertension OU  - IOP 12,14  - pt is taking latanoprost and Cosopt  Ophthalmic Meds Ordered this visit:  Meds ordered this encounter  Medications   aflibercept (EYLEA) SOLN 2 mg  Return in about 4 weeks (around 08/30/2022) for ex ARMD OS, Dilated Exam, OCT, Possible Injxn.  There are no Patient Instructions on file for this visit.  This document serves as a record of services personally performed by Karie Chimera, MD, PhD. It was created on their behalf by De Blanch, an ophthalmic technician. The creation of this record is the provider's dictation and/or activities during the visit.    Electronically signed by: De Blanch, OA, 08/02/22  4:36 PM  This document serves as a record of services personally performed by Karie Chimera, MD, PhD. It was created on their behalf by Glee Arvin. Manson Passey, OA an ophthalmic technician. The creation of this record is the provider's dictation and/or activities during the visit.    Electronically signed by: Glee Arvin. Manson Passey, [place] 05.20.2024 4:36 PM  Karie Chimera, M.D., Ph.D. Diseases & Surgery of the Retina and Vitreous [facility] 08/02/2022   I have reviewed the above documentation for accuracy and completeness, and I agree with the above. Karie Chimera, M.D., Ph.D. 08/02/22 4:36 PM   Abbreviations: M myopia (nearsighted); A astigmatism; H hyperopia (farsighted); P presbyopia; Mrx spectacle prescription;  CTL contact lenses; OD right eye; OS left eye; OU both eyes  XT exotropia; ET esotropia; PEK punctate epithelial keratitis; PEE punctate epithelial erosions; DES dry eye syndrome; MGD meibomian gland dysfunction; ATs artificial tears; PFAT's preservative free artificial tears; NSC nuclear sclerotic cataract; PSC posterior subcapsular cataract; ERM epi-retinal membrane; PVD posterior vitreous detachment; RD retinal detachment; DM diabetes mellitus; DR diabetic retinopathy; NPDR non-proliferative diabetic retinopathy; PDR proliferative diabetic retinopathy; CSME clinically significant macular edema; DME diabetic macular edema; dbh dot blot hemorrhages; CWS cotton wool spot; POAG primary open angle glaucoma; C/D cup-to-disc ratio; HVF humphrey visual field; GVF goldmann visual field; OCT optical coherence tomography; IOP intraocular pressure; BRVO Branch retinal vein occlusion; CRVO central retinal vein occlusion; CRAO central retinal artery occlusion; BRAO branch retinal artery occlusion; RT retinal tear; SB scleral buckle; PPV pars plana vitrectomy; VH Vitreous hemorrhage; PRP panretinal laser photocoagulation; IVK intravitreal kenalog; VMT vitreomacular traction; MH Macular hole;  NVD neovascularization of the disc; NVE neovascularization elsewhere; AREDS age related eye disease study; ARMD age related macular degeneration; POAG primary open angle glaucoma; EBMD epithelial/anterior basement membrane dystrophy; ACIOL anterior chamber intraocular lens; IOL intraocular lens; PCIOL posterior chamber intraocular lens; Phaco/IOL phacoemulsification with intraocular lens placement; PRK photorefractive keratectomy; LASIK laser  assisted in situ keratomileusis; HTN hypertension; DM diabetes mellitus; COPD chronic obstructive pulmonary disease

## 2022-08-02 ENCOUNTER — Encounter (INDEPENDENT_AMBULATORY_CARE_PROVIDER_SITE_OTHER): Payer: Self-pay | Admitting: Ophthalmology

## 2022-08-02 ENCOUNTER — Ambulatory Visit (INDEPENDENT_AMBULATORY_CARE_PROVIDER_SITE_OTHER): Payer: 59 | Admitting: Ophthalmology

## 2022-08-02 DIAGNOSIS — H33102 Unspecified retinoschisis, left eye: Secondary | ICD-10-CM

## 2022-08-02 DIAGNOSIS — H40053 Ocular hypertension, bilateral: Secondary | ICD-10-CM

## 2022-08-02 DIAGNOSIS — H318 Other specified disorders of choroid: Secondary | ICD-10-CM

## 2022-08-02 DIAGNOSIS — H353221 Exudative age-related macular degeneration, left eye, with active choroidal neovascularization: Secondary | ICD-10-CM

## 2022-08-02 DIAGNOSIS — H353112 Nonexudative age-related macular degeneration, right eye, intermediate dry stage: Secondary | ICD-10-CM | POA: Diagnosis not present

## 2022-08-02 DIAGNOSIS — Z961 Presence of intraocular lens: Secondary | ICD-10-CM

## 2022-08-02 DIAGNOSIS — H35033 Hypertensive retinopathy, bilateral: Secondary | ICD-10-CM

## 2022-08-02 DIAGNOSIS — I10 Essential (primary) hypertension: Secondary | ICD-10-CM

## 2022-08-02 MED ORDER — AFLIBERCEPT 2MG/0.05ML IZ SOLN FOR KALEIDOSCOPE
2.0000 mg | INTRAVITREAL | Status: AC | PRN
Start: 2022-08-02 — End: 2022-08-02
  Administered 2022-08-02: 2 mg via INTRAVITREAL

## 2022-08-10 ENCOUNTER — Ambulatory Visit: Payer: Medicare Other | Admitting: Cardiology

## 2022-08-23 NOTE — Progress Notes (Signed)
[facility] - Clinic Note  08/30/2022     CHIEF COMPLAINT Patient presents for Retina Follow Up  HISTORY OF PRESENT ILLNESS: Kylie Wilson is a 72 y.o. female who presents to the clinic today for:   HPI     Retina Follow Up   Patient presents with  Wet AMD.  In both eyes.  This started months ago.  Duration of 4 weeks.  Since onset it is stable.  I, the attending physician,  performed the HPI with the patient and updated documentation appropriately.        Comments   Patient states that the vision is the same. She is using Cosopt OU BID and Latanoprost OU at bedtime. Her blood sugar was 165.      Last edited by Rennis Chris, MD on 08/30/2022  5:55 PM.     Pt states she can barely see out of her left eye, she states if both eyes were the same, she wouldn't be able to drive  Referring physician: Carollee Massed, MD [facility] [address] [place], [place]  [address]  HISTORICAL INFORMATION:   Selected notes from the MEDICAL RECORD NUMBER Previous [name] pt (2016) self-referred for possible RD LEE:  Ocular Hx- PCV diagnosed by Dr. Dorette Grate at [facility]; followed w/ Dr. Rhina Brackett for DM PMH-    CURRENT MEDICATIONS: Current Outpatient Medications (Ophthalmic Drugs)  Medication Sig   Bromfenac Sodium 0.07 % SOLN INSTILL 1 DROP IN BOTH EYES FOUR TIMES DAILY   dorzolamide-timolol (COSOPT) 2-0.5 % ophthalmic solution Place 1 drop into both eyes 2 (two) times daily.   dorzolamide-timolol (COSOPT) 22.3-6.8 MG/ML ophthalmic solution Place 1 drop into both eyes 2 (two) times daily.   No current facility-administered medications for this visit. (Ophthalmic Drugs)   Current Outpatient Medications (Other)  Medication Sig   ACCU-CHEK AVIVA PLUS test strip USE TO TEST BLOOD SUGARS DAILY.   amlodipine-olmesartan (AZOR) 10-20 MG tablet Take 1 tablet by mouth daily.   aspirin EC 81 MG tablet Take 1 tablet (81 mg total) by mouth daily.  Swallow whole.   Blood Glucose Monitoring Suppl (GLUCOCOM BLOOD GLUCOSE MONITOR) DEVI One Touch Verio Meter. FSBS BID. E11.319   Cholecalciferol (VITAMIN D3 PO) Take 5,000 Units by mouth daily.   empagliflozin (JARDIANCE) 25 MG TABS tablet Take 1 tablet by mouth daily.   hydrochlorothiazide (HYDRODIURIL) 25 MG tablet TAKE 1 TABLET BY MOUTH EVERY DAY IN THE MORNING   Lancets (ONETOUCH DELICA PLUS LANCET33G) MISC Apply topically.   LINZESS 72 MCG capsule Take 72 mcg by mouth daily as needed (abdominal cramping).    metFORMIN (GLUCOPHAGE) 500 MG tablet Take 1,000 mg by mouth 2 (two) times daily with a meal.    metoprolol succinate (TOPROL-XL) 25 MG 24 hr tablet TAKE 1 TAB EVERY MORNING. HOLD IF SYSTOLIC BLOOD PRESSURE (TOP NUMBER) <100 MMHG OR PULSE <60 BPM   omeprazole (PRILOSEC) 40 MG capsule Take 40 mg by mouth every morning.   pregabalin (LYRICA) 75 MG capsule Take by mouth.   rosuvastatin (CRESTOR) 10 MG tablet Take 1 tablet (10 mg total) by mouth at bedtime for 90 doses.   No current facility-administered medications for this visit. (Other)   REVIEW OF SYSTEMS: ROS   Positive for: Gastrointestinal, Neurological, Musculoskeletal, Endocrine, Cardiovascular, Eyes, Respiratory Negative for: Constitutional, Skin, Genitourinary, HENT, Psychiatric, Allergic/Imm, Heme/Lymph Last edited by Charlette Caffey, COT on 08/30/2022  1:33 PM.       ALLERGIES Allergies  Allergen Reactions   Duloxetine Other (See Comments)    Pt says it also caused high pressure.   Lisinopril Other (See Comments)    cough   PAST MEDICAL HISTORY Past Medical History:  Diagnosis Date   Arthritis    Barrett's esophagus    Burning pain    Cervical radiculopathy    Cervical spondylosis    Coronary artery calcification    DDD (degenerative disc disease), cervical    Diabetes mellitus    type 2    GERD (gastroesophageal reflux disease)    Hyperlipidemia    Hypertension    Hypertensive retinopathy    OU    Macular degeneration    Dry OD; Wet OS   Obesity    Rectocele    Sleep apnea    does not tolerate cpap   Uterine prolapse    Past Surgical History:  Procedure Laterality Date   BREAST BIOPSY  1980   left   BREAST EXCISIONAL BIOPSY Left    Bening 1980's   CATARACT EXTRACTION Bilateral    Dr. Randon Goldsmith   EYE SURGERY Bilateral    Cat Sx - Dr. Randon Goldsmith   SCAR REVISION  2001   both legs   TOE SURGERY  1981   4th and 5th toe right foot   TOE SURGERY  2002   great toe left   TONSILLECTOMY     TUBAL LIGATION     FAMILY HISTORY Family History  Problem Relation Age of Onset   Heart disease Father    Pancreatic cancer Father    Breast cancer Paternal Aunt    Stroke Mother    Heart disease Brother    Colon cancer Neg Hx    Stomach cancer Neg Hx    Colon polyps Neg Hx    Rectal cancer Neg Hx    SOCIAL HISTORY Social History   Tobacco Use   Smoking status: Former    Packs/day: 0.80    Years: 30.00    Additional pack years: 0.00    Total pack years: 24.00    Types: Cigarettes    Quit date: 09/15/2011    Years since quitting: 10.9   Smokeless tobacco: Never   Tobacco comments:    smoked 20 years; then quit 10; smoked 30 years plus  Vaping Use   Vaping Use: Never used  Substance Use Topics   Alcohol use: Yes    Comment: Every evening, 3 glasses of wine    Drug use: No       OPHTHALMIC EXAM: Base Eye Exam     Visual Acuity (Snellen - Linear)       Right Left   Dist cc 20/25 20/100   Dist ph cc  20/80    Correction: Glasses         Tonometry (Tonopen, 1:41 PM)       Right Left   Pressure 16 26  OS- 1 drop of Cosopt @ 1:42, Brimonidine @ 1:45, Lumigan @ 1:47        Pupils       Dark Light Shape React APD   Right 3 2 Round Brisk None   Left 3 2 Round Brisk None         Visual Fields       Left Right    Full Full         Extraocular Movement       Right Left    Full, Ortho Full, Ortho  Neuro/Psych     Oriented x3: Yes    Mood/Affect: Normal         Dilation     Both eyes: 1.0% Mydriacyl, 2.5% Phenylephrine @ 1:33 PM           Slit Lamp and Fundus Exam     Slit Lamp Exam       Right Left   Lids/Lashes Dermatochalasis - upper lid, Meibomian gland dysfunction Dermatochalasis - upper lid, Meibomian gland dysfunction   Conjunctiva/Sclera Melanosis, 1+ Injection Melanosis, 1+ Injection   Cornea 1+ fine Punctate epithelial erosions, arcus Trace Punctate epithelial erosions, arcus   Anterior Chamber Deep and quiet deep and clear, no cell or flare   Iris Round and moderately dilated, No NVI Round and moderately dilated to 5.28mm, No NVI   Lens PC IOL in good position with open PC PC IOL in good position, 1-2+ PCO   Anterior Vitreous Vitreous syneresis, scattered silicone oil bubbles Vitreous syneresis, silicone oil bubbles         Fundus Exam       Right Left   Disc Mild Pallor, +cupping, Thin inferior rim mild pallor, Sharp rim, Peripapillary atrophy, Thin inferior rim   C/D Ratio 0.7 0.6   Macula Flat, Blunted foveal reflex, Drusen, RPE mottling and clumping, mild ERM, No heme or edema blunted foveal reflex, RPE clumping and atrophy, persistent, peripapillary SRF -- improved, punctate MA / IRH, +ERM, pigmented atrophy/scarring IN macula, central cystic changes -- ?more VMT related   Vessels attenuated, Tortuous attenuated, mild tortuosity   Periphery Attached    Attached, bullous schisis cavity from 0200-0430 -- stable; no RT/RD           Refraction     Wearing Rx       Sphere Cylinder Axis Add   Right -0.75 +2.00 163 +1.75   Left +0.00 +1.25 169 +1.75         Manifest Refraction       Sphere Cylinder Axis Dist VA   Right       Left -1.75 +1.00 169 20/40+1           IMAGING AND PROCEDURES  Imaging and Procedures for 08/30/2022  OCT, Retina - OU - Both Eyes       Right Eye Quality was good. Central Foveal Thickness: 218. Progression has been stable. Findings include  normal foveal contour, no IRF, no SRF, retinal drusen , subretinal hyper-reflective material, intraretinal hyper-reflective material, pigment epithelial detachment, outer retinal atrophy, vitreomacular adhesion .   Left Eye Quality was good. Central Foveal Thickness: 282. Progression has improved. Findings include abnormal foveal contour, retinal drusen , subretinal hyper-reflective material, choroidal neovascular membrane, intraretinal fluid, pigment epithelial detachment, subretinal fluid, vitreous traction, outer retinal atrophy, vitreomacular adhesion (Interval improvement in SRF overlying peripapillary CNV; persistent central VMT with central cystic changes, peripheral schisis temporal periphery caught on widefield).   Notes *Images captured and stored on drive  Diagnosis / Impression:  OD: nonexudative ARMD OS: exudative ARMD / PCV -- Interval improvement in SRF overlying peripapillary CNV; persistent central VMT with central cystic changes, peripheral schisis temporal periphery caught on widefield -- not imaged today  Clinical management:  See below  Abbreviations: NFP - Normal foveal profile. CME - cystoid macular edema. PED - pigment epithelial detachment. IRF - intraretinal fluid. SRF - subretinal fluid. EZ - ellipsoid zone. ERM - epiretinal membrane. ORA - outer retinal atrophy. ORT - outer retinal tubulation. SRHM - subretinal hyper-reflective material. IRHM - intraretinal  hyper-reflective material      Intravitreal Injection, Pharmacologic Agent - OS - Left Eye       Time Out 08/30/2022. 2:25 PM. Confirmed correct patient, procedure, site, and patient consented.   Anesthesia Topical anesthesia was used. Anesthetic medications included Lidocaine 2%, Proparacaine 0.5%.   Procedure Preparation included 5% betadine to ocular surface, eyelid speculum. A (32g) needle was used.   Injection: 2 mg aflibercept 2 MG/0.05ML   Route: Intravitreal, Site: Left Eye   NDC: [id number],  Lot: [id number], Expiration date: 08/13/2023, Waste: 0 mL   Post-op Post injection exam found visual acuity of at least counting fingers. The patient tolerated the procedure well. There were no complications. The patient received written and verbal post procedure care education. Post injection medications were not given.            ASSESSMENT/PLAN:   ICD-10-CM   1. 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 Essential hypertension  I10     6. Hypertensive retinopathy of both eyes  H35.033     7. Pseudophakia, both eyes  Z96.1     8. Bilateral ocular hypertension  H40.053      1-3. Idiopathic polypoidal choroidal vasculopathy (PCV) OU  - lost to follow up for 6 mos from 07.18.23 to 01.26.24  - Exudative age related macular degeneration, OS (PCV)  - Nonexudative ARMD OD  - was seen in 2016 here by JDM  - transferred care to [facility] w/ Dr. Dorette Grate -- received intravitreal injxns  - then transferred to Dr. Quinn Axe -- last visit 07/2019 -- IVE OD #7 for DME  - has seen Dr. Sherryll Burger twice  - FA (11.29.21) shows peripapillary CNV OS + peripheral leakage OU consistent w/ PCV  - ICG (02.28.22) shows polypoidal choroidal vasculopathy (PCV); Focal hyper cyanscence superior to disc -- cluster of polyps   - here, s/p IVA OS #1 (11.29.21), #2 (12.31.21), #3 (01.31.22), #4 (02.28.22), #5 (03.30.22), #6 (04.27.22), #7 (05.27.22), # 8 (06.24.22), #9 (07.25.22), #10 (08.29.22--JDM), #11 (09.26.22), #12 (10.24.22), #13 (11.28.22), #14 (01.23.23, #15 (03.20.23), [**10 moGAP in f/u**] #16 (01.26.24) #17 (02.23.24)  #18(03.22.24) -- IVA resistance  - s/p IVE OS #1 (04.19.24), #2 (05.20.24)  - OCT shows exudative ARMD / PCV -- Interval improvement in SRF overlying peripapillary CNV; persistent central VMT with central cystic changes, peripheral schisis temporal periphery caught on widefield at 4 weeks  - recommend IVE OS #3 today, 06.17.24 w/ f/u in 4 wks  - pt wishes to proceed with injection  - RBA of procedure discussed, questions answered - see procedure note  - BCVA OD: 20/25 - stable, OS: 20/80 -- decreased - IVE informed consent obtained, signed and scanned, 04.19.24 (OS)  - f/u 4 weeks -- DFE/OCT, possible injection -- scan through schisis OS (0200-0430)  4. Retinoschisis OS  - focal, bullous schisis cavity in temporal periphery 2-430  - widefield OCT confirms schisis w/o RD  - stable  - continue monitoring for now  - may need laser if retinoschisis progresses posteriorly  5,6. Hypertensive retinopathy OU - discussed importance of tight  BP control - monitor  7. Pseudophakia OU  - s/p CE/IOL OU (Dr. Randon Goldsmith)  - IOLs in good position, doing well - monitor  8. Ocular hypertension OU  - IOP 16,26  - pt is taking latanoprost and Cosopt  9. PCO OS  - recommend Yag Cap OS  - will bring pt back next Thursday, June 27th  Ophthalmic Meds Ordered this visit:  Meds ordered this encounter  Medications   aflibercept (EYLEA) SOLN 2 mg     Return in about 10 days (around 09/09/2022) for f/u PCO OS, YAG OS.  There are no Patient Instructions on file for this visit.  This document serves as a record of services personally performed by Karie Chimera, MD, PhD. It was created on their behalf by De Blanch, an ophthalmic technician. The creation of this record is the provider's dictation and/or activities during the visit.    Electronically signed by: De Blanch, OA, 08/30/22  8:48 PM  This document serves as a record of services personally performed by Karie Chimera, MD, PhD. It  was created on their behalf by Glee Arvin. Manson Passey, OA an ophthalmic technician. The creation of this record is the provider's dictation and/or activities during the visit.    Electronically signed by: Glee Arvin. Kristopher Oppenheim 06.17.2024 8:48 PM   Karie Chimera, M.D., Ph.D. Diseases & Surgery of the Retina and Vitreous [facility] 08/30/2022   I have reviewed the above documentation for accuracy and completeness, and I agree with the above. Karie Chimera, M.D., Ph.D. 08/30/22 8:58 PM  Abbreviations: M myopia (nearsighted); A astigmatism; H hyperopia (farsighted); P presbyopia; Mrx spectacle prescription;  CTL contact lenses; OD right eye; OS left eye; OU both eyes  XT exotropia; ET esotropia; PEK punctate epithelial keratitis; PEE punctate epithelial erosions; DES dry eye syndrome; MGD meibomian gland dysfunction; ATs artificial tears; PFAT's preservative free artificial tears; NSC nuclear sclerotic cataract; PSC posterior subcapsular cataract; ERM epi-retinal membrane; PVD posterior vitreous detachment; RD retinal detachment; DM diabetes mellitus; DR diabetic retinopathy; NPDR non-proliferative diabetic retinopathy; PDR proliferative diabetic retinopathy; CSME clinically significant macular edema; DME diabetic macular edema; dbh dot blot hemorrhages; CWS cotton wool spot; POAG primary open angle glaucoma; C/D cup-to-disc ratio; HVF humphrey visual field; GVF goldmann visual field; OCT optical coherence tomography; IOP intraocular pressure; BRVO Branch retinal vein occlusion; CRVO central retinal vein occlusion; CRAO central retinal artery occlusion; BRAO branch retinal artery occlusion; RT retinal tear; SB scleral buckle; PPV pars plana vitrectomy; VH Vitreous hemorrhage; PRP panretinal laser photocoagulation; IVK intravitreal kenalog; VMT vitreomacular traction; MH Macular hole;  NVD neovascularization of the disc; NVE neovascularization elsewhere; AREDS age related eye disease  study; ARMD age related macular degeneration; POAG primary open angle glaucoma; EBMD epithelial/anterior basement membrane dystrophy; ACIOL anterior chamber intraocular lens; IOL intraocular lens; PCIOL posterior chamber intraocular lens; Phaco/IOL phacoemulsification with intraocular lens placement; PRK photorefractive keratectomy; LASIK laser assisted in situ keratomileusis; HTN hypertension; DM diabetes mellitus; COPD chronic obstructive pulmonary disease

## 2022-08-30 ENCOUNTER — Ambulatory Visit (INDEPENDENT_AMBULATORY_CARE_PROVIDER_SITE_OTHER): Payer: 59 | Admitting: Ophthalmology

## 2022-08-30 ENCOUNTER — Encounter (INDEPENDENT_AMBULATORY_CARE_PROVIDER_SITE_OTHER): Payer: Self-pay | Admitting: Ophthalmology

## 2022-08-30 DIAGNOSIS — H33102 Unspecified retinoschisis, left eye: Secondary | ICD-10-CM

## 2022-08-30 DIAGNOSIS — Z961 Presence of intraocular lens: Secondary | ICD-10-CM

## 2022-08-30 DIAGNOSIS — H318 Other specified disorders of choroid: Secondary | ICD-10-CM | POA: Diagnosis not present

## 2022-08-30 DIAGNOSIS — H40053 Ocular hypertension, bilateral: Secondary | ICD-10-CM

## 2022-08-30 DIAGNOSIS — H353112 Nonexudative age-related macular degeneration, right eye, intermediate dry stage: Secondary | ICD-10-CM

## 2022-08-30 DIAGNOSIS — H35033 Hypertensive retinopathy, bilateral: Secondary | ICD-10-CM

## 2022-08-30 DIAGNOSIS — H353221 Exudative age-related macular degeneration, left eye, with active choroidal neovascularization: Secondary | ICD-10-CM

## 2022-08-30 DIAGNOSIS — I10 Essential (primary) hypertension: Secondary | ICD-10-CM

## 2022-08-30 MED ORDER — AFLIBERCEPT 2MG/0.05ML IZ SOLN FOR KALEIDOSCOPE
2.0000 mg | INTRAVITREAL | Status: AC | PRN
Start: 2022-08-30 — End: 2022-08-30
  Administered 2022-08-30: 2 mg via INTRAVITREAL

## 2022-09-09 ENCOUNTER — Encounter (INDEPENDENT_AMBULATORY_CARE_PROVIDER_SITE_OTHER): Payer: 59 | Admitting: Ophthalmology

## 2022-09-09 DIAGNOSIS — H318 Other specified disorders of choroid: Secondary | ICD-10-CM

## 2022-09-09 DIAGNOSIS — H35033 Hypertensive retinopathy, bilateral: Secondary | ICD-10-CM

## 2022-09-09 DIAGNOSIS — I10 Essential (primary) hypertension: Secondary | ICD-10-CM

## 2022-09-09 DIAGNOSIS — H353112 Nonexudative age-related macular degeneration, right eye, intermediate dry stage: Secondary | ICD-10-CM

## 2022-09-09 DIAGNOSIS — H33102 Unspecified retinoschisis, left eye: Secondary | ICD-10-CM

## 2022-09-09 DIAGNOSIS — H40053 Ocular hypertension, bilateral: Secondary | ICD-10-CM

## 2022-09-09 DIAGNOSIS — Z961 Presence of intraocular lens: Secondary | ICD-10-CM

## 2022-09-09 DIAGNOSIS — H353221 Exudative age-related macular degeneration, left eye, with active choroidal neovascularization: Secondary | ICD-10-CM

## 2022-09-21 NOTE — Progress Notes (Signed)
[facility] - Clinic Note  09/27/2022     CHIEF COMPLAINT Patient presents for Retina Follow Up  HISTORY OF PRESENT ILLNESS: Kylie Wilson is a 72 y.o. female who presents to the clinic today for:   HPI     Retina Follow Up   Patient presents with  Wet AMD.  In both eyes.  This started months ago.  Duration of 4 weeks.  Since onset it is stable.  I, the attending physician,  performed the HPI with the patient and updated documentation appropriately.        Comments   Patient feels that the vision is the same. She seeing floaters and a slight pain to the left eye.  She is using Cosopt OU BID and Timolol OU BID. Her blood sugar was 162.       Last edited by Kylie Chris, MD on 09/27/2022  8:32 PM.    Pt states her vision is a "tad" better, she states she decided not to do the Yag bc she isnt sure if it will help her vision or not   Referring physician: Carollee Massed, MD [facility] [address] [place], [place]  [address]  HISTORICAL INFORMATION:   Selected notes from the MEDICAL RECORD NUMBER Previous Kylie Wilson pt (2016) self-referred for possible RD LEE:  Ocular Hx- PCV diagnosed by Dr. Dorette Grate at [facility]; followed w/ Dr. Rhina Brackett for DM PMH-    CURRENT MEDICATIONS: Current Outpatient Medications (Ophthalmic Drugs)  Medication Sig   Bromfenac Sodium 0.07 % SOLN INSTILL 1 DROP IN BOTH EYES FOUR TIMES DAILY   dorzolamide-timolol (COSOPT) 2-0.5 % ophthalmic solution Place 1 drop into both eyes 2 (two) times daily.   dorzolamide-timolol (COSOPT) 22.3-6.8 MG/ML ophthalmic solution Place 1 drop into both eyes 2 (two) times daily.   No current facility-administered medications for this visit. (Ophthalmic Drugs)   Current Outpatient Medications (Other)  Medication Sig   ACCU-CHEK AVIVA PLUS test strip USE TO TEST BLOOD SUGARS DAILY.   amlodipine-olmesartan (AZOR) 10-20 MG tablet Take 1 tablet by mouth daily.   aspirin  EC 81 MG tablet Take 1 tablet (81 mg total) by mouth daily. Swallow whole.   Blood Glucose Monitoring Suppl (GLUCOCOM BLOOD GLUCOSE MONITOR) DEVI One Touch Verio Meter. FSBS BID. E11.319   Cholecalciferol (VITAMIN D3 PO) Take 5,000 Units by mouth daily.   empagliflozin (JARDIANCE) 25 MG TABS tablet Take 1 tablet by mouth daily.   hydrochlorothiazide (HYDRODIURIL) 25 MG tablet TAKE 1 TABLET BY MOUTH EVERY MORNING.   Lancets (ONETOUCH DELICA PLUS LANCET33G) MISC Apply topically.   LINZESS 72 MCG capsule Take 72 mcg by mouth daily as needed (abdominal cramping).    metFORMIN (GLUCOPHAGE) 500 MG tablet Take 1,000 mg by mouth 2 (two) times daily with a meal.    metoprolol succinate (TOPROL-XL) 25 MG 24 hr tablet TAKE 1 TAB EVERY MORNING. HOLD IF SYSTOLIC BLOOD PRESSURE (TOP NUMBER) <100 MMHG OR PULSE <60 BPM   omeprazole (PRILOSEC) 40 MG capsule Take 40 mg by mouth every morning.   pregabalin (LYRICA) 75 MG capsule Take by mouth.   rosuvastatin (CRESTOR) 10 MG tablet Take 1 tablet (10 mg total) by mouth at bedtime for 90 doses.   No current facility-administered medications for this visit. (Other)   REVIEW OF SYSTEMS: ROS   Positive for: Gastrointestinal, Neurological, Musculoskeletal, Endocrine, Cardiovascular, Eyes, Respiratory Negative for: Constitutional, Skin, Genitourinary, HENT, Psychiatric, Allergic/Imm, Heme/Lymph Last edited by Kylie Wilson, COT on  09/27/2022  1:13 PM.     ALLERGIES Allergies  Allergen Reactions   Duloxetine Other (See Comments)    Pt says it also caused high pressure.   Lisinopril Other (See Comments)    cough   PAST MEDICAL HISTORY Past Medical History:  Diagnosis Date   Arthritis    Barrett's esophagus    Burning pain    Cervical radiculopathy    Cervical spondylosis    Coronary artery calcification    DDD (degenerative disc disease), cervical    Diabetes mellitus    type 2    GERD (gastroesophageal reflux disease)    Hyperlipidemia     Hypertension    Hypertensive retinopathy    OU   Macular degeneration    Dry OD; Wet OS   Obesity    Rectocele    Sleep apnea    does not tolerate cpap   Uterine prolapse    Past Surgical History:  Procedure Laterality Date   BREAST BIOPSY  1980   left   BREAST EXCISIONAL BIOPSY Left    Bening 1980's   CATARACT EXTRACTION Bilateral    Dr. Randon Wilson   EYE SURGERY Bilateral    Cat Sx - Dr. Randon Wilson   SCAR REVISION  2001   both legs   TOE SURGERY  1981   4th and 5th toe right foot   TOE SURGERY  2002   great toe left   TONSILLECTOMY     TUBAL LIGATION     FAMILY HISTORY Family History  Problem Relation Age of Onset   Heart disease Father    Pancreatic cancer Father    Breast cancer Paternal Aunt    Stroke Mother    Heart disease Brother    Colon cancer Neg Hx    Stomach cancer Neg Hx    Colon polyps Neg Hx    Rectal cancer Neg Hx    SOCIAL HISTORY Social History   Tobacco Use   Smoking status: Former    Current packs/day: 0.00    Average packs/day: 0.8 packs/day for 30.0 years (24.0 ttl pk-yrs)    Types: Cigarettes    Start date: 09/14/1981    Quit date: 09/15/2011    Years since quitting: 11.0   Smokeless tobacco: Never   Tobacco comments:    smoked 20 years; then quit 10; smoked 30 years plus  Vaping Use   Vaping status: Never Used  Substance Use Topics   Alcohol use: Yes    Comment: Every evening, 3 glasses of wine    Drug use: No       OPHTHALMIC EXAM: Base Eye Exam     Visual Acuity (Snellen - Linear)       Right Left   Dist cc 20/25 20/80   Dist ph cc  20/50    Correction: Glasses         Tonometry (Tonopen, 1:17 PM)       Right Left   Pressure 17 20         Pupils       Dark Light Shape React APD   Right 3 2 Round Brisk None   Left 3 2 Round Brisk None         Visual Fields       Left Right    Full Full         Extraocular Movement       Right Left    Full, Ortho Full, Ortho  Neuro/Psych     Oriented  x3: Yes   Mood/Affect: Normal         Dilation     Both eyes: 1.0% Mydriacyl, 2.5% Phenylephrine @ 1:14 PM           Slit Lamp and Fundus Exam     Slit Lamp Exam       Right Left   Lids/Lashes Dermatochalasis - upper lid, Meibomian gland dysfunction Dermatochalasis - upper lid, Meibomian gland dysfunction   Conjunctiva/Sclera Melanosis, 1+ Injection Melanosis, 1+ Injection   Cornea 1+ fine Punctate epithelial erosions, arcus Trace Punctate epithelial erosions, arcus   Anterior Chamber Deep and quiet deep and clear, no cell or flare   Iris Round and moderately dilated, No NVI Round and moderately dilated to 5.41mm, No NVI   Lens PC IOL in good position with open PC PC IOL in good position, 1-2+ PCO   Anterior Vitreous Vitreous syneresis, scattered silicone oil bubbles Vitreous syneresis, silicone oil bubbles         Fundus Exam       Right Left   Disc Mild Pallor, +cupping, Thin inferior rim mild pallor, Sharp rim, Peripapillary atrophy, Thin inferior rim   C/D Ratio 0.7 0.6   Macula Flat, Blunted foveal reflex, Drusen, RPE mottling and clumping, mild ERM, No heme or edema blunted foveal reflex, RPE clumping and atrophy, persistent, peripapillary SRF -- stably improved, punctate MA / IRH -- improved, +ERM, pigmented atrophy/scarring IN macula, central cystic changes -- more VMT related   Vessels attenuated, Tortuous attenuated, mild tortuosity   Periphery Attached    Attached, bullous schisis cavity from 0200-0430 -- stable; no RT/RD           Refraction     Wearing Rx       Sphere Cylinder Axis Add   Right -0.75 +2.00 163 +1.75   Left +0.00 +1.25 169 +1.75           IMAGING AND PROCEDURES  Imaging and Procedures for 09/27/2022  OCT, Retina - OU - Both Eyes       Right Eye Quality was good. Central Foveal Thickness: 215. Progression has been stable. Findings include normal foveal contour, no IRF, no SRF, retinal drusen , subretinal hyper-reflective  material, intraretinal hyper-reflective material, pigment epithelial detachment, outer retinal atrophy, vitreomacular adhesion .   Left Eye Quality was good. Central Foveal Thickness: 289. Progression has been stable. Findings include no SRF, abnormal foveal contour, retinal drusen , subretinal hyper-reflective material, choroidal neovascular membrane, intraretinal fluid, pigment epithelial detachment, vitreous traction, outer retinal atrophy, vitreomacular adhesion (stable improvement in SRF overlying peripapillary CNV; persistent central VMT with central cystic changes, peripheral schisis temporal periphery caught on widefield -- not imaged today).   Notes *Images captured and stored on drive  Diagnosis / Impression:  OD: nonexudative ARMD OS: exudative ARMD / PCV -- stable improvement in SRF overlying peripapillary CNV; persistent central VMT with central cystic changes, peripheral schisis temporal periphery caught on widefield -- not imaged today  Clinical management:  See below  Abbreviations: NFP - Normal foveal profile. CME - cystoid macular edema. PED - pigment epithelial detachment. IRF - intraretinal fluid. SRF - subretinal fluid. EZ - ellipsoid zone. ERM - epiretinal membrane. ORA - outer retinal atrophy. ORT - outer retinal tubulation. SRHM - subretinal hyper-reflective material. IRHM - intraretinal hyper-reflective material      Intravitreal Injection, Pharmacologic Agent - OS - Left Eye       Time Out 09/27/2022. 1:53 PM.  Confirmed correct patient, procedure, site, and patient consented.   Anesthesia Topical anesthesia was used. Anesthetic medications included Lidocaine 2%, Proparacaine 0.5%.   Procedure Preparation included 5% betadine to ocular surface, eyelid speculum. A (32g) needle was used.   Injection: 2 mg aflibercept 2 MG/0.05ML   Route: Intravitreal, Site: Left Eye   NDC: L6038910, Lot: [id number], Expiration date: 09/12/2023, Waste: 0 mL   Post-op Post  injection exam found visual acuity of at least counting fingers. The patient tolerated the procedure well. There were no complications. The patient received written and verbal post procedure care education. Post injection medications were not given.            ASSESSMENT/PLAN:   ICD-10-CM   1. 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 Essential hypertension  I10     6. Hypertensive retinopathy of both eyes  H35.033     7. Pseudophakia, both eyes  Z96.1     8. Bilateral ocular hypertension  H40.053      1-3. Idiopathic polypoidal choroidal vasculopathy (PCV) OU  - lost to follow up for 6 mos from 07.18.23 to 01.26.24  - Exudative age related macular degeneration, OS (PCV)  - Nonexudative ARMD OD  - was seen in 2016 here by JDM  - transferred care to [facility] w/ Dr. Dorette Grate -- received intravitreal injxns  - then transferred to Dr. Quinn Axe -- last visit 07/2019 -- IVE OD #7 for DME  - has seen Dr. Sherryll Burger twice  - FA (11.29.21) shows peripapillary CNV OS + peripheral leakage OU consistent w/ PCV  - ICG (02.28.22) shows polypoidal choroidal vasculopathy (PCV); Focal hyper cyanscence superior to disc -- cluster of polyps   - here, s/p IVA OS #1 (11.29.21), #2 (12.31.21), #3 (01.31.22), #4 (02.28.22), #5 (03.30.22), #6 (04.27.22), #7 (05.27.22), # 8 (06.24.22), #9 (07.25.22), #10 (08.29.22--JDM), #11 (09.26.22), #12 (10.24.22), #13 (11.28.22), #14 (01.23.23, #15 (03.20.23), [**10 moGAP in f/u**] #16 (01.26.24) #17 (02.23.24) #18(03.22.24) -- IVA resistance  - s/p IVE OS #1 (04.19.24), #2 (05.20.24), #3  (06.17.24)  - OCT shows exudative ARMD / PCV -- stable improvement in SRF overlying peripapillary CNV; persistent central VMT with central cystic changes, peripheral schisis temporal periphery caught on widefield at 4 weeks  - recommend IVE OS #4 today, 07.15.24 w/ f/u extended to 5 wks  - pt wishes to proceed with injection  - RBA of procedure discussed, questions answered - see procedure note  - BCVA OD: 20/25 - stable, OS: 20/50 -- improved - IVE informed consent obtained, signed and scanned, 04.19.24 (OS)  - f/u 5 weeks -- DFE/OCT, possible injection -- scan through schisis OS (0200-0430)  4. Retinoschisis OS  - focal, bullous schisis cavity in temporal periphery 2-430  - widefield OCT confirms schisis w/o RD  - stable  - continue monitoring for now  - may need laser if retinoschisis progresses posteriorly  5,6. Hypertensive retinopathy OU - discussed importance of tight BP control - monitor  7. Pseudophakia OU  - s/p CE/IOL OU (Dr. Randon Wilson)  - IOLs in good position, doing well -  monitor  8. Ocular hypertension OU  - IOP 16,26  - pt is taking latanoprost and Cosopt  9. PCO OS  - recommend Yag Cap OS  - pt to schedule when ready  Ophthalmic Meds Ordered this visit:  Meds ordered this encounter  Medications   aflibercept (EYLEA) SOLN 2 mg     Return in about 5 weeks (around 11/01/2022) for f/u exu ARMD OU, DFE, OCT, Possible Injxn.  There are no Patient Instructions on file for this visit.  This document serves as a record of services personally performed by Karie Chimera, MD, PhD. It was created on their behalf by De Blanch, an ophthalmic technician. The creation of this record is the provider's dictation and/or activities during the visit.    Electronically signed by: De Blanch, OA, 09/27/22  9:58 PM  This document serves as a record of services personally performed by Karie Chimera, MD, PhD. It was created on their behalf by Glee Arvin. Manson Passey, OA an  ophthalmic technician. The creation of this record is the provider's dictation and/or activities during the visit.    Electronically signed by: Glee Arvin. Manson Passey, OA 09/27/22 9:58 PM   Karie Chimera, M.D., Ph.D. Diseases & Surgery of the Retina and Vitreous [facility] 09/27/2022   I have reviewed the above documentation for accuracy and completeness, and I agree with the above. Karie Chimera, M.D., Ph.D. 09/27/22 9:58 PM   Abbreviations: M myopia (nearsighted); A astigmatism; H hyperopia (farsighted); P presbyopia; Mrx spectacle prescription;  CTL contact lenses; OD right eye; OS left eye; OU both eyes  XT exotropia; ET esotropia; PEK punctate epithelial keratitis; PEE punctate epithelial erosions; DES dry eye syndrome; MGD meibomian gland dysfunction; ATs artificial tears; PFAT's preservative free artificial tears; NSC nuclear sclerotic cataract; PSC posterior subcapsular cataract; ERM epi-retinal membrane; PVD posterior vitreous detachment; RD retinal detachment; DM diabetes mellitus; DR diabetic retinopathy; NPDR non-proliferative diabetic retinopathy; PDR proliferative diabetic retinopathy; CSME clinically significant macular edema; DME diabetic macular edema; dbh dot blot hemorrhages; CWS cotton wool spot; POAG primary open angle glaucoma; C/D cup-to-disc ratio; HVF humphrey visual field; GVF goldmann visual field; OCT optical coherence tomography; IOP intraocular pressure; BRVO Branch retinal vein occlusion; CRVO central retinal vein occlusion; CRAO central retinal artery occlusion; BRAO branch retinal artery occlusion; RT retinal tear; SB scleral buckle; PPV pars plana vitrectomy; VH Vitreous hemorrhage; PRP panretinal laser photocoagulation; IVK intravitreal kenalog; VMT vitreomacular traction; MH Macular hole;  NVD neovascularization of the disc; NVE neovascularization elsewhere; AREDS age related eye disease study; ARMD age related macular degeneration; POAG primary  open angle glaucoma; EBMD epithelial/anterior basement membrane dystrophy; ACIOL anterior chamber intraocular lens; IOL intraocular lens; PCIOL posterior chamber intraocular lens; Phaco/IOL phacoemulsification with intraocular lens placement; PRK photorefractive keratectomy; LASIK laser assisted in situ keratomileusis; HTN hypertension; DM diabetes mellitus; COPD chronic obstructive pulmonary disease

## 2022-09-27 ENCOUNTER — Ambulatory Visit (INDEPENDENT_AMBULATORY_CARE_PROVIDER_SITE_OTHER): Payer: 59 | Admitting: Ophthalmology

## 2022-09-27 ENCOUNTER — Encounter (INDEPENDENT_AMBULATORY_CARE_PROVIDER_SITE_OTHER): Payer: Self-pay | Admitting: Ophthalmology

## 2022-09-27 ENCOUNTER — Other Ambulatory Visit: Payer: Self-pay | Admitting: Cardiology

## 2022-09-27 DIAGNOSIS — H353112 Nonexudative age-related macular degeneration, right eye, intermediate dry stage: Secondary | ICD-10-CM

## 2022-09-27 DIAGNOSIS — H318 Other specified disorders of choroid: Secondary | ICD-10-CM | POA: Diagnosis not present

## 2022-09-27 DIAGNOSIS — H35033 Hypertensive retinopathy, bilateral: Secondary | ICD-10-CM

## 2022-09-27 DIAGNOSIS — H353221 Exudative age-related macular degeneration, left eye, with active choroidal neovascularization: Secondary | ICD-10-CM

## 2022-09-27 DIAGNOSIS — I10 Essential (primary) hypertension: Secondary | ICD-10-CM

## 2022-09-27 DIAGNOSIS — H33102 Unspecified retinoschisis, left eye: Secondary | ICD-10-CM

## 2022-09-27 DIAGNOSIS — Z961 Presence of intraocular lens: Secondary | ICD-10-CM

## 2022-09-27 DIAGNOSIS — H40053 Ocular hypertension, bilateral: Secondary | ICD-10-CM

## 2022-09-27 MED ORDER — AFLIBERCEPT 2MG/0.05ML IZ SOLN FOR KALEIDOSCOPE
2.0000 mg | INTRAVITREAL | Status: AC | PRN
Start: 2022-09-27 — End: 2022-09-27
  Administered 2022-09-27: 2 mg via INTRAVITREAL

## 2022-10-07 NOTE — Progress Notes (Signed)
[facility] - Clinic Note  10/14/2022     CHIEF COMPLAINT Patient presents for Retina Follow Up  HISTORY OF PRESENT ILLNESS: Kylie Wilson is a 72 y.o. female who presents to the clinic today for:   HPI     Retina Follow Up   In left eye.  This started 2.5 weeks ago.  Duration of 2.5 weeks.  Since onset it is stable.  I, the attending physician,  performed the HPI with the patient and updated documentation appropriately.        Comments   2.5 week retina follow up pt is reporting cloudy vision in the left eye she is having floaters and some flashes she is using latanoprost bid ou and dorz bid ou did not use this am       Last edited by Rennis Chris, MD on 10/14/2022 11:35 AM.    Kylie Wilson today   Referring physician: Carollee Massed, MD [facility] [address] [place], [place]  [address]  HISTORICAL INFORMATION:   Selected notes from the MEDICAL RECORD NUMBER Previous Kylie Wilson pt (2016) self-referred for possible RD LEE:  Ocular Hx- PCV diagnosed by Dr. Dorette Grate at [facility]; followed w/ Dr. Rhina Brackett for DM PMH-    CURRENT MEDICATIONS: Current Outpatient Medications (Ophthalmic Drugs)  Medication Sig   prednisoLONE acetate (PRED FORTE) 1 % ophthalmic suspension Place 1 drop into the left eye 4 (four) times daily for 7 days.   Bromfenac Sodium 0.07 % SOLN INSTILL 1 DROP IN BOTH EYES FOUR TIMES DAILY   dorzolamide-timolol (COSOPT) 2-0.5 % ophthalmic solution Place 1 drop into both eyes 2 (two) times daily.   dorzolamide-timolol (COSOPT) 22.3-6.8 MG/ML ophthalmic solution Place 1 drop into both eyes 2 (two) times daily.   No current facility-administered medications for this visit. (Ophthalmic Drugs)   Current Outpatient Medications (Other)  Medication Sig   ACCU-CHEK AVIVA PLUS test strip USE TO TEST BLOOD SUGARS DAILY.   amlodipine-olmesartan (AZOR) 10-20 MG tablet Take 1 tablet by mouth daily.   aspirin EC 81 MG tablet  Take 1 tablet (81 mg total) by mouth daily. Swallow whole.   Blood Glucose Monitoring Suppl (GLUCOCOM BLOOD GLUCOSE MONITOR) DEVI One Touch Verio Meter. FSBS BID. E11.319   Cholecalciferol (VITAMIN D3 PO) Take 5,000 Units by mouth daily.   empagliflozin (JARDIANCE) 25 MG TABS tablet Take 1 tablet by mouth daily.   hydrochlorothiazide (HYDRODIURIL) 25 MG tablet TAKE 1 TABLET BY MOUTH EVERY MORNING.   Lancets (ONETOUCH DELICA PLUS LANCET33G) MISC Apply topically.   LINZESS 72 MCG capsule Take 72 mcg by mouth daily as needed (abdominal cramping).    metFORMIN (GLUCOPHAGE) 500 MG tablet Take 1,000 mg by mouth 2 (two) times daily with a meal.    metoprolol succinate (TOPROL-XL) 25 MG 24 hr tablet TAKE 1 TAB EVERY MORNING. HOLD IF SYSTOLIC BLOOD PRESSURE (TOP NUMBER) <100 MMHG OR PULSE <60 BPM   omeprazole (PRILOSEC) 40 MG capsule Take 40 mg by mouth every morning.   pregabalin (LYRICA) 75 MG capsule Take by mouth.   rosuvastatin (CRESTOR) 10 MG tablet Take 1 tablet (10 mg total) by mouth at bedtime for 90 doses.   No current facility-administered medications for this visit. (Other)   REVIEW OF SYSTEMS: ROS   Positive for: Gastrointestinal, Neurological, Musculoskeletal, Endocrine, Cardiovascular, Eyes, Respiratory Negative for: Constitutional, Skin, Genitourinary, HENT, Psychiatric, Allergic/Imm, Heme/Lymph Last edited by Etheleen Mayhew, COT on 10/14/2022  9:08 AM.  ALLERGIES Allergies  Allergen Reactions   Duloxetine Other (See Comments)    Pt says it also caused high pressure.   Lisinopril Other (See Comments)    cough   PAST MEDICAL HISTORY Past Medical History:  Diagnosis Date   Arthritis    Barrett's esophagus    Burning pain    Cervical radiculopathy    Cervical spondylosis    Coronary artery calcification    DDD (degenerative disc disease), cervical    Diabetes mellitus    type 2    GERD (gastroesophageal reflux disease)    Hyperlipidemia    Hypertension     Hypertensive retinopathy    OU   Macular degeneration    Dry OD; Wet OS   Obesity    Rectocele    Sleep apnea    does not tolerate cpap   Uterine prolapse    Past Surgical History:  Procedure Laterality Date   BREAST BIOPSY  1980   left   BREAST EXCISIONAL BIOPSY Left    Bening 1980's   CATARACT EXTRACTION Bilateral    Dr. Randon Goldsmith   EYE SURGERY Bilateral    Cat Sx - Dr. Randon Goldsmith   SCAR REVISION  2001   both legs   TOE SURGERY  1981   4th and 5th toe right foot   TOE SURGERY  2002   great toe left   TONSILLECTOMY     TUBAL LIGATION     FAMILY HISTORY Family History  Problem Relation Age of Onset   Heart disease Father    Pancreatic cancer Father    Breast cancer Paternal Aunt    Stroke Mother    Heart disease Brother    Colon cancer Neg Hx    Stomach cancer Neg Hx    Colon polyps Neg Hx    Rectal cancer Neg Hx    SOCIAL HISTORY Social History   Tobacco Use   Smoking status: Former    Current packs/day: 0.00    Average packs/day: 0.8 packs/day for 30.0 years (24.0 ttl pk-yrs)    Types: Cigarettes    Start date: 09/14/1981    Quit date: 09/15/2011    Years since quitting: 11.0   Smokeless tobacco: Never   Tobacco comments:    smoked 20 years; then quit 10; smoked 30 years plus  Vaping Use   Vaping status: Never Used  Substance Use Topics   Alcohol use: Yes    Comment: Every evening, 3 glasses of wine    Drug use: No       OPHTHALMIC EXAM: Base Eye Exam     Visual Acuity (Snellen - Linear)       Right Left   Dist cc 20/30 20/150   Dist ph cc NI 20/100    Correction: Glasses         Tonometry (Tonopen, 9:13 AM)       Right Left   Pressure 21 29  Brim/dorz 918        Pupils       Pupils Dark Light Shape React APD   Right PERRL 3 2 Round Brisk None   Left PERRL 3 2 Round Brisk None         Visual Fields       Left Right    Full Full         Extraocular Movement       Right Left    Full, Ortho Full, Ortho  Neuro/Psych     Oriented x3: Yes   Mood/Affect: Normal         Dilation     Left eye: 2.5% Phenylephrine @ 9:11 AM           Slit Lamp and Fundus Exam     Slit Lamp Exam       Right Left   Lids/Lashes Dermatochalasis - upper lid, Meibomian gland dysfunction Dermatochalasis - upper lid, Meibomian gland dysfunction   Conjunctiva/Sclera Melanosis, 1+ Injection Melanosis, 1+ Injection   Cornea 1+ fine Punctate epithelial erosions, arcus Trace Punctate epithelial erosions, arcus   Anterior Chamber Deep and quiet deep and clear, no cell or flare   Iris Round and moderately dilated, No NVI Round and moderately dilated to 5.76mm, No NVI   Lens PC IOL in good position with open PC PC IOL in good position, 1-2+ PCO   Anterior Vitreous Vitreous syneresis, scattered silicone oil bubbles Vitreous syneresis, silicone oil bubbles         Fundus Exam       Right Left   Disc Mild Pallor, +cupping, Thin inferior rim mild pallor, Sharp rim, Peripapillary atrophy, Thin inferior rim   C/D Ratio 0.7 0.6   Macula Flat, Blunted foveal reflex, Drusen, RPE mottling and clumping, mild ERM, No heme or edema blunted foveal reflex, RPE clumping and atrophy, persistent, peripapillary SRF -- stably improved, punctate MA / IRH -- improved, +ERM, pigmented atrophy/scarring IN macula, central cystic changes -- more VMT related   Vessels attenuated, Tortuous attenuated, mild tortuosity   Periphery Attached    Attached, bullous schisis cavity from 0200-0430 -- stable; no RT/RD           Refraction     Wearing Rx       Sphere Cylinder Axis Add   Right -0.75 +2.00 163 +1.75   Left +0.00 +1.25 169 +1.75           IMAGING AND PROCEDURES  Imaging and Procedures for 10/14/2022  OCT, Retina - OU - Both Eyes       Right Eye Quality was good. Central Foveal Thickness: 217. Progression has been stable. Findings include normal foveal contour, no IRF, no SRF, retinal drusen , subretinal  hyper-reflective material, intraretinal hyper-reflective material, pigment epithelial detachment, outer retinal atrophy, vitreomacular adhesion .   Left Eye Quality was good. Central Foveal Thickness: 299. Progression has been stable. Findings include no SRF, abnormal foveal contour, retinal drusen , subretinal hyper-reflective material, choroidal neovascular membrane, intraretinal fluid, pigment epithelial detachment, vitreous traction, outer retinal atrophy, vitreomacular adhesion (stable improvement in SRF overlying peripapillary CNV; persistent central VMT with central cystic changes, peripheral schisis temporal periphery caught on widefield -- not imaged today).   Notes *Images captured and stored on drive  Diagnosis / Impression:  OD: nonexudative ARMD OS: exudative ARMD / PCV -- stable improvement in SRF overlying peripapillary CNV; persistent central VMT with central cystic changes, peripheral schisis temporal periphery caught on widefield -- not imaged today  Clinical management:  See below  Abbreviations: NFP - Normal foveal profile. CME - cystoid macular edema. PED - pigment epithelial detachment. IRF - intraretinal fluid. SRF - subretinal fluid. EZ - ellipsoid zone. ERM - epiretinal membrane. ORA - outer retinal atrophy. ORT - outer retinal tubulation. SRHM - subretinal hyper-reflective material. IRHM - intraretinal hyper-reflective material      Yag Capsulotomy - OS - Left Eye       Procedure note: YAG Capsulotomy, LEFT Eye  Informed consent obtained.  Pre-op dilating drops (1% Topicamide and 2.5% Phenylephrine), and topical anesthesia given. Power: 7.1 mJ Shots: 13 Posterior capsulotomy in cruciate formation performed without difficulty. Patient tolerated procedure well. No complications. Rx pred forte 4 times a day for 7 days, then stop. Pt received written and verbal post laser education. F/u as scheduled 08.19.24 -- DFE/OCT, possible injxn             ASSESSMENT/PLAN:   ICD-10-CM   1. 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 Essential hypertension  I10     6. Hypertensive retinopathy of both eyes  H35.033     7. Pseudophakia, both eyes  Z96.1     8. Bilateral ocular hypertension  H40.053     9. PCO (posterior capsular opacification), left  H26.492 Yag Capsulotomy - OS - Left Eye     1-3. Idiopathic polypoidal choroidal vasculopathy (PCV) OU  - lost to follow up for 6 mos from 07.18.23 to 01.26.24  - Exudative age related macular degeneration, OS (PCV)  - Nonexudative ARMD OD  - was seen in 2016 here by JDM  - transferred care to [facility] w/ Dr. Dorette Grate -- received intravitreal injxns  - then transferred to Dr. Quinn Axe -- last visit 07/2019 -- IVE OD #7 for DME  - has seen Dr. Sherryll Burger twice  - FA (11.29.21) shows peripapillary CNV OS + peripheral leakage OU consistent w/ PCV  - ICG (02.28.22) shows polypoidal choroidal vasculopathy (PCV); Focal hyper cyanscence superior to disc -- cluster of polyps   - here, s/p IVA OS #1 (11.29.21), #2 (12.31.21), #3 (01.31.22), #4 (02.28.22), #5 (03.30.22), #6 (04.27.22), #7 (05.27.22), # 8 (06.24.22), #9 (07.25.22), #10 (08.29.22--JDM), #11 (09.26.22), #12 (10.24.22), #13 (11.28.22), #14 (01.23.23, #15 (03.20.23), [**10 moGAP in f/u**] #16 (01.26.24) #17 (02.23.24) #18(03.22.24) -- IVA resistance  - s/p IVE OS #1 (04.19.24), #2 (05.20.24), #3 (06.17.24), #4 (07.15.24)  - OCT shows exudative ARMD / PCV -- stable improvement in SRF overlying peripapillary CNV; persistent central VMT with central cystic changes, peripheral schisis temporal periphery caught on widefield at 4 weeks  - BCVA OD: 20/30, OS: 20/100  -- decreased OU - IVE informed consent obtained, signed and scanned, 04.19.24 (OS)  - f/u as scheduled on 08.19.24 (~5 weeks post-injxn) -- DFE/OCT, possible injection -- scan through schisis OS (0200-0430)  4. Retinoschisis OS  - focal, bullous schisis cavity in temporal periphery 2-430  - widefield OCT confirms schisis w/o RD  - stable  - continue monitoring for now  - may need laser if retinoschisis progresses posteriorly  5,6. Hypertensive retinopathy OU - discussed importance of tight BP control - monitor  7. Pseudophakia OU  - s/p CE/IOL OU (Dr. Randon Goldsmith)  - IOLs in good position, doing well - monitor  8. Ocular hypertension OU  - IOP 16,26  - pt is taking latanoprost and Cosopt  9. PCO OS  - recommend Yag Cap OS today, 08.01.24  - pt wishes to proceed with laser  - RBA of procedure discussed, questions answered - informed consent obtained and signed - see procedure note - start PF QID OS x7 days - f/u as scheduled on 08.19.24   Ophthalmic Meds Ordered this visit:  Meds ordered this encounter  Medications   prednisoLONE acetate (PRED FORTE) 1 % ophthalmic suspension    Sig: Place 1 drop into the left eye 4 (four) times daily for 7 days.    Dispense:  1.4 mL    Refill:  0     Return for as scheduled on 08.19.24.  There are no Patient Instructions on file for this visit.  This document serves as a record of services personally performed by Karie Chimera, MD, PhD. It was created on their behalf by Glee Arvin. Manson Passey, OA an ophthalmic technician. The creation of this record is the provider's dictation and/or activities during the visit.    Electronically signed by: Glee Arvin. Manson Passey, OA 10/14/22 11:40 AM  Karie Chimera, M.D., Ph.D. Diseases & Surgery of the Retina and Vitreous [facility]  I have reviewed the above documentation for accuracy and completeness, and I agree with the above. Karie Chimera, M.D., Ph.D. 10/14/22 11:40  AM   Abbreviations: M myopia (nearsighted); A astigmatism; H hyperopia (farsighted); P presbyopia; Mrx spectacle prescription;  CTL contact lenses; OD right eye; OS left eye; OU both eyes  XT exotropia; ET esotropia; PEK punctate epithelial keratitis; PEE punctate epithelial erosions; DES dry eye syndrome; MGD meibomian gland dysfunction; ATs artificial tears; PFAT's preservative free artificial tears; NSC nuclear sclerotic cataract; PSC posterior subcapsular cataract; ERM epi-retinal membrane; PVD posterior vitreous detachment; RD retinal detachment; DM diabetes mellitus; DR diabetic retinopathy; NPDR non-proliferative diabetic retinopathy; PDR proliferative diabetic retinopathy; CSME clinically significant macular edema; DME diabetic macular edema; dbh dot blot hemorrhages; CWS cotton wool spot; POAG primary open angle glaucoma; C/D cup-to-disc ratio; HVF humphrey visual field; GVF goldmann visual field; OCT optical coherence tomography; IOP intraocular pressure; BRVO Branch retinal vein occlusion; CRVO central retinal vein occlusion; CRAO central retinal artery occlusion; BRAO branch retinal artery occlusion; RT retinal tear; SB scleral buckle; PPV pars plana vitrectomy; VH Vitreous hemorrhage; PRP panretinal laser photocoagulation; IVK intravitreal kenalog; VMT vitreomacular traction; MH Macular hole;  NVD neovascularization of the disc; NVE neovascularization elsewhere; AREDS age related eye disease study; ARMD age related macular degeneration; POAG primary open angle glaucoma; EBMD epithelial/anterior basement membrane dystrophy; ACIOL anterior chamber intraocular lens; IOL intraocular lens; PCIOL posterior chamber intraocular lens; Phaco/IOL phacoemulsification with intraocular lens placement; PRK photorefractive keratectomy; LASIK laser assisted in situ keratomileusis; HTN hypertension; DM diabetes mellitus; COPD chronic obstructive pulmonary disease

## 2022-10-14 ENCOUNTER — Encounter (INDEPENDENT_AMBULATORY_CARE_PROVIDER_SITE_OTHER): Payer: Self-pay | Admitting: Ophthalmology

## 2022-10-14 ENCOUNTER — Encounter (INDEPENDENT_AMBULATORY_CARE_PROVIDER_SITE_OTHER): Payer: 59 | Admitting: Ophthalmology

## 2022-10-14 ENCOUNTER — Ambulatory Visit (INDEPENDENT_AMBULATORY_CARE_PROVIDER_SITE_OTHER): Payer: 59 | Admitting: Ophthalmology

## 2022-10-14 DIAGNOSIS — H26492 Other secondary cataract, left eye: Secondary | ICD-10-CM | POA: Diagnosis not present

## 2022-10-14 DIAGNOSIS — H353112 Nonexudative age-related macular degeneration, right eye, intermediate dry stage: Secondary | ICD-10-CM | POA: Diagnosis not present

## 2022-10-14 DIAGNOSIS — H318 Other specified disorders of choroid: Secondary | ICD-10-CM

## 2022-10-14 DIAGNOSIS — H353221 Exudative age-related macular degeneration, left eye, with active choroidal neovascularization: Secondary | ICD-10-CM | POA: Diagnosis not present

## 2022-10-14 DIAGNOSIS — H33102 Unspecified retinoschisis, left eye: Secondary | ICD-10-CM | POA: Diagnosis not present

## 2022-10-14 DIAGNOSIS — H40053 Ocular hypertension, bilateral: Secondary | ICD-10-CM

## 2022-10-14 DIAGNOSIS — Z961 Presence of intraocular lens: Secondary | ICD-10-CM

## 2022-10-14 DIAGNOSIS — I10 Essential (primary) hypertension: Secondary | ICD-10-CM

## 2022-10-14 DIAGNOSIS — H35033 Hypertensive retinopathy, bilateral: Secondary | ICD-10-CM

## 2022-10-14 MED ORDER — PREDNISOLONE ACETATE 1 % OP SUSP: 1.0000 [drp] | Freq: Four times a day (QID) | OPHTHALMIC | 0 refills | Status: AC

## 2022-10-26 NOTE — Progress Notes (Signed)
[facility] - Clinic Note  11/01/2022     CHIEF COMPLAINT Patient presents for Retina Follow Up  HISTORY OF PRESENT ILLNESS: Kylie Wilson is a 72 y.o. female who presents to the clinic today for:   HPI     Retina Follow Up   Patient presents with  Wet AMD.  In both eyes.  This started 5 weeks ago.  I, the attending physician,  performed the HPI with the patient and updated documentation appropriately.        Comments   Patient here for 5 weeks retina follow up for exu ARMD OU. Patient states vision is better since had laser. No eye pain.       Last edited by Kylie Chris, MD on 11/01/2022  4:28 PM.    Patient states the vision has improved after the YAG in the left eye.  She is using her glaucoma drops faithfully.   Referring physician: Carollee Massed, MD [facility] 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 Rhina Wilson for DM PMH-    CURRENT MEDICATIONS: Current Outpatient Medications (Ophthalmic Drugs)  Medication Sig   Bromfenac Sodium 0.07 % SOLN INSTILL 1 DROP IN BOTH EYES FOUR TIMES DAILY   dorzolamide-timolol (COSOPT) 2-0.5 % ophthalmic solution Place 1 drop into both eyes 2 (two) times daily.   dorzolamide-timolol (COSOPT) 22.3-6.8 MG/ML ophthalmic solution Place 1 drop into both eyes 2 (two) times daily.   No current facility-administered medications for this visit. (Ophthalmic Drugs)   Current Outpatient Medications (Other)  Medication Sig   ACCU-CHEK AVIVA PLUS test strip USE TO TEST BLOOD SUGARS DAILY.   amlodipine-olmesartan (AZOR) 10-20 MG tablet Take 1 tablet by mouth daily.   aspirin EC 81 MG tablet Take 1 tablet (81 mg total) by mouth daily. Swallow whole.   Blood Glucose Monitoring Suppl  (GLUCOCOM BLOOD GLUCOSE MONITOR) DEVI One Touch Verio Meter. FSBS BID. E11.319   Cholecalciferol (VITAMIN D3 PO) Take 5,000 Units by mouth daily.   empagliflozin (JARDIANCE) 25 MG TABS tablet Take 1 tablet by mouth daily.   hydrochlorothiazide (HYDRODIURIL) 25 MG tablet TAKE 1 TABLET BY MOUTH EVERY MORNING.   Lancets (ONETOUCH DELICA PLUS LANCET33G) MISC Apply topically.   LINZESS 72 MCG capsule Take 72 mcg by mouth daily as needed (abdominal cramping).    metFORMIN (GLUCOPHAGE) 500 MG tablet Take 1,000 mg by mouth 2 (two) times daily with a meal.    metoprolol succinate (TOPROL-XL) 25 MG 24 hr tablet TAKE 1 TAB EVERY MORNING. HOLD IF SYSTOLIC BLOOD PRESSURE (TOP NUMBER) <100 MMHG OR PULSE <60 BPM   omeprazole (PRILOSEC) 40 MG capsule Take 40 mg by mouth every morning.   pregabalin (LYRICA) 75 MG capsule Take by mouth.   rosuvastatin (CRESTOR) 10 MG tablet Take 1 tablet (10 mg total) by mouth at bedtime for 90 doses.   No current facility-administered medications for this visit. (Other)   REVIEW OF SYSTEMS: ROS   Positive for: Gastrointestinal, Neurological, Musculoskeletal, Endocrine, Cardiovascular, Eyes, Respiratory Negative for: Constitutional, Skin, Genitourinary, HENT, Psychiatric, Allergic/Imm, Heme/Lymph Last edited by Kylie Wilson, COA on 11/01/2022  2:05 PM.       ALLERGIES Allergies  Allergen Reactions   Duloxetine Other (See Comments)    Pt says it also caused high  pressure.   Lisinopril Other (See Comments)    cough   PAST MEDICAL HISTORY Past Medical History:  Diagnosis Date   Arthritis    Barrett's esophagus    Burning pain    Cervical radiculopathy    Cervical spondylosis    Coronary artery calcification    DDD (degenerative disc disease), cervical    Diabetes mellitus    type 2    GERD (gastroesophageal reflux disease)    Hyperlipidemia    Hypertension    Hypertensive retinopathy    OU   Macular degeneration    Dry OD; Wet OS   Obesity     Rectocele    Sleep apnea    does not tolerate cpap   Uterine prolapse    Past Surgical History:  Procedure Laterality Date   BREAST BIOPSY  1980   left   BREAST EXCISIONAL BIOPSY Left    Bening 1980's   CATARACT EXTRACTION Bilateral    Dr. Randon Goldsmith   EYE SURGERY Bilateral    Cat Sx - Dr. Randon Goldsmith   SCAR REVISION  2001   both legs   TOE SURGERY  1981   4th and 5th toe right foot   TOE SURGERY  2002   great toe left   TONSILLECTOMY     TUBAL LIGATION     FAMILY HISTORY Family History  Problem Relation Age of Onset   Heart disease Father    Pancreatic cancer Father    Breast cancer Paternal Aunt    Stroke Mother    Heart disease Brother    Colon cancer Neg Hx    Stomach cancer Neg Hx    Colon polyps Neg Hx    Rectal cancer Neg Hx    SOCIAL HISTORY Social History   Tobacco Use   Smoking status: Former    Current packs/day: 0.00    Average packs/day: 0.8 packs/day for 30.0 years (24.0 ttl pk-yrs)    Types: Cigarettes    Start date: 09/14/1981    Quit date: 09/15/2011    Years since quitting: 11.1   Smokeless tobacco: Never   Tobacco comments:    smoked 20 years; then quit 10; smoked 30 years plus  Vaping Use   Vaping status: Never Used  Substance Use Topics   Alcohol use: Yes    Comment: Every evening, 3 glasses of wine    Drug use: No       OPHTHALMIC EXAM: Base Eye Exam     Visual Acuity (Snellen - Linear)       Right Left   Dist cc 20/25 -1 20/50 -1   Dist ph cc  NI    Correction: Glasses         Tonometry (Tonopen, 2:02 PM)       Right Left   Pressure 19 25.25  Patient states pressure always high in OS.        Pupils       Dark Light Shape React APD   Right 3 2 Round Brisk None   Left 3 2 Round Brisk None         Visual Fields (Counting fingers)       Left Right    Full Full         Extraocular Movement       Right Left    Full, Ortho Full, Ortho         Neuro/Psych     Oriented x3: Yes   Mood/Affect: Normal  Dilation     Both eyes: 1.0% Mydriacyl, 2.5% Phenylephrine @ 2:02 PM           Slit Lamp and Fundus Exam     Slit Lamp Exam       Right Left   Lids/Lashes Dermatochalasis - upper lid, Meibomian gland dysfunction Dermatochalasis - upper lid, Meibomian gland dysfunction   Conjunctiva/Sclera Melanosis, 1+ Injection Melanosis, 1+ Injection   Cornea 1+ fine Punctate epithelial erosions, arcus 1-2+ central Punctate epithelial erosions, arcus   Anterior Chamber Deep and quiet deep and clear, no cell or flare   Iris Round and moderately dilated, No NVI Round and moderately dilated to 5.65mm, No NVI   Lens PC IOL in good position with open PC PC IOL in good postion with open PC   Anterior Vitreous Vitreous syneresis, scattered silicone oil bubbles Vitreous syneresis, silicone oil bubbles         Fundus Exam       Right Left   Disc Mild Pallor, +cupping, Thin inferior rim mild pallor, Sharp rim, Peripapillary atrophy, Thin inferior rim   C/D Ratio 0.7 0.6   Macula Flat, Blunted foveal reflex, Drusen, RPE mottling and clumping, mild ERM, No heme or edema blunted foveal reflex, RPE clumping and atrophy, persistent, peripapillary SRF -- stably improved, punctate MA / IRH -- improved, +ERM, pigmented atrophy/scarring IN macula, central cystic changes -- more VMT related   Vessels attenuated, Tortuous attenuated, mild tortuosity   Periphery Attached Attached, bullous schisis cavity from 0200-0430 -- stable; no RT/RD           Refraction     Wearing Rx       Sphere Cylinder Axis Add   Right -0.75 +2.00 163 +1.75   Left +0.00 +1.25 169 +1.75           IMAGING AND PROCEDURES  Imaging and Procedures for 11/01/2022  OCT, Retina - OU - Both Eyes       Right Eye Quality was good. Central Foveal Thickness: 219. Progression has been stable. Findings include normal foveal contour, no IRF, no SRF, retinal drusen , subretinal hyper-reflective material, intraretinal hyper-reflective  material, pigment epithelial detachment, outer retinal atrophy, vitreomacular adhesion .   Left Eye Quality was good. Central Foveal Thickness: 302. Progression has been stable. Findings include no SRF, abnormal foveal contour, retinal drusen , subretinal hyper-reflective material, choroidal neovascular membrane, intraretinal fluid, pigment epithelial detachment, vitreous traction, outer retinal atrophy, vitreomacular adhesion (stable improvement in SRF overlying peripapillary CNV; persistent central VMT with central cystic changes, peripheral schisis temporal periphery caught on widefield -- not imaged today).   Notes *Images captured and stored on drive  Diagnosis / Impression:  OD: nonexudative ARMD OS: exudative ARMD / PCV -- stable improvement in SRF overlying peripapillary CNV; persistent central VMT with central cystic changes, peripheral schisis temporal periphery caught on widefield -- not imaged today  Clinical management:  See below  Abbreviations: NFP - Normal foveal profile. CME - cystoid macular edema. PED - pigment epithelial detachment. IRF - intraretinal fluid. SRF - subretinal fluid. EZ - ellipsoid zone. ERM - epiretinal membrane. ORA - outer retinal atrophy. ORT - outer retinal tubulation. SRHM - subretinal hyper-reflective material. IRHM - intraretinal hyper-reflective material      Intravitreal Injection, Pharmacologic Agent - OS - Left Eye       Time Out 11/01/2022. 2:42 PM. Confirmed correct patient, procedure, site, and patient consented.   Anesthesia Topical anesthesia was used. Anesthetic medications included Lidocaine 2%, Proparacaine 0.5%.  Procedure Preparation included 5% betadine to ocular surface, eyelid speculum. A (32g) needle was used.   Injection: 2 mg aflibercept 2 MG/0.05ML   Route: Intravitreal, Site: Left Eye   NDC: [id number], Lot: [id number], Expiration date: 12/13/2023, Waste: 0 mL   Post-op Post injection exam found visual acuity of  at least counting fingers. The patient tolerated the procedure well. There were no complications. The patient received written and verbal post procedure care education. Post injection medications were not given.             ASSESSMENT/PLAN:   ICD-10-CM   1. 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 Essential hypertension  I10     6. Hypertensive retinopathy of both eyes  H35.033     7. Pseudophakia, both eyes  Z96.1     8. Bilateral ocular hypertension  H40.053     9. Diabetes mellitus without complication (HCC)  E11.9     10. Diabetes mellitus treated with oral medication (HCC)  E11.9    Z79.84      1-3. Idiopathic polypoidal choroidal vasculopathy (PCV) OU  - lost to follow up for 6 mos from 07.18.23 to 01.26.24  - Exudative age related macular degeneration, OS (PCV)  - Nonexudative ARMD OD  - was seen in 2016 here by JDM  - transferred care to [facility] w/ Dr. Dorette Wilson -- received intravitreal injxns  - then transferred to Dr. Quinn Axe -- last visit 07/2019 -- IVE OD #7 for DME  - has seen Dr. Sherryll Burger twice - FA (11.29.21) shows peripapillary CNV OS + peripheral leakage OU consistent w/ PCV - ICG (02.28.22) shows polypoidal choroidal vasculopathy (PCV); Focal hyper cyanscence superior to disc -- cluster of polyps  - here, s/p IVA OS #1 (11.29.21), #2 (12.31.21), #3 (01.31.22), #4 (02.28.22), #5 (03.30.22), #6 (04.27.22), #7 (05.27.22), # 8 (06.24.22), #9 (07.25.22), #10 (08.29.22--JDM), #11 (09.26.22), #12 (10.24.22), #13 (11.28.22), #14 (01.23.23, #15 (03.20.23), [**10 moGAP in f/u**] #16  (01.26.24) #17 (02.23.24) #18(03.22.24) -- IVA resistance  - s/p IVE OS #1 (04.19.24), #2 (05.20.24), #3 (06.17.24), #4 (07.15.24) - OCT OS shows exudative ARMD / PCV -- stable improvement in SRF overlying peripapillary CNV; persistent central VMT with central cystic changes, peripheral schisis temporal periphery caught on widefield at 5 weeks  - BCVA OD: 20/25 from 20/30, OS: 20/50 from 20/100 post YAG cap OS  - recommend IVE OS #5 today, 08.19.24 w/ ext f/u to 6 wks - RBA of procedure discussed, questions answered - IVE informed consent obtained, signed and scanned, 04.19.24 (OS) - see procedure note  - f/u 6 weeks, DFE, OCT, possible injection -- scan through schisis OS (0200-0430)  4. Retinoschisis OS  - focal, bullous schisis cavity in temporal periphery 2-430  - widefield OCT confirms schisis w/o RD  - stable  - continue monitoring for now  - may need laser if retinoschisis progresses posteriorly  5,6. Hypertensive retinopathy OU - discussed importance of tight BP control - monitor  7. Pseudophakia OU  - s/p CE/IOL OU (Dr. Randon Goldsmith)  - IOLs in good position, doing well  -  s/p Yag Cap OS  (08.01.24) -- BCVA OS 20/50 from 20/100 - monitor  8. Ocular hypertension OU  - IOP 19,25  - pt is taking Latanoprost and Cosopt  9,10. Diabetes mellitus, type 2 without retinopathy  - last A1C is 8.1 (08.07.24) - The incidence, risk factors for progression, natural history and treatment options for diabetic retinopathy  were discussed with patient.   - The need for close monitoring of blood glucose, blood pressure, and serum lipids, avoiding cigarette or any type of tobacco, and the need for long term follow up was also discussed with patient. - f/u in 1 year, sooner prn     Ophthalmic Meds Ordered this visit:  Meds ordered this encounter  Medications   aflibercept (EYLEA) SOLN 2 mg     Return in about 6 weeks (around 12/13/2022) for f/u Ex AMD OS , DFE, OCT, Possible, IVE, OS.  There  are no Patient Instructions on file for this visit.  This document serves as a record of services personally performed by Karie Chimera, MD, PhD. It was created on their behalf by De Blanch, an ophthalmic technician. The creation of this record is the provider's dictation and/or activities during the visit.    Electronically signed by: De Blanch, OA, 11/01/22  4:33 PM  This document serves as a record of services personally performed by Karie Chimera, MD, PhD. It was created on their behalf by Glee Arvin. Manson Passey, OA an ophthalmic technician. The creation of this record is the provider's dictation and/or activities during the visit.    Electronically signed by: Glee Arvin. Manson Passey, OA 11/01/22 4:33 PM  This document serves as a record of services personally performed by Karie Chimera, MD, PhD. It was created on their behalf by Laurey Morale, COT an ophthalmic technician. The creation of this record is the provider's dictation and/or activities during the visit.    Electronically signed by:  Charlette Caffey, COT  11/01/22 4:33 PM   Karie Chimera, M.D., Ph.D. Diseases & Surgery of the Retina and Vitreous [facility]  I have reviewed the above documentation for accuracy and completeness, and I agree with the above. Karie Chimera, M.D., Ph.D. 11/01/22 4:38 PM   Abbreviations: M myopia (nearsighted); A astigmatism; H hyperopia (farsighted); P presbyopia; Mrx spectacle prescription;  CTL contact lenses; OD right eye; OS left eye; OU both eyes  XT exotropia; ET esotropia; PEK punctate epithelial keratitis; PEE punctate epithelial erosions; DES dry eye syndrome; MGD meibomian gland dysfunction; ATs artificial tears; PFAT's preservative free artificial tears; NSC nuclear sclerotic cataract; PSC posterior subcapsular cataract; ERM epi-retinal membrane; PVD posterior vitreous detachment; RD retinal detachment; DM diabetes mellitus; DR diabetic retinopathy;  NPDR non-proliferative diabetic retinopathy; PDR proliferative diabetic retinopathy; CSME clinically significant macular edema; DME diabetic macular edema; dbh dot blot hemorrhages; CWS cotton wool spot; POAG primary open angle glaucoma; C/D cup-to-disc ratio; HVF humphrey visual field; GVF goldmann visual field; OCT optical coherence tomography; IOP intraocular pressure; BRVO Branch retinal vein occlusion; CRVO central retinal vein occlusion; CRAO central retinal artery occlusion; BRAO branch retinal artery occlusion; RT retinal tear; SB scleral buckle; PPV pars plana vitrectomy; VH Vitreous hemorrhage; PRP panretinal laser photocoagulation; IVK intravitreal kenalog; VMT vitreomacular traction; MH Macular hole;  NVD neovascularization of the disc; NVE neovascularization elsewhere; AREDS age related eye disease study; ARMD age related macular degeneration; POAG primary open angle glaucoma; EBMD epithelial/anterior basement membrane dystrophy; ACIOL anterior chamber intraocular lens; IOL intraocular lens;  PCIOL posterior chamber intraocular lens; Phaco/IOL phacoemulsification with intraocular lens placement; PRK photorefractive keratectomy; LASIK laser assisted in situ keratomileusis; HTN hypertension; DM diabetes mellitus; COPD chronic obstructive pulmonary disease

## 2022-11-01 ENCOUNTER — Encounter (INDEPENDENT_AMBULATORY_CARE_PROVIDER_SITE_OTHER): Payer: Self-pay | Admitting: Ophthalmology

## 2022-11-01 ENCOUNTER — Ambulatory Visit (INDEPENDENT_AMBULATORY_CARE_PROVIDER_SITE_OTHER): Payer: 59 | Admitting: Ophthalmology

## 2022-11-01 DIAGNOSIS — H33102 Unspecified retinoschisis, left eye: Secondary | ICD-10-CM

## 2022-11-01 DIAGNOSIS — H26492 Other secondary cataract, left eye: Secondary | ICD-10-CM

## 2022-11-01 DIAGNOSIS — I10 Essential (primary) hypertension: Secondary | ICD-10-CM

## 2022-11-01 DIAGNOSIS — H353112 Nonexudative age-related macular degeneration, right eye, intermediate dry stage: Secondary | ICD-10-CM

## 2022-11-01 DIAGNOSIS — Z7984 Long term (current) use of oral hypoglycemic drugs: Secondary | ICD-10-CM

## 2022-11-01 DIAGNOSIS — H353221 Exudative age-related macular degeneration, left eye, with active choroidal neovascularization: Secondary | ICD-10-CM | POA: Diagnosis not present

## 2022-11-01 DIAGNOSIS — E119 Type 2 diabetes mellitus without complications: Secondary | ICD-10-CM

## 2022-11-01 DIAGNOSIS — Z961 Presence of intraocular lens: Secondary | ICD-10-CM

## 2022-11-01 DIAGNOSIS — H35033 Hypertensive retinopathy, bilateral: Secondary | ICD-10-CM

## 2022-11-01 DIAGNOSIS — H40053 Ocular hypertension, bilateral: Secondary | ICD-10-CM

## 2022-11-01 DIAGNOSIS — H318 Other specified disorders of choroid: Secondary | ICD-10-CM

## 2022-11-01 MED ORDER — AFLIBERCEPT 2MG/0.05ML IZ SOLN FOR KALEIDOSCOPE
2.0000 mg | INTRAVITREAL | Status: AC | PRN
Start: 2022-11-01 — End: 2022-11-01
  Administered 2022-11-01: 2 mg via INTRAVITREAL

## 2022-12-10 NOTE — Progress Notes (Signed)
[facility] - Clinic Note  12/13/2022     CHIEF COMPLAINT Patient presents for Retina Follow Up  HISTORY OF PRESENT ILLNESS: Kylie Wilson is a 72 y.o. female who presents to the clinic today for:   HPI     Retina Follow Up   Patient presents with  Wet AMD.  In both eyes.  This started months ago.  Duration of 6 weeks.  I, the attending physician,  performed the HPI with the patient and updated documentation appropriately.        Comments   Patient feels the vision is no worse. She is using Cosopt OU BID. Her blood sugar was 147.       Last edited by Kylie Chris, MD on 12/13/2022  5:33 PM.    Patient feels the vision is better when reading the eye chart.   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 Dorette Grate at [facility]; followed w/ Dr. Rhina Brackett for DM PMH-    CURRENT MEDICATIONS: Current Outpatient Medications (Ophthalmic Drugs)  Medication Sig   Bromfenac Sodium 0.07 % SOLN INSTILL 1 DROP IN BOTH EYES FOUR TIMES DAILY   dorzolamide-timolol (COSOPT) 2-0.5 % ophthalmic solution Place 1 drop into both eyes 2 (two) times daily.   dorzolamide-timolol (COSOPT) 22.3-6.8 MG/ML ophthalmic solution Place 1 drop into both eyes 2 (two) times daily.   No current facility-administered medications for this visit. (Ophthalmic Drugs)   Current Outpatient Medications (Other)  Medication Sig   ACCU-CHEK AVIVA PLUS test strip USE TO TEST BLOOD SUGARS DAILY.   amlodipine-olmesartan (AZOR) 10-20 MG tablet Take 1 tablet by mouth daily.   aspirin EC 81 MG tablet Take 1 tablet (81 mg total) by mouth daily. Swallow whole.   Blood Glucose Monitoring Suppl (GLUCOCOM BLOOD GLUCOSE MONITOR) DEVI One Touch Verio Meter. FSBS  BID. E11.319   Cholecalciferol (VITAMIN D3 PO) Take 5,000 Units by mouth daily.   empagliflozin (JARDIANCE) 25 MG TABS tablet Take 1 tablet by mouth daily.   hydrochlorothiazide (HYDRODIURIL) 25 MG tablet TAKE 1 TABLET BY MOUTH EVERY MORNING.   Lancets (ONETOUCH DELICA PLUS LANCET33G) MISC Apply topically.   LINZESS 72 MCG capsule Take 72 mcg by mouth daily as needed (abdominal cramping).    metFORMIN (GLUCOPHAGE) 500 MG tablet Take 1,000 mg by mouth 2 (two) times daily with a meal.    metoprolol succinate (TOPROL-XL) 25 MG 24 hr tablet TAKE 1 TAB EVERY MORNING. HOLD IF SYSTOLIC BLOOD PRESSURE (TOP NUMBER) <100 MMHG OR PULSE <60 BPM   omeprazole (PRILOSEC) 40 MG capsule Take 40 mg by mouth every morning.   pregabalin (LYRICA) 75 MG capsule Take by mouth.   rosuvastatin (CRESTOR) 10 MG tablet Take 1 tablet (10 mg total) by mouth at bedtime for 90 doses.   No current facility-administered medications for this visit. (Other)   REVIEW OF SYSTEMS: ROS   Positive for: Gastrointestinal, Neurological, Musculoskeletal, Endocrine, Cardiovascular, Eyes, Respiratory Negative for: Constitutional, Skin, Genitourinary, HENT, Psychiatric, Allergic/Imm, Heme/Lymph Last edited by Kylie Wilson, COT on 12/13/2022  1:37 PM.     ALLERGIES Allergies  Allergen Reactions   Duloxetine Other (See Comments)    Pt says it also caused high pressure.   Lisinopril Other (See Comments)    cough  PAST MEDICAL HISTORY Past Medical History:  Diagnosis Date   Arthritis    Barrett's esophagus    Burning pain    Cervical radiculopathy    Cervical spondylosis    Coronary artery calcification    DDD (degenerative disc disease), cervical    Diabetes mellitus    type 2    GERD (gastroesophageal reflux disease)    Hyperlipidemia    Hypertension    Hypertensive retinopathy    OU   Macular degeneration    Dry OD; Wet OS   Obesity    Rectocele    Sleep apnea    does not tolerate cpap   Uterine prolapse     Past Surgical History:  Procedure Laterality Date   BREAST BIOPSY  1980   left   BREAST EXCISIONAL BIOPSY Left    Bening 1980's   CATARACT EXTRACTION Bilateral    Dr. Randon Wilson   EYE SURGERY Bilateral    Cat Sx - Dr. Randon Wilson   SCAR REVISION  2001   both legs   TOE SURGERY  1981   4th and 5th toe right foot   TOE SURGERY  2002   great toe left   TONSILLECTOMY     TUBAL LIGATION     FAMILY HISTORY Family History  Problem Relation Age of Onset   Heart disease Father    Pancreatic cancer Father    Breast cancer Paternal Aunt    Stroke Mother    Heart disease Brother    Colon cancer Neg Hx    Stomach cancer Neg Hx    Colon polyps Neg Hx    Rectal cancer Neg Hx    SOCIAL HISTORY Social History   Tobacco Use   Smoking status: Former    Current packs/day: 0.00    Average packs/day: 0.8 packs/day for 30.0 years (24.0 ttl pk-yrs)    Types: Cigarettes    Start date: 09/14/1981    Quit date: 09/15/2011    Years since quitting: 11.2   Smokeless tobacco: Never   Tobacco comments:    smoked 20 years; then quit 10; smoked 30 years plus  Vaping Use   Vaping status: Never Used  Substance Use Topics   Alcohol use: Yes    Comment: Every evening, 3 glasses of wine    Drug use: No       OPHTHALMIC EXAM: Base Eye Exam     Visual Acuity (Snellen - Linear)       Right Left   Dist cc 20/25 20/40   Dist ph cc NI 20/30 +1         Tonometry (Tonopen, 1:41 PM)       Right Left   Pressure 19 19         Pupils       Dark Light Shape React APD   Right 3 2 Round Brisk None   Left 3 2 Round Brisk None         Visual Fields       Left Right    Full Full         Extraocular Movement       Right Left    Full, Ortho Full, Ortho         Neuro/Psych     Oriented x3: Yes   Mood/Affect: Normal         Dilation     Both eyes: 1.0% Mydriacyl, 2.5% Phenylephrine @ 1:38 PM  Slit Lamp and Fundus Exam     Slit Lamp Exam       Right Left    Lids/Lashes Dermatochalasis - upper lid, Meibomian gland dysfunction Dermatochalasis - upper lid, Meibomian gland dysfunction   Conjunctiva/Sclera Melanosis, 1+ Injection Melanosis, 1+ Injection   Cornea 1+ fine Punctate epithelial erosions, arcus 1-2+ central Punctate epithelial erosions, arcus   Anterior Chamber Deep and quiet deep and clear, no cell or flare   Iris Round and moderately dilated, No NVI Round and moderately dilated to 5.10mm, No NVI   Lens PC IOL in good position with open PC PC IOL in good postion with open PC   Anterior Vitreous Vitreous syneresis, scattered silicone oil bubbles Vitreous syneresis, silicone oil bubbles         Fundus Exam       Right Left   Disc Mild Pallor, +cupping, Thin inferior rim mild pallor, Sharp rim, Peripapillary atrophy, Thin inferior rim   C/D Ratio 0.7 0.6   Macula Flat, Blunted foveal reflex, Drusen, RPE mottling and clumping, mild ERM, No heme or edema blunted foveal reflex, RPE clumping and atrophy, persistent, peripapillary SRF -- stably improved, punctate MA / IRH -- improved, +ERM, pigmented atrophy/scarring IN macula, central cystic changes -- VMT related   Vessels attenuated, Tortuous attenuated, mild tortuosity   Periphery Attached Attached, bullous schisis cavity from 0200-0430 -- stable; no RT/RD           Refraction     Wearing Rx       Sphere Cylinder Axis Add   Right -0.75 +2.00 163 +1.75   Left +0.00 +1.25 169 +1.75           IMAGING AND PROCEDURES  Imaging and Procedures for 12/13/2022  OCT, Retina - OU - Both Eyes       Right Eye Quality was good. Central Foveal Thickness: 217. Progression has been stable. Findings include normal foveal contour, no IRF, no SRF, retinal drusen , subretinal hyper-reflective material, intraretinal hyper-reflective material, pigment epithelial detachment, outer retinal atrophy, vitreomacular adhesion .   Left Eye Quality was good. Central Foveal Thickness: 281. Progression  has been stable. Findings include no SRF, abnormal foveal contour, retinal drusen , subretinal hyper-reflective material, choroidal neovascular membrane, intraretinal fluid, pigment epithelial detachment, vitreous traction, outer retinal atrophy, vitreomacular adhesion (stable improvement in SRF overlying peripapillary CNV; persistent central VMT with central cystic changes, peripheral schisis temporal periphery caught on widefield -- not imaged today).   Notes *Images captured and stored on drive  Diagnosis / Impression:  OD: nonexudative ARMD OS: exudative ARMD / PCV -- stable improvement in SRF overlying peripapillary CNV; persistent central VMT with central cystic changes, peripheral schisis temporal periphery caught on widefield -- not imaged today  Clinical management:  See below  Abbreviations: NFP - Normal foveal profile. CME - cystoid macular edema. PED - pigment epithelial detachment. IRF - intraretinal fluid. SRF - subretinal fluid. EZ - ellipsoid zone. ERM - epiretinal membrane. ORA - outer retinal atrophy. ORT - outer retinal tubulation. SRHM - subretinal hyper-reflective material. IRHM - intraretinal hyper-reflective material      Intravitreal Injection, Pharmacologic Agent - OS - Left Eye       Time Out 12/13/2022. 2:14 PM. Confirmed correct patient, procedure, site, and patient consented.   Anesthesia Topical anesthesia was used. Anesthetic medications included Lidocaine 2%, Proparacaine 0.5%.   Procedure Preparation included 5% betadine to ocular surface, eyelid speculum. A (32g) needle was used.   Injection: 2 mg aflibercept 2  MG/0.05ML   Route: Intravitreal, Site: Left Eye   NDC: L6038910, Lot: [id number], Expiration date: 01/13/2024, Waste: 0 mL   Post-op Post injection exam found visual acuity of at least counting fingers. The patient tolerated the procedure well. There were no complications. The patient received written and verbal post procedure care  education. Post injection medications were not given.            ASSESSMENT/PLAN:   ICD-10-CM   1. 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 Essential hypertension  I10     6. Hypertensive retinopathy of both eyes  H35.033     7. Pseudophakia, both eyes  Z96.1     8. Bilateral ocular hypertension  H40.053     9. Diabetes mellitus without complication (HCC)  E11.9     10. Long-term current use of injectable noninsulin antidiabetic medication  Z79.85     11. Diabetes mellitus treated with oral medication (HCC)  E11.9    Z79.84      1-3. Idiopathic polypoidal choroidal vasculopathy (PCV) OU  - lost to follow up for 6 mos from 07.18.23 to 01.26.24  - Exudative age related macular degeneration, OS (PCV)  - Nonexudative ARMD OD  - was seen in 2016 here by JDM  - transferred care to [facility] w/ Dr. Dorette Grate -- received intravitreal injxns  - then transferred to Dr. Quinn Axe -- last visit 07/2019 -- IVE OD #7 for DME  - has seen Dr. Sherryll Burger twice - FA (11.29.21) shows peripapillary CNV OS + peripheral leakage OU consistent w/ PCV - ICG (02.28.22) shows polypoidal choroidal vasculopathy (PCV); Focal hyper cyanscence superior to disc -- cluster of polyps  - here, s/p IVA OS #1 (11.29.21), #2 (12.31.21), #3 (01.31.22), #4 (02.28.22), #5 (03.30.22), #6 (04.27.22), #7 (05.27.22), # 8 (06.24.22), #9 (07.25.22), #10 (08.29.22--JDM), #11 (09.26.22), #12 (10.24.22), #13 (11.28.22), #14 (01.23.23, #15 (03.20.23), [**10 moGAP in f/u**] #16 (01.26.24) #17 (02.23.24) #18(03.22.24) -- IVA resistance  - s/p IVE OS #1  (04.19.24), #2 (05.20.24), #3 (06.17.24), #4 (07.15.24), #5 (08.19.24) - OCT OS shows exudative ARMD / PCV -- stable improvement in SRF overlying peripapillary CNV; persistent central VMT with central cystic changes, peripheral schisis temporal periphery caught on widefield at 6 weeks  - BCVA OD: 20/25 - stable, OS: 20/30 from 20/50  - recommend IVE OS #6 today, 09.30.24 w/ ext f/u to 7 wks - RBA of procedure discussed, questions answered - IVE informed consent obtained, signed and scanned, 04.19.24 (OS) - see procedure note  - f/u 7 weeks, DFE, OCT, possible injection -- scan through schisis OS (0200-0430)  4. Retinoschisis OS  - focal, bullous schisis cavity in temporal periphery 2-430  - widefield OCT confirms schisis w/o RD  - stable  - continue monitoring for now  - may need laser if retinoschisis progresses posteriorly  5,6. Hypertensive retinopathy OU - discussed importance of tight BP control - monitor  7. Pseudophakia OU  - s/p CE/IOL OU (Dr. Randon Wilson)  - IOLs in good position, doing well  - s/p Yag Cap OS  (08.01.24) - monitor  8. Ocular hypertension OU  - IOP 19,25  - patient is taking latanoprost and cosopt  9-11. Diabetes mellitus, type 2 without retinopathy  - last A1C is 8.1 (08.07.24) - The incidence, risk factors for progression, natural history and treatment options for diabetic retinopathy  were discussed with patient.   - The need for close monitoring of blood glucose, blood pressure, and serum lipids, avoiding cigarette or any type of tobacco, and the need for long term follow up was also discussed with patient. - f/u in 1 year, sooner prn    Ophthalmic Meds Ordered this visit:  Meds ordered this encounter  Medications   aflibercept (EYLEA) SOLN 2 mg     Return in about 7 weeks (around 01/31/2023) for f/u PCV OU, DFE, OCT, Possible, IVE, OS.  There are no Patient Instructions on file for this visit.  This document serves as a record of services  personally performed by Karie Chimera, MD, PhD. It was created on their behalf by Annalee Genta, COMT. The creation of this record is the provider's dictation and/or activities during the visit.  Electronically signed by: Annalee Genta, COMT 12/13/22 5:34 PM  Karie Chimera, M.D., Ph.D. Diseases & Surgery of the Retina and Vitreous [facility]  I have reviewed the above documentation for accuracy and completeness, and I agree with the above. Karie Chimera, M.D., Ph.D. 12/13/22 5:36 PM  Abbreviations: M myopia (nearsighted); A astigmatism; H hyperopia (farsighted); P presbyopia; Mrx spectacle prescription;  CTL contact lenses; OD right eye; OS left eye; OU both eyes  XT exotropia; ET esotropia; PEK punctate epithelial keratitis; PEE punctate epithelial erosions; DES dry eye syndrome; MGD meibomian gland dysfunction; ATs artificial tears; PFAT's preservative free artificial tears; NSC nuclear sclerotic cataract; PSC posterior subcapsular cataract; ERM epi-retinal membrane; PVD posterior vitreous detachment; RD retinal detachment; DM diabetes mellitus; DR diabetic retinopathy; NPDR non-proliferative diabetic retinopathy; PDR proliferative diabetic retinopathy; CSME clinically significant macular edema; DME diabetic macular edema; dbh dot blot hemorrhages; CWS cotton wool spot; POAG primary open angle glaucoma; C/D cup-to-disc ratio; HVF humphrey visual field; GVF goldmann visual field; OCT optical coherence tomography; IOP intraocular pressure; BRVO Branch retinal vein occlusion; CRVO central retinal vein occlusion; CRAO central retinal artery occlusion; BRAO branch retinal artery occlusion; RT retinal tear; SB scleral buckle; PPV pars plana vitrectomy; VH Vitreous hemorrhage; PRP panretinal laser photocoagulation; IVK intravitreal kenalog; VMT vitreomacular traction; MH Macular hole;  NVD neovascularization of the disc; NVE neovascularization elsewhere; AREDS age related eye  disease study; ARMD age related macular degeneration; POAG primary open angle glaucoma; EBMD epithelial/anterior basement membrane dystrophy; ACIOL anterior chamber intraocular lens; IOL intraocular lens; PCIOL posterior chamber intraocular lens; Phaco/IOL phacoemulsification with intraocular lens placement; PRK photorefractive keratectomy; LASIK laser assisted in situ keratomileusis; HTN hypertension; DM diabetes mellitus; COPD chronic obstructive pulmonary disease

## 2022-12-13 ENCOUNTER — Encounter (INDEPENDENT_AMBULATORY_CARE_PROVIDER_SITE_OTHER): Payer: Self-pay | Admitting: Ophthalmology

## 2022-12-13 ENCOUNTER — Ambulatory Visit (INDEPENDENT_AMBULATORY_CARE_PROVIDER_SITE_OTHER): Payer: 59 | Admitting: Ophthalmology

## 2022-12-13 DIAGNOSIS — H318 Other specified disorders of choroid: Secondary | ICD-10-CM

## 2022-12-13 DIAGNOSIS — H353221 Exudative age-related macular degeneration, left eye, with active choroidal neovascularization: Secondary | ICD-10-CM

## 2022-12-13 DIAGNOSIS — H35033 Hypertensive retinopathy, bilateral: Secondary | ICD-10-CM

## 2022-12-13 DIAGNOSIS — H40053 Ocular hypertension, bilateral: Secondary | ICD-10-CM

## 2022-12-13 DIAGNOSIS — H353112 Nonexudative age-related macular degeneration, right eye, intermediate dry stage: Secondary | ICD-10-CM

## 2022-12-13 DIAGNOSIS — H33102 Unspecified retinoschisis, left eye: Secondary | ICD-10-CM

## 2022-12-13 DIAGNOSIS — Z7985 Long-term (current) use of injectable non-insulin antidiabetic drugs: Secondary | ICD-10-CM

## 2022-12-13 DIAGNOSIS — I10 Essential (primary) hypertension: Secondary | ICD-10-CM

## 2022-12-13 DIAGNOSIS — Z7984 Long term (current) use of oral hypoglycemic drugs: Secondary | ICD-10-CM

## 2022-12-13 DIAGNOSIS — E119 Type 2 diabetes mellitus without complications: Secondary | ICD-10-CM

## 2022-12-13 DIAGNOSIS — Z961 Presence of intraocular lens: Secondary | ICD-10-CM

## 2022-12-13 MED ORDER — AFLIBERCEPT 2MG/0.05ML IZ SOLN FOR KALEIDOSCOPE
2.0000 mg | INTRAVITREAL | Status: AC | PRN
Start: 2022-12-13 — End: 2022-12-13
  Administered 2022-12-13: 2 mg via INTRAVITREAL

## 2023-01-31 ENCOUNTER — Encounter (INDEPENDENT_AMBULATORY_CARE_PROVIDER_SITE_OTHER): Payer: 59 | Admitting: Ophthalmology

## 2023-03-21 ENCOUNTER — Ambulatory Visit (HOSPITAL_COMMUNITY)
Admission: EM | Admit: 2023-03-21 | Discharge: 2023-03-21 | Disposition: A | Discharge status: Home / Self Care | Payer: 59 | Attending: Emergency Medicine | Admitting: Emergency Medicine

## 2023-03-21 ENCOUNTER — Encounter (HOSPITAL_COMMUNITY): Payer: Self-pay

## 2023-03-21 DIAGNOSIS — M51362 Other intervertebral disc degeneration, lumbar region with discogenic back pain and lower extremity pain: Secondary | ICD-10-CM

## 2023-03-21 DIAGNOSIS — M79604 Pain in right leg: Secondary | ICD-10-CM | POA: Diagnosis not present

## 2023-03-21 MED ORDER — IBUPROFEN 600 MG PO TABS: 600.0000 mg | ORAL_TABLET | Freq: Three times a day (TID) | ORAL | 0 refills | Status: AC | PRN

## 2023-03-21 MED ORDER — IBUPROFEN 800 MG PO TABS
800.0000 mg | ORAL_TABLET | Freq: Once | ORAL | Status: AC
Start: 2023-03-21 — End: 2023-03-21
  Administered 2023-03-21: 800 mg via ORAL

## 2023-03-21 MED ORDER — IBUPROFEN 800 MG PO TABS
ORAL_TABLET | ORAL | Status: AC
  Filled 2023-03-21: qty 1

## 2023-03-21 NOTE — ED Provider Notes (Signed)
 [facility]    CSN: [id number] Arrival date & time: 03/21/23  1434    HISTORY   Chief Complaint  Patient presents with   Leg Pain   HPI Kylie Wilson is a pleasant, 73 y.o. female who presents to urgent care today. The patient complains of right thigh pain radiating up to her right lower back for the past 3 days.  Patient states she can feel a knot in her right thigh as well.  Patient states she hit her right thigh on the edge of her bed a a few weeks ago, states she has been taking Tylenol  and aspirin  without meaningful relief.  Patient endorses history of blood clots in the past, states this feels different because the pain is not constant, it only occurs when she is standing up and/or walking.  When asked, patient states the pain is not radiating from her lower back down to her right thigh but instead from her right thigh up to her lower back.  Patient advised that I do see that she has a history of degenerative disc disease in her lumbar spine, patient states she does not believe she is having trouble with her back, only her right thigh.  The history is provided by the patient.   Past Medical History:  Diagnosis Date   Arthritis    Barrett's esophagus    Burning pain    Cervical radiculopathy    Cervical spondylosis    Coronary artery calcification    DDD (degenerative disc disease), cervical    Diabetes mellitus    type 2    GERD (gastroesophageal reflux disease)    Hyperlipidemia    Hypertension    Hypertensive retinopathy    OU   Macular degeneration    Dry OD; Wet OS   Obesity    Rectocele    Sleep apnea    does not tolerate cpap   Uterine prolapse    Patient Active Problem List   Diagnosis Date Noted   Osteoarthritis of thoracic spine 07/21/2020   Coronary artery calcification seen on CT scan 07/10/2020   Cervical radiculopathy 11/13/2019   Centrilobular emphysema (HCC) 11/06/2019   Pseudophakia of both eyes 09/18/2019   Myopia of both  eyes 09/18/2019   Retinal edema 09/18/2019   Schatzki's ring 07/13/2019   Palpitations 05/22/2019   Fatigue 05/22/2019   Chest pain with moderate risk of acute coronary syndrome 04/27/2019   Chronic low back pain without sciatica 02/22/2019   Paresthesia 02/22/2019   PCO (posterior capsular opacification), left 11/14/2018   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 associated with type 2 diabetes mellitus (HCC) 09/14/2018   Bilateral ocular hypertension 08/18/2018   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   Lumbar disc disease 08/17/2018   Wheezing 08/01/2017   Diverticulosis of large intestine without hemorrhage 05/06/2017   History of adenomatous polyp of colon 04/25/2017   Former smoker 03/04/2017   Vocal cord polyp 01/13/2017   Vitamin D deficiency 10/15/2016   Refused pneumococcal vaccination 10/13/2016   Generalized osteoarthritis of multiple sites 07/16/2016   Low back pain at multiple sites 07/16/2016   Cervicalgia 07/16/2016   Chronic pain disorder 07/16/2016   COPD (chronic obstructive pulmonary disease) with chronic bronchitis (HCC) 04/11/2016   Type 2 diabetes mellitus with mild nonproliferative retinopathy of both eyes, without long-term current use of insulin (HCC) 02/17/2016   COPD (chronic obstructive pulmonary disease) (HCC) 02/17/2016   Obesity due to excess calories 02/17/2016   Chronic  idiopathic constipation 03/20/2015   Morbid obesity with BMI of 45.0-49.9, adult (HCC) 03/20/2015   Constipation 09/22/2011   Background diabetic retinopathy (HCC) 09/09/2011   Exudative macular degeneration (HCC) 09/09/2011   Dyslipidemia, goal LDL below 70 08/27/2009   DEPRESSION 08/27/2009   Essential hypertension 08/27/2009   Sleep apnea 08/27/2009   DYSPNEA 08/27/2009   Major depressive disorder, single episode 08/27/2009   BARRETTS ESOPHAGUS 08/07/2009   GERD (gastroesophageal reflux disease) 08/07/2008   COUGH  08/07/2008   Diverticulosis of colon 11/17/2007   Past Surgical History:  Procedure Laterality Date   BREAST BIOPSY  1980   left   BREAST EXCISIONAL BIOPSY Left    Bening 1980's   CATARACT EXTRACTION Bilateral    Dr. Charmayne   EYE SURGERY Bilateral    Cat Sx - Dr. Charmayne RAVENS REVISION  2001   both legs   TOE SURGERY  1981   4th and 5th toe right foot   TOE SURGERY  2002   great toe left   TONSILLECTOMY     TUBAL LIGATION     OB History   No obstetric history on file.    Home Medications    Prior to Admission medications   Medication Sig Start Date End Date Taking? Authorizing Provider  ibuprofen  (ADVIL ) 600 MG tablet Take 1 tablet (600 mg total) by mouth every 8 (eight) hours as needed for up to 30 doses for fever, headache, mild pain (pain score 1-3) or moderate pain (pain score 4-6) (Inflammation). Take 1 tablet 3 times daily as needed for inflammation of upper airways and/or pain. 03/21/23  Yes Joesph Shaver Scales, PA-C  ACCU-CHEK AVIVA PLUS test strip USE TO TEST BLOOD SUGARS DAILY. 05/27/17   Jordan, Betty G, MD  amlodipine -olmesartan (AZOR) 10-20 MG tablet Take 1 tablet by mouth daily. 02/06/19   [provider]  aspirin  EC 81 MG tablet Take 1 tablet (81 mg total) by mouth daily. Swallow whole. 02/09/21   Tolia, Sunit, DO  Blood Glucose Monitoring Suppl (GLUCOCOM BLOOD GLUCOSE MONITOR) DEVI One Touch Verio Meter. FSBS BID. E11.319 06/06/17   [provider]  Bromfenac  Sodium 0.07 % SOLN INSTILL 1 DROP IN BOTH EYES FOUR TIMES DAILY 07/29/22   Valdemar Rogue, MD  Cholecalciferol (VITAMIN D3 PO) Take 5,000 Units by mouth daily.    [provider]  dorzolamide -timolol  (COSOPT ) 2-0.5 % ophthalmic solution Place 1 drop into both eyes 2 (two) times daily. 05/07/22   Valdemar Rogue, MD  dorzolamide -timolol  (COSOPT ) 22.3-6.8 MG/ML ophthalmic solution Place 1 drop into both eyes 2 (two) times daily. 02/07/19   [provider]  empagliflozin  (JARDIANCE )  25 MG TABS tablet Take 1 tablet by mouth daily. 05/05/20   [provider]  hydrochlorothiazide  (HYDRODIURIL ) 25 MG tablet TAKE 1 TABLET BY MOUTH EVERY MORNING. 09/27/22   Tolia, Sunit, DO  Lancets (ONETOUCH DELICA PLUS [place]) MISC Apply topically. 02/03/20   [provider]  LINZESS  72 MCG capsule Take 72 mcg by mouth daily as needed (abdominal cramping).  03/13/19   [provider]  metFORMIN  (GLUCOPHAGE ) 500 MG tablet Take 1,000 mg by mouth 2 (two) times daily with a meal.  09/14/18   [provider]  metoprolol  succinate (TOPROL -XL) 25 MG 24 hr tablet TAKE 1 TAB EVERY MORNING. HOLD IF SYSTOLIC BLOOD PRESSURE (TOP NUMBER) <100 MMHG OR PULSE <60 BPM 06/23/21   Tolia, Sunit, DO  omeprazole (PRILOSEC) 40 MG capsule Take 40 mg by mouth every morning. 05/07/21  [provider]  pregabalin (LYRICA) 75 MG capsule Take by mouth. 04/23/21   [provider]  rosuvastatin  (CRESTOR ) 10 MG tablet Take 1 tablet (10 mg total) by mouth at bedtime for 90 doses. 07/13/19 11/09/21  Michele Richardson, DO    Family History Family History  Problem Relation Age of Onset   Heart disease Father    Pancreatic cancer Father    Breast cancer Paternal Aunt    Stroke Mother    Heart disease Brother    Colon cancer Neg Hx    Stomach cancer Neg Hx    Colon polyps Neg Hx    Rectal cancer Neg Hx    Social History Social History   Tobacco Use   Smoking status: Former    Current packs/day: 0.00    Average packs/day: 0.8 packs/day for 30.0 years (24.0 ttl pk-yrs)    Types: Cigarettes    Start date: 09/14/1981    Quit date: 09/15/2011    Years since quitting: 11.5   Smokeless tobacco: Never   Tobacco comments:    smoked 20 years; then quit 10; smoked 30 years plus  Vaping Use   Vaping status: Never Used  Substance Use Topics   Alcohol use: Yes    Comment: Every evening, 3 glasses of wine    Drug use: No   Allergies   Duloxetine  and Lisinopril  Review of  Systems Review of Systems Pertinent findings revealed after performing a 14 point review of systems has been noted in the history of present illness.  Physical Exam Vital Signs BP (!) 145/75 (BP Location: Left Arm)   Pulse 63   Temp 98.2 F (36.8 C) (Oral)   Resp 18   SpO2 97%   No data found.  Physical Exam Vitals and nursing note reviewed.  Constitutional:      General: She is awake. She is not in acute distress.    Appearance: Normal appearance. She is well-developed and well-groomed. She is morbidly obese.  HENT:     Head: Normocephalic and atraumatic.  Eyes:     Pupils: Pupils are equal, round, and reactive to light.  Cardiovascular:     Rate and Rhythm: Normal rate and regular rhythm.  Pulmonary:     Effort: Pulmonary effort is normal.     Breath sounds: Normal breath sounds.  Musculoskeletal:        General: Normal range of motion.     Cervical back: Normal range of motion and neck supple.     Right upper leg: No swelling, edema, deformity, lacerations, tenderness or bony tenderness.  Skin:    General: Skin is warm and dry.  Neurological:     General: No focal deficit present.     Mental Status: She is alert and oriented to person, place, and time. Mental status is at baseline.  Psychiatric:        Mood and Affect: Mood normal.        Behavior: Behavior normal. Behavior is cooperative.        Thought Content: Thought content normal.        Judgment: Judgment normal.     UC Couse / Diagnostics / Procedures:     Radiology No results found.  Procedures Procedures (including critical care time) EKG  Pending results:  Labs Reviewed - No data to display  Medications Ordered in UC: Medications  ibuprofen  (ADVIL ) tablet 800 mg (has no administration in time range)    UC Diagnoses / Final Clinical Impressions(s)  I have reviewed the triage vital signs and the nursing notes.  Pertinent labs & imaging results that were available during my care of the  patient were reviewed by me and considered in my medical decision making (see chart for details).    Final diagnoses:  Degeneration of intervertebral disc of lumbar region with discogenic back pain and lower extremity pain  Right leg pain   Physical exam findings limited due to patient unwilling to perform maneuvers for evaluation of lower back.  Patient is morbidly obese and the lumps to which she is referring in her right thigh are also present in her left thigh and are likely related to cellulitis.  Patient does not have any focal tenderness on either leg.  Given that patient only has pain when standing, not persistently, have low suspicion for blood clot at this time.  Patient states she takes a baby aspirin  daily.  Patient advised to apply warm compress to the area that is sore and was provided with prescription for ibuprofen  600 mg, patient politely declined offer for injection of ketorolac  for rapid relief of her pain.    Conservative care recommended.  Return precautions advised.  Emergency precautions advised.  Please see discharge instructions below for details of plan of care as provided to patient. ED Prescriptions     Medication Sig Dispense Auth. Provider   ibuprofen  (ADVIL ) 600 MG tablet Take 1 tablet (600 mg total) by mouth every 8 (eight) hours as needed for up to 30 doses for fever, headache, mild pain (pain score 1-3) or moderate pain (pain score 4-6) (Inflammation). Take 1 tablet 3 times daily as needed for inflammation of upper airways and/or pain. 30 tablet Joesph Shaver Scales, PA-C      PDMP not reviewed this encounter.    Discharge Instructions      For continued pain relief, please continue ibuprofen  600 mg tablets every 6-8 hours as needed.  I also recommend that you apply warm compress to the right side of your leg for pain relief.  You can do this for 20 minutes 3-4 times daily.  If your pain does not improve in the next 3 to 4 days, please reach out to your  primary care provider to discuss whether or not ultrasound or MRI imaging is needed.  Thank you for visiting [facility] today.      Disposition Upon Discharge:  Condition: stable for discharge home Home: take medications as prescribed; routine discharge instructions as discussed; follow up as advised.  Patient presented with an acute illness with associated systemic symptoms and significant discomfort requiring urgent management. In my opinion, this is a condition that a prudent lay person (someone who possesses an average knowledge of health and medicine) may potentially expect to result in complications if not addressed urgently such as respiratory distress, impairment of bodily function or dysfunction of bodily organs.   Routine symptom specific, illness specific and/or disease specific instructions were discussed with the patient and/or caregiver at length.   As such, the patient has been evaluated and assessed, work-up was performed and treatment was provided in alignment with urgent care protocols and evidence based medicine.  Patient/parent/caregiver has been advised that the patient may require follow up for further testing and treatment if the symptoms continue in spite of treatment, as clinically indicated and appropriate.  Patient/parent/caregiver has been advised to report to orthopedic urgent care clinic or return to the [facility] or PCP in 3-5 days if no better; follow-up with orthopedics,  PCP or the Emergency Department if new signs and symptoms develop or if the current signs or symptoms continue to change or worsen for further workup, evaluation and treatment as clinically indicated and appropriate  The patient will follow up with their current PCP if and as advised. If the patient does not currently have a PCP we will have assisted them in obtaining one.   The patient may need specialty follow up if the symptoms continue, in spite of conservative treatment and  management, for further workup, evaluation, consultation and treatment as clinically indicated and appropriate.  Patient/parent/caregiver verbalized understanding and agreement of plan as discussed.  All questions were addressed during visit.  Please see discharge instructions below for further details of plan.  This office note has been dictated using Teaching laboratory technician.  Unfortunately, this method of dictation can sometimes lead to typographical or grammatical errors.  I apologize for your inconvenience in advance if this occurs.  Please do not hesitate to reach out to me if clarification is needed.      Joesph Shaver Scales, PA-C 03/21/23 1555

## 2023-03-21 NOTE — Discharge Instructions (Signed)
 For continued pain relief, please continue ibuprofen  600 mg tablets every 6-8 hours as needed.  I also recommend that you apply warm compress to the right side of your leg for pain relief.  You can do this for 20 minutes 3-4 times daily.  If your pain does not improve in the next 3 to 4 days, please reach out to your primary care provider to discuss whether or not ultrasound or MRI imaging is needed.  Thank you for visiting [facility] today.

## 2023-03-21 NOTE — ED Triage Notes (Addendum)
 Pt c/o lt thigh pain radiating up to lower back since Friday. States she can feel a knot to rt thigh. States she did hit her rt thigh on her bed a while back. States took tylenol and asa with no relief.

## 2023-03-28 ENCOUNTER — Emergency Department (HOSPITAL_COMMUNITY)
Admission: EM | Admit: 2023-03-28 | Discharge: 2023-03-28 | Disposition: A | Discharge status: Home / Self Care | Payer: 59 | Attending: Emergency Medicine | Admitting: Emergency Medicine

## 2023-03-28 ENCOUNTER — Encounter (HOSPITAL_COMMUNITY): Payer: Self-pay | Admitting: Emergency Medicine

## 2023-03-28 ENCOUNTER — Other Ambulatory Visit: Payer: Self-pay

## 2023-03-28 ENCOUNTER — Ambulatory Visit (HOSPITAL_COMMUNITY): Payer: 59

## 2023-03-28 DIAGNOSIS — Z79899 Other long term (current) drug therapy: Secondary | ICD-10-CM | POA: Insufficient documentation

## 2023-03-28 DIAGNOSIS — I10 Essential (primary) hypertension: Secondary | ICD-10-CM | POA: Diagnosis not present

## 2023-03-28 DIAGNOSIS — I2510 Atherosclerotic heart disease of native coronary artery without angina pectoris: Secondary | ICD-10-CM | POA: Insufficient documentation

## 2023-03-28 DIAGNOSIS — J449 Chronic obstructive pulmonary disease, unspecified: Secondary | ICD-10-CM | POA: Diagnosis not present

## 2023-03-28 DIAGNOSIS — E119 Type 2 diabetes mellitus without complications: Secondary | ICD-10-CM | POA: Insufficient documentation

## 2023-03-28 DIAGNOSIS — M79604 Pain in right leg: Secondary | ICD-10-CM

## 2023-03-28 DIAGNOSIS — Z7984 Long term (current) use of oral hypoglycemic drugs: Secondary | ICD-10-CM | POA: Insufficient documentation

## 2023-03-28 DIAGNOSIS — Z7982 Long term (current) use of aspirin: Secondary | ICD-10-CM | POA: Diagnosis not present

## 2023-03-28 MED ORDER — LIDOCAINE 5 % EX PTCH
2.0000 | MEDICATED_PATCH | CUTANEOUS | Status: DC
  Administered 2023-03-28: 2 via TRANSDERMAL
  Filled 2023-03-28: qty 2

## 2023-03-28 MED ORDER — PREDNISONE 10 MG (21) PO TBPK: ORAL_TABLET | Freq: Every day | ORAL | 0 refills | Status: AC

## 2023-03-28 MED ORDER — METHOCARBAMOL 500 MG PO TABS
500.0000 mg | ORAL_TABLET | Freq: Once | ORAL | Status: AC
  Administered 2023-03-28: 500 mg via ORAL
  Filled 2023-03-28: qty 1

## 2023-03-28 MED ORDER — METHOCARBAMOL 500 MG PO TABS: 500.0000 mg | ORAL_TABLET | Freq: Two times a day (BID) | ORAL | 0 refills | Status: AC

## 2023-03-28 NOTE — ED Provider Notes (Signed)
 [facility] Provider Note   CSN: [id number] Arrival date & time: 03/28/23  9160     History  Chief Complaint  Patient presents with   Leg Pain    Kylie Wilson is a 73 y.o. female with past medical history of HLD, HTN, CAD, GERD, Barrett's, diverticulosis, sleep apnea, T2DM, COPD, lumbar disc disease presents emergency department for evaluation of right thigh pain for the past 10 days.  She describes pain as shooting that starts in right thigh and goes up her right lower back.  She reports that she hit her right thigh on the edge of her bed a few weeks ago but has been able to ambulate following this.  She was seen at urgent care for similar symptoms and provided ibuprofen  and symptomatic care which she reports has not alleviated pain.  She normally walks with a cane at night but has been using it throughout the day now secondary to pain and feeling unstable.  She does have a history of clots but is not on any thinners.  She denies recent travel, immobilization, leg swelling, paresthesia, CP, SOB.  Leg Pain Associated symptoms: no fatigue and no fever      Home Medications Prior to Admission medications   Medication Sig Start Date End Date Taking? Authorizing Provider  methocarbamol  (ROBAXIN ) 500 MG tablet Take 1 tablet (500 mg total) by mouth 2 (two) times daily for 7 days. 03/28/23 04/04/23 Yes Minnie Tinnie BRAVO, PA  predniSONE  (STERAPRED UNI-PAK 21 TAB) 10 MG (21) TBPK tablet Take by mouth daily. Take 6 tabs by mouth daily  for 2 days, then 5 tabs for 2 days, then 4 tabs for 2 days, then 3 tabs for 2 days, 2 tabs for 2 days, then 1 tab by mouth daily for 2 days 03/28/23  Yes Minnie, Ismelda Weatherman E, PA  ACCU-CHEK AVIVA PLUS test strip USE TO TEST BLOOD SUGARS DAILY. 05/27/17   Jordan, Betty G, MD  amlodipine -olmesartan (AZOR) 10-20 MG tablet Take 1 tablet by mouth daily. 02/06/19   [provider]  aspirin  EC 81 MG tablet Take 1 tablet  (81 mg total) by mouth daily. Swallow whole. 02/09/21   Tolia, Sunit, DO  Blood Glucose Monitoring Suppl (GLUCOCOM BLOOD GLUCOSE MONITOR) DEVI One Touch Verio Meter. FSBS BID. E11.319 06/06/17   [provider]  Bromfenac  Sodium 0.07 % SOLN INSTILL 1 DROP IN BOTH EYES FOUR TIMES DAILY 07/29/22   Valdemar Rogue, MD  Cholecalciferol (VITAMIN D3 PO) Take 5,000 Units by mouth daily.    [provider]  dorzolamide -timolol  (COSOPT ) 2-0.5 % ophthalmic solution Place 1 drop into both eyes 2 (two) times daily. 05/07/22   Valdemar Rogue, MD  dorzolamide -timolol  (COSOPT ) 22.3-6.8 MG/ML ophthalmic solution Place 1 drop into both eyes 2 (two) times daily. 02/07/19   [provider]  empagliflozin  (JARDIANCE ) 25 MG TABS tablet Take 1 tablet by mouth daily. 05/05/20   [provider]  hydrochlorothiazide  (HYDRODIURIL ) 25 MG tablet TAKE 1 TABLET BY MOUTH EVERY MORNING. 09/27/22   Tolia, Sunit, DO  ibuprofen  (ADVIL ) 600 MG tablet Take 1 tablet (600 mg total) by mouth every 8 (eight) hours as needed for up to 30 doses for fever, headache, mild pain (pain score 1-3) or moderate pain (pain score 4-6) (Inflammation). Take 1 tablet 3 times daily as needed for inflammation of upper airways and/or pain. 03/21/23   Joesph Shaver Scales, PA-C  Lancets [facility] DELICA PLUS Salome) MISC Apply topically. 02/03/20  [provider]  LINZESS  72 MCG capsule Take 72 mcg by mouth daily as needed (abdominal cramping).  03/13/19   [provider]  metFORMIN  (GLUCOPHAGE ) 500 MG tablet Take 1,000 mg by mouth 2 (two) times daily with a meal.  09/14/18   [provider]  metoprolol  succinate (TOPROL -XL) 25 MG 24 hr tablet TAKE 1 TAB EVERY MORNING. HOLD IF SYSTOLIC BLOOD PRESSURE (TOP NUMBER) <100 MMHG OR PULSE <60 BPM 06/23/21   Tolia, Sunit, DO  omeprazole (PRILOSEC) 40 MG capsule Take 40 mg by mouth every morning. 05/07/21   [provider]  pregabalin (LYRICA) 75 MG capsule  Take by mouth. 04/23/21   [provider]  rosuvastatin  (CRESTOR ) 10 MG tablet Take 1 tablet (10 mg total) by mouth at bedtime for 90 doses. 07/13/19 11/09/21  Tolia, Sunit, DO      Allergies    Duloxetine  and Lisinopril    Review of Systems   Review of Systems  Constitutional:  Negative for chills, fatigue and fever.  Respiratory:  Negative for cough, chest tightness, shortness of breath and wheezing.   Cardiovascular:  Negative for chest pain and palpitations.  Gastrointestinal:  Negative for abdominal pain, constipation, diarrhea, nausea and vomiting.  Musculoskeletal:  Positive for arthralgias.  Neurological:  Negative for dizziness, seizures, weakness, light-headedness, numbness and headaches.    Physical Exam Updated Vital Signs BP (!) 167/72 (BP Location: Right Arm)   Pulse (!) 58   Temp 97.9 F (36.6 C)   Resp 16   SpO2 96%  Physical Exam Vitals and nursing note reviewed.  Constitutional:      General: She is not in acute distress.    Appearance: Normal appearance.  HENT:     Head: Normocephalic and atraumatic.  Eyes:     Conjunctiva/sclera: Conjunctivae normal.  Cardiovascular:     Rate and Rhythm: Normal rate.     Pulses:          Dorsalis pedis pulses are 2+ on the right side and 2+ on the left side.  Pulmonary:     Effort: Pulmonary effort is normal. No respiratory distress.  Musculoskeletal:     Comments: TTP of right posterior calf and right anterior thigh.  No bony tenderness.  No difficulty with ankle, knee, hip flexion or extension.  Straight leg negative LLE but possibly + in RLE as it may be d/t R hip bursitis Ambulates with cane and no obvious limp  Skin:    General: Skin is warm.     Capillary Refill: Capillary refill takes less than 2 seconds.     Coloration: Skin is not jaundiced or pale.     Comments: No overlying skin changes of warmth, erythremia, streaking.  No pallor nor cold extremity. No swelling to BLE  Neurological:     Mental  Status: She is alert and oriented to person, place, and time. Mental status is at baseline.     GCS: GCS eye subscore is 4. GCS verbal subscore is 5. GCS motor subscore is 6.     Comments: Follows commands appropriately 5/5 motor in BLE. Sensation intact of BLE dermatomes L2-S2    ED Results / Procedures / Treatments   Labs (all labs ordered are listed, but only abnormal results are displayed) Labs Reviewed - No data to display  EKG None  Radiology VAS US  LOWER EXTREMITY VENOUS (DVT) (ONLY [facility] & [facility]) Result Date: 03/28/2023  Lower Venous DVT Study Patient Name:  ROMERO LANG [place]  Date of Exam:  03/28/2023 Medical Rec #: [id number]               Accession #:    [id number] Date of Birth: 1950-10-23                Patient Gender: F Patient Age:   36 years Exam Location:  [facility] Procedure:      VAS US  LOWER EXTREMITY VENOUS (DVT) Referring Phys: TINNIE MATTER --------------------------------------------------------------------------------  Indications: Lateral thigh pain that radiates to back.  Limitations: Body habitus and Unable to tolerate compressions, holding breath secondary to pain. Comparison Study: No prior study on file Performing Technologist: Alberta Lis RVS  Examination Guidelines: A complete evaluation includes B-mode imaging, spectral Doppler, color Doppler, and power Doppler as needed of all accessible portions of each vessel. Bilateral testing is considered an integral part of a complete examination. Limited examinations for reoccurring indications may be performed as noted. The reflux portion of the exam is performed with the patient in reverse Trendelenburg.  +---------+---------------+---------+-----------+----------+-------------------+ RIGHT    CompressibilityPhasicitySpontaneityPropertiesThrombus Aging      +---------+---------------+---------+-----------+----------+-------------------+ CFV                     Yes      Yes                  patent by  color and                                                       Doppler             +---------+---------------+---------+-----------+----------+-------------------+ FV Prox                 Yes      Yes                  patent by color and                                                       Doppler             +---------+---------------+---------+-----------+----------+-------------------+ FV Mid   Full           Yes      No                                       +---------+---------------+---------+-----------+----------+-------------------+ FV Distal               Yes      No                   patent by color and                                                       Doppler             +---------+---------------+---------+-----------+----------+-------------------+ PFV  Yes      No                   patent by color and                                                       Doppler             +---------+---------------+---------+-----------+----------+-------------------+ POP      Full           Yes      Yes                                      +---------+---------------+---------+-----------+----------+-------------------+ PTV      Full                                                             +---------+---------------+---------+-----------+----------+-------------------+ PERO                                                  Not well visualized +---------+---------------+---------+-----------+----------+-------------------+ Gastroc  Full                                                             +---------+---------------+---------+-----------+----------+-------------------+   +----+---------------+---------+-----------+----------+--------------+ LEFTCompressibilityPhasicitySpontaneityPropertiesThrombus Aging +----+---------------+---------+-----------+----------+--------------+ CFV Full            Yes      Yes                                 +----+---------------+---------+-----------+----------+--------------+    Summary: RIGHT: - There is no evidence of deep vein thrombosis in the lower extremity. However, portions of this examination were limited- see technologist comments above.  - No cystic structure found in the popliteal fossa.  LEFT: - No evidence of common femoral vein obstruction.   *See table(s) above for measurements and observations.    Preliminary     Procedures Procedures    Medications Ordered in ED Medications  lidocaine  (LIDODERM ) 5 % 2 patch (2 patches Transdermal Patch Applied 03/28/23 1013)  lidocaine  (LIDODERM ) 5 % 2 patch (has no administration in time range)  methocarbamol  (ROBAXIN ) tablet 500 mg (has no administration in time range)    ED Course/ Medical Decision Making/ A&P                                 Medical Decision Making Risk Prescription drug management.   Patient presents to the ED for concern of RLE pain for past 10 days, this involves an extensive number of treatment options, and is a complaint that carries with it a  high risk of complications and morbidity.  The differential diagnosis includes sciatica, fracture, strain, DVT, infection   Co morbidities that complicate the patient evaluation  HLD, HTN, GERD, Barrett's, diverticulosis, sleep apnea, CAD, T2DM, COPD, lumbar disc disease    Additional history obtained:  Additional history obtained from Nursing and Outside Medical Records   External records from outside source obtained and reviewed including  Triage RN note Urgent care note from 03/21/2023 Most recent MRI lumbar spine which showed L4-5 due to disc bulging and facet hypertrophy with no spinal stenosis nor foraminal narrowing L5-S1 with disc bulging with no spinal stenosis or foraminal narrowing    Medicines ordered and prescription drug management:  I ordered medication including lidocaine  patches, robaxin ,  prednisone   for pain  Reevaluation of the patient after these medicines showed that the patient improved I have reviewed the patients home medicines and have made adjustments as needed    Problem List / ED Course:  RLE pain Pain is reproducible and present while resting as well as ambulating Will obtain DVT study as patient has history of clots in the past rule out DVT or Baker's cyst as a sign for symptoms DVT study negative As pain is reproducible with palpation, likely muscle strain or related to sciatica with extensive history of disc herniation Will provide Robaxin  and prednisone  for symptoms as patient is satisfied with ibuprofen  at this time. Discussed Robaxin  can make you drowsy and do not drink alcohol nor drive heavy machinery while taking Discussed that steroids may increase sugars temporarily and to monitor them closely Discussed follow up with PCP if symptoms persist for possible advanced imaging of spine or further management Discussed return to emergency department precautions with patient expresses understanding agrees with plan.  All questions answered to her satisfaction.  She is agreeable to discharge. HTN Hx and controlled with Azor and hydrochlorothiazide  Hypertensive likely 2/2 pain and ED visit Pt reports compliance with meds and will check with PCP regarding BP at next f/u  Reevaluation:  After the interventions noted above, I reevaluated the patient and found that they have :improved   Social Determinants of Health:  Has PCP follow-up   Dispostion:  After consideration of the diagnostic results and the patients response to treatment, I feel that the patent would benefit from outpatient management.    Final Clinical Impression(s) / ED Diagnoses Final diagnoses:  Right leg pain    Rx / DC Orders ED Discharge Orders          Ordered    predniSONE  (STERAPRED UNI-PAK 21 TAB) 10 MG (21) TBPK tablet  Daily        03/28/23 1148    methocarbamol   (ROBAXIN ) 500 MG tablet  2 times daily        03/28/23 1148             Minnie Tinnie BRAVO, [place] 03/28/23 1201    Armenta Canning, MD 04/06/23 1402

## 2023-03-28 NOTE — Progress Notes (Signed)
 VASCULAR LAB    Right lower extremity venous duplex has been performed.  See CV proc for preliminary results.  Messaged negative results to Sabra Heck, PA via secure chat  Anzley Dibbern, RVT 03/28/2023, 11:10 AM

## 2023-03-28 NOTE — Discharge Instructions (Addendum)
 Thank you for letting us  evaluate you today.  Your ultrasound was negative for a clot in your leg.  The pain may be due to sciatica or muscle strain.  Please continue using ibuprofen  for pain.  I have also given you muscle relaxer here in emergency department as well as prescription for muscle relaxer and steroid burst if this is related to sciatica pain.  Steroids might increase your sugar so monitor closely. please make sure to do the attached stretches.  If symptoms do not improve, please follow-up with your PCP following steroids if imaging of your back or further management is required.  Return to emergency department if you experience urinary incontinence, significant worsening of pain, inability to walk, altered mentation, sugars greater than 300-400

## 2023-03-28 NOTE — ED Notes (Signed)
 This RN reviewed discharge instructions with patient. She verbalized understanding and denied any further questions. PT well appearing upon discharge and reports tolerable pain. Pt ambulated with stable gait to exit. Pt endorses ride home.

## 2023-03-28 NOTE — ED Triage Notes (Addendum)
 Pt reports right anterior thigh pain x 1.5 weeks. Pt reports the pain "shoots" into her right lower back. Denies injury to the area. Reports pain is worse when standing.

## 2024-01-04 ENCOUNTER — Other Ambulatory Visit (INDEPENDENT_AMBULATORY_CARE_PROVIDER_SITE_OTHER): Payer: Self-pay | Admitting: Ophthalmology

## 2024-01-10 ENCOUNTER — Other Ambulatory Visit (INDEPENDENT_AMBULATORY_CARE_PROVIDER_SITE_OTHER): Payer: Self-pay
